# Patient Record
Sex: Male | Born: 1945 | Race: White | Hispanic: No | Marital: Married | Smoking: Former smoker
Health system: Southern US, Community
[De-identification: ages and names within clinical notes are randomized; demographics above are authoritative.]

## PROBLEM LIST (undated history)

## (undated) DIAGNOSIS — I10 Essential (primary) hypertension: Secondary | ICD-10-CM

## (undated) DIAGNOSIS — F329 Major depressive disorder, single episode, unspecified: Secondary | ICD-10-CM

## (undated) DIAGNOSIS — F4310 Post-traumatic stress disorder, unspecified: Secondary | ICD-10-CM

## (undated) DIAGNOSIS — Z95810 Presence of automatic (implantable) cardiac defibrillator: Secondary | ICD-10-CM

## (undated) DIAGNOSIS — F419 Anxiety disorder, unspecified: Secondary | ICD-10-CM

## (undated) DIAGNOSIS — J449 Chronic obstructive pulmonary disease, unspecified: Secondary | ICD-10-CM

## (undated) DIAGNOSIS — R251 Tremor, unspecified: Secondary | ICD-10-CM

## (undated) DIAGNOSIS — N400 Enlarged prostate without lower urinary tract symptoms: Secondary | ICD-10-CM

## (undated) DIAGNOSIS — F32A Depression, unspecified: Secondary | ICD-10-CM

## (undated) DIAGNOSIS — R519 Headache, unspecified: Secondary | ICD-10-CM

## (undated) DIAGNOSIS — K219 Gastro-esophageal reflux disease without esophagitis: Secondary | ICD-10-CM

## (undated) DIAGNOSIS — R51 Headache: Secondary | ICD-10-CM

## (undated) DIAGNOSIS — E785 Hyperlipidemia, unspecified: Secondary | ICD-10-CM

## (undated) HISTORY — PX: BACK SURGERY: SHX140

## (undated) HISTORY — PX: HEMORRHOID SURGERY: SHX153

## (undated) HISTORY — PX: NECK SURGERY: SHX720

## (undated) HISTORY — PX: CATARACT EXTRACTION W/ INTRAOCULAR LENS  IMPLANT, BILATERAL: SHX1307

## (undated) HISTORY — PX: EYE MUSCLE SURGERY: SHX370

## (undated) HISTORY — PX: HERNIA REPAIR: SHX51

## (undated) HISTORY — DX: Presence of automatic (implantable) cardiac defibrillator: Z95.810

---

## 2003-10-01 ENCOUNTER — Other Ambulatory Visit: Payer: Self-pay

## 2003-10-04 ENCOUNTER — Other Ambulatory Visit: Payer: Self-pay

## 2004-02-13 ENCOUNTER — Emergency Department: Payer: Self-pay | Admitting: Internal Medicine

## 2004-02-27 ENCOUNTER — Ambulatory Visit: Payer: Self-pay

## 2004-06-13 ENCOUNTER — Ambulatory Visit: Payer: Self-pay | Admitting: Surgery

## 2004-06-19 ENCOUNTER — Ambulatory Visit: Payer: Self-pay | Admitting: Surgery

## 2004-06-20 ENCOUNTER — Emergency Department: Payer: Self-pay | Admitting: Unknown Physician Specialty

## 2004-09-29 ENCOUNTER — Ambulatory Visit: Payer: Self-pay | Admitting: General Surgery

## 2004-11-27 ENCOUNTER — Ambulatory Visit: Payer: Self-pay | Admitting: General Surgery

## 2004-12-02 ENCOUNTER — Ambulatory Visit: Payer: Self-pay | Admitting: General Surgery

## 2006-12-01 ENCOUNTER — Ambulatory Visit: Payer: Self-pay | Admitting: Unknown Physician Specialty

## 2007-07-05 ENCOUNTER — Other Ambulatory Visit: Payer: Self-pay

## 2007-07-05 ENCOUNTER — Ambulatory Visit: Payer: Self-pay | Admitting: General Surgery

## 2007-07-11 ENCOUNTER — Ambulatory Visit: Payer: Self-pay | Admitting: General Surgery

## 2008-09-07 ENCOUNTER — Ambulatory Visit: Payer: Self-pay | Admitting: Specialist

## 2009-01-16 ENCOUNTER — Ambulatory Visit: Payer: Self-pay | Admitting: Specialist

## 2009-05-15 ENCOUNTER — Ambulatory Visit: Payer: Self-pay | Admitting: Specialist

## 2009-11-09 ENCOUNTER — Observation Stay: Payer: Self-pay | Admitting: *Deleted

## 2009-11-20 ENCOUNTER — Ambulatory Visit: Payer: Self-pay | Admitting: Specialist

## 2010-03-04 ENCOUNTER — Inpatient Hospital Stay: Payer: Self-pay | Admitting: Internal Medicine

## 2010-10-13 ENCOUNTER — Ambulatory Visit: Payer: Self-pay | Admitting: Unknown Physician Specialty

## 2010-10-14 LAB — PATHOLOGY REPORT

## 2010-11-20 ENCOUNTER — Ambulatory Visit: Payer: Self-pay | Admitting: Specialist

## 2011-02-01 ENCOUNTER — Ambulatory Visit: Payer: Self-pay

## 2011-06-04 ENCOUNTER — Inpatient Hospital Stay: Payer: Self-pay | Admitting: Internal Medicine

## 2011-06-04 ENCOUNTER — Ambulatory Visit: Payer: Self-pay | Admitting: Internal Medicine

## 2011-06-04 LAB — BASIC METABOLIC PANEL
Anion Gap: 11 (ref 7–16)
Calcium, Total: 9.3 mg/dL (ref 8.5–10.1)
Co2: 24 mmol/L (ref 21–32)
EGFR (African American): >60
EGFR (Non-African Amer.): 52 — ABNORMAL LOW
Osmolality: 276 (ref 275–301)

## 2011-06-04 LAB — CBC WITH DIFFERENTIAL/PLATELET
Eosinophil %: 1.7 %
HGB: 13 g/dL (ref 13.0–18.0)
Lymphocyte #: 1.1 10*3/uL (ref 1.0–3.6)
MCH: 32.1 pg (ref 26.0–34.0)
MCV: 98 fL (ref 80–100)
Monocyte #: 0.5 10*3/uL (ref 0.0–0.7)
Neutrophil #: 7.1 10*3/uL — ABNORMAL HIGH (ref 1.4–6.5)
Platelet: 190 10*3/uL (ref 150–440)
RBC: 4.04 10*6/uL — ABNORMAL LOW (ref 4.40–5.90)
RDW: 13.3 % (ref 11.5–14.5)
WBC: 8.8 10*3/uL (ref 3.8–10.6)

## 2011-06-04 LAB — URINALYSIS, COMPLETE
Bilirubin,UR: NEGATIVE
Ketone: NEGATIVE
Nitrite: NEGATIVE
Ph: 6 (ref 4.5–8.0)

## 2011-06-04 LAB — CK: CK, Total: 597 U/L — ABNORMAL HIGH (ref 35–232)

## 2011-06-04 LAB — COMPREHENSIVE METABOLIC PANEL
Anion Gap: 13 (ref 7–16)
BUN: 14 mg/dL (ref 7–18)
Bilirubin,Total: 0.6 mg/dL (ref 0.2–1.0)
Calcium, Total: 9.2 mg/dL (ref 8.5–10.1)
Chloride: 101 mmol/L (ref 98–107)
Creatinine: 1.37 mg/dL — ABNORMAL HIGH (ref 0.60–1.30)
EGFR (African American): >60
EGFR (Non-African Amer.): 55 — ABNORMAL LOW
SGOT(AST): 39 U/L — ABNORMAL HIGH (ref 15–37)
SGPT (ALT): 20 U/L
Total Protein: 7.3 g/dL (ref 6.4–8.2)

## 2011-06-04 LAB — CK TOTAL AND CKMB (NOT AT ARMC)
CK, Total: 1486 U/L — ABNORMAL HIGH (ref 35–232)
CK-MB: 10.3 ng/mL — ABNORMAL HIGH (ref 0.5–3.6)

## 2011-06-04 LAB — TROPONIN I: Troponin-I: <0.02 ng/mL

## 2011-06-05 LAB — BASIC METABOLIC PANEL
Anion Gap: 18 — ABNORMAL HIGH (ref 7–16)
Calcium, Total: 9.2 mg/dL (ref 8.5–10.1)
Co2: 20 mmol/L — ABNORMAL LOW (ref 21–32)
EGFR (African American): 48 — ABNORMAL LOW
EGFR (Non-African Amer.): 39 — ABNORMAL LOW
Glucose: 200 mg/dL — ABNORMAL HIGH (ref 65–99)
Osmolality: 284 (ref 275–301)
Potassium: 4.5 mmol/L (ref 3.5–5.1)

## 2011-06-05 LAB — CBC WITH DIFFERENTIAL/PLATELET
Basophil #: 0 10*3/uL (ref 0.0–0.1)
HCT: 36.3 % — ABNORMAL LOW (ref 40.0–52.0)
HGB: 11.9 g/dL — ABNORMAL LOW (ref 13.0–18.0)
Lymphocyte #: 0.7 10*3/uL — ABNORMAL LOW (ref 1.0–3.6)
MCH: 31.9 pg (ref 26.0–34.0)
MCHC: 32.9 g/dL (ref 32.0–36.0)
MCV: 97 fL (ref 80–100)
Monocyte #: 0.2 10*3/uL (ref 0.0–0.7)
Monocyte %: 1.4 %
Neutrophil #: 10.8 10*3/uL — ABNORMAL HIGH (ref 1.4–6.5)
Platelet: 167 10*3/uL (ref 150–440)
RDW: 13.9 % (ref 11.5–14.5)

## 2011-06-05 LAB — CBC
MCH: 32.5 pg (ref 26.0–34.0)
MCHC: 34.1 g/dL (ref 32.0–36.0)
MCV: 95 fL (ref 80–100)
RBC: 3.84 10*6/uL — ABNORMAL LOW (ref 4.40–5.90)
RDW: 13.7 % (ref 11.5–14.5)

## 2011-06-05 LAB — TROPONIN I: Troponin-I: <0.02 ng/mL

## 2011-06-05 LAB — CK TOTAL AND CKMB (NOT AT ARMC)
CK, Total: 2267 U/L — ABNORMAL HIGH (ref 35–232)
CK-MB: 17.9 ng/mL — ABNORMAL HIGH (ref 0.5–3.6)

## 2011-06-05 LAB — URINE CULTURE

## 2011-06-06 LAB — BASIC METABOLIC PANEL
Anion Gap: 15 (ref 7–16)
BUN: 30 mg/dL — ABNORMAL HIGH (ref 7–18)
Co2: 23 mmol/L (ref 21–32)
Creatinine: 1.75 mg/dL — ABNORMAL HIGH (ref 0.60–1.30)
EGFR (African American): 50 — ABNORMAL LOW
Glucose: 124 mg/dL — ABNORMAL HIGH (ref 65–99)
Sodium: 137 mmol/L (ref 136–145)

## 2011-06-07 LAB — BASIC METABOLIC PANEL
Anion Gap: 13 (ref 7–16)
Calcium, Total: 9.2 mg/dL (ref 8.5–10.1)
Chloride: 101 mmol/L (ref 98–107)
Co2: 24 mmol/L (ref 21–32)
EGFR (African American): >60
EGFR (Non-African Amer.): >60
Glucose: 97 mg/dL (ref 65–99)
Osmolality: 280 (ref 275–301)
Potassium: 3.6 mmol/L (ref 3.5–5.1)
Sodium: 138 mmol/L (ref 136–145)

## 2011-09-15 ENCOUNTER — Ambulatory Visit: Payer: Self-pay | Admitting: Nephrology

## 2011-12-10 DIAGNOSIS — J449 Chronic obstructive pulmonary disease, unspecified: Secondary | ICD-10-CM | POA: Insufficient documentation

## 2011-12-10 DIAGNOSIS — M109 Gout, unspecified: Secondary | ICD-10-CM | POA: Insufficient documentation

## 2011-12-10 DIAGNOSIS — Z9889 Other specified postprocedural states: Secondary | ICD-10-CM | POA: Insufficient documentation

## 2011-12-10 DIAGNOSIS — K635 Polyp of colon: Secondary | ICD-10-CM | POA: Insufficient documentation

## 2011-12-17 DIAGNOSIS — F4310 Post-traumatic stress disorder, unspecified: Secondary | ICD-10-CM | POA: Insufficient documentation

## 2011-12-17 DIAGNOSIS — Z7689 Persons encountering health services in other specified circumstances: Secondary | ICD-10-CM | POA: Insufficient documentation

## 2011-12-17 DIAGNOSIS — Z0189 Encounter for other specified special examinations: Secondary | ICD-10-CM | POA: Insufficient documentation

## 2011-12-17 DIAGNOSIS — K219 Gastro-esophageal reflux disease without esophagitis: Secondary | ICD-10-CM | POA: Insufficient documentation

## 2012-04-06 ENCOUNTER — Ambulatory Visit: Payer: Self-pay | Admitting: Family Medicine

## 2012-04-18 ENCOUNTER — Emergency Department: Payer: Self-pay | Admitting: Emergency Medicine

## 2012-04-18 LAB — COMPREHENSIVE METABOLIC PANEL
Albumin: 4.1 g/dL (ref 3.4–5.0)
Alkaline Phosphatase: 44 U/L — ABNORMAL LOW (ref 50–136)
Anion Gap: 11 (ref 7–16)
BUN: 33 mg/dL — ABNORMAL HIGH (ref 7–18)
Calcium, Total: 9.3 mg/dL (ref 8.5–10.1)
EGFR (African American): 28 — ABNORMAL LOW
EGFR (Non-African Amer.): 24 — ABNORMAL LOW
Glucose: 112 mg/dL — ABNORMAL HIGH (ref 65–99)
SGOT(AST): 35 U/L (ref 15–37)
SGPT (ALT): 22 U/L (ref 12–78)
Sodium: 135 mmol/L — ABNORMAL LOW (ref 136–145)
Total Protein: 7.9 g/dL (ref 6.4–8.2)

## 2012-04-18 LAB — CBC
HCT: 44.3 % (ref 40.0–52.0)
MCH: 33.2 pg (ref 26.0–34.0)
MCHC: 35 g/dL (ref 32.0–36.0)
Platelet: 268 10*3/uL (ref 150–440)
RDW: 13.2 % (ref 11.5–14.5)

## 2012-04-19 LAB — URINALYSIS, COMPLETE
Bilirubin,UR: NEGATIVE
Blood: NEGATIVE
Glucose,UR: 50 mg/dL (ref 0–75)
RBC,UR: 1 /HPF (ref 0–5)
Squamous Epithelial: <1
WBC UR: 4 /HPF (ref 0–5)

## 2012-06-08 ENCOUNTER — Ambulatory Visit: Payer: Self-pay | Admitting: Nephrology

## 2012-06-23 DIAGNOSIS — Z760 Encounter for issue of repeat prescription: Secondary | ICD-10-CM | POA: Insufficient documentation

## 2012-08-26 ENCOUNTER — Ambulatory Visit: Payer: Self-pay | Admitting: Emergency Medicine

## 2012-12-28 DIAGNOSIS — E039 Hypothyroidism, unspecified: Secondary | ICD-10-CM | POA: Insufficient documentation

## 2013-03-13 ENCOUNTER — Ambulatory Visit: Payer: Self-pay | Admitting: Physician Assistant

## 2013-08-21 ENCOUNTER — Ambulatory Visit: Payer: Self-pay | Admitting: Gastroenterology

## 2013-09-01 ENCOUNTER — Ambulatory Visit: Payer: Self-pay | Admitting: Gastroenterology

## 2013-09-04 LAB — PATHOLOGY REPORT

## 2013-12-28 DIAGNOSIS — R0902 Hypoxemia: Secondary | ICD-10-CM | POA: Insufficient documentation

## 2014-01-25 DIAGNOSIS — N183 Chronic kidney disease, stage 3 unspecified: Secondary | ICD-10-CM | POA: Insufficient documentation

## 2014-01-25 DIAGNOSIS — E782 Mixed hyperlipidemia: Secondary | ICD-10-CM | POA: Insufficient documentation

## 2014-01-25 DIAGNOSIS — G8929 Other chronic pain: Secondary | ICD-10-CM | POA: Insufficient documentation

## 2014-01-25 DIAGNOSIS — J309 Allergic rhinitis, unspecified: Secondary | ICD-10-CM | POA: Insufficient documentation

## 2014-01-25 DIAGNOSIS — M549 Dorsalgia, unspecified: Secondary | ICD-10-CM

## 2014-01-25 DIAGNOSIS — R251 Tremor, unspecified: Secondary | ICD-10-CM | POA: Insufficient documentation

## 2014-01-25 DIAGNOSIS — I10 Essential (primary) hypertension: Secondary | ICD-10-CM | POA: Insufficient documentation

## 2014-01-25 DIAGNOSIS — K529 Noninfective gastroenteritis and colitis, unspecified: Secondary | ICD-10-CM | POA: Insufficient documentation

## 2014-03-20 DIAGNOSIS — I5022 Chronic systolic (congestive) heart failure: Secondary | ICD-10-CM | POA: Insufficient documentation

## 2014-03-20 DIAGNOSIS — I272 Pulmonary hypertension, unspecified: Secondary | ICD-10-CM | POA: Insufficient documentation

## 2014-08-26 NOTE — H&P (Signed)
PATIENT NAME:  Robert Ball, Robert Ball MR#:  [id number] DATE OF BIRTH:  22-Sep-1945  DATE OF ADMISSION:  06/04/2011  PRIMARY CARE PHYSICIAN: Cay Schillings, MD  HISTORY OF PRESENT ILLNESS: The patient is a 69 year old Caucasian male with past medical history significant for history of end-stage chronic obstructive pulmonary disease who presented to the hospital with complaints of severe left flank pains. According to the patient, he was doing well up until approximately two days ago when he turned in the bed and suddenly started having left flank pain radiating around his left chest lower chest area into his left upper quadrant. The pain is excruciating, stays all the time, and increases whenever takes deep breath. He is also complaining of increasing shortness of breath. He denies any significant sputum production, or cough, or swelling in his lower extremities. On arrival to the Emergency Room, he was noted to have an unremarkable chest x-ray, and because of his hypoxemia as well as pleuritic chest pains, a VQ is planned. Hospitalist Services were contacted for admission.   PAST MEDICAL/SURGICAL HISTORY: Significant for: 1. History of hypertension.  2. Chronic obstructive pulmonary disease, on oxygen at 2 liters inhalation nasal cannula.  3. History of cardiomyopathy with an ejection fraction of 35%, status post defibrillator and pacemaker placement.  4. History of hyperlipidemia.  5. History of admission for acute on chronic respiratory failure in November 2011 due to chronic obstructive pulmonary disease exacerbation, history of leukocytosis, hypokalemia, being followed by Dr. Meredeth Ide. History of lung nodules in the past.  6. History of degenerative disk disease and three surgeries on his spine.   ALLERGIES: Aspirin, Cipro, tetracycline, OxyContin, Motrin, Macrodantin, sulfa, and metronidazole.   MEDICATIONS: According to the patient's medical records the patient is on: 1. Carvedilol 12.25 mg p. b.i.d.    2. Amlodipine 10 mg p.o. daily.  3. Lisinopril 10 mg p.o. daily.  4. Sertraline 100 mg p.o. b.i.d.   5. Tamsulosin 0.4 mg once daily. 6. Simvastatin 10 mg p.o. daily.  7. Ziprasidone 80 mg p.o. at bedtime.  8. Omeprazole 10 mg p.o. at bedtime.  9. Symbicort 160/4.5, 2 puffs b.i.d.   10. Spiriva once daily.  11. Nebulizer with albuterol q.i.d. as needed.  12. Indomethacin 25 mg p.o. q.i.d.  13. Oxygen 2 liters through nasal cannula at bedtime.  14. Stool softeners when needed.  15. Tylenol 500 mg p.o. daily as needed.  16. Tramadol 50 mg, 2 tablets b.i.d. as needed.  17. Colcrys 0.6 mg p.o. daily.  18. Allopurinol 300 mg p.o. daily.   SOCIAL HISTORY: On and off smoking 1 pack daily for a long time. No alcohol abuse.   FAMILY HISTORY: Coronary artery disease.   REVIEW OF SYSTEMS: Negative for fevers, chills, fatigue, weakness. Admits of having pleuritic pains in the left side of his lower chest area. CONSTITUTIONAL: No  weight loss or  gain. EYES: In regards to eyes, denies any blurry vision, double vision, glaucoma, or cataracts. ENT: Denies any tinnitus, allergies, epistaxis, sinus pain, dentures, difficulty swallowing. CARDIORESPIRATORY: Denies any cough, wheezes, asthma, or chronic obstructive pulmonary disease. Mild shortness of breath, worse since two days ago but only due to painful respirations. Some mild edema around his feet and ankles. Otherwise, no anterior chest pains, no orthopnea or arrhythmias, palpitations or syncope. GASTROINTESTINAL: Denies nausea, vomiting, diarrhea, or constipation. GENITOURINARY: Denies dysuria, hematuria, frequency, or incontinence. ENDOCRINOLOGY: Denies any polydipsia, nocturia, thyroid problems, heat or cold intolerance or thirst. HEMATOLOGIC: Denies any anemia, easy bruisability, bleeding, or swollen  glands. SKIN: Denies any acne, rashes, lesions, or change in moles. MUSCULOSKELETAL: Denies arthritis, cramps, swelling. NEUROLOGICAL: No numbness,  epilepsy, or tremor. PSYCHIATRIC: Denies anxiety or insomnia.    PHYSICAL EXAMINATION:  VITAL SIGNS: On arrival to the hospital, temperature is not measured, unfortunately. Pulse 61, respiration rate 22, blood pressure 126/73, saturation 99% on oxygen therapy.   GENERAL: This is a well-developed, well-nourished, mildly obese Caucasian male in no significant distress, moderately uncomfortable whenever he moves around or takes deep breaths. That is when he is tachypneic and has shallow respirations,.   HEENT: His pupils are equal and reactive to light. Extraocular movements are intact. No icterus or conjunctivitis.  He has normal hearing. No pharyngeal erythema. Mucosa is moist.   NECK: Neck did not reveal any masses, supple, nontender. Thyroid is not enlarged. No adenopathy. No JVD or carotid bruits bilaterally. Full range of motion.   LUNGS: Clear to auscultation anteriorly, somewhat diminished breath sounds posteriorly as well as crackles were heard bilaterally at the bases, more on the right side. No rhonchi or wheezing was noted, somewhat labored inspirations as well as increased effort to breathe, and not able to take deep breaths due to painful deep breaths. Mild-to-moderate moderate respiratory distress.   CARDIOVASCULAR: S1, S2 appreciated. No murmurs, gallops or rubs are noted. PMI is not lateralized.  The patient's chest is minimally tender to palpation. Trace lower extremity edema, 1+ pedal pulses. No calf tenderness or cyanosis. Pulses were palpable bilaterally distally in the lower extremities.   ABDOMEN: Protuberant, soft, however tender in the left upper quadrant as well as left flank area. No hepatosplenomegaly or masses were noted.   RECTAL: Deferred.   MUSCULOSKELETAL: Muscle strength: Able to move all extremities. No cyanosis, degenerative joint disease, or kyphosis. The patient does have significant discomfort, tenderness on palpation of his thoracic spine, mid as well as lower  spine.   SKIN: Skin did not reveal any rashes, lesions, erythema, nodularity, or induration. It was warm and dry to palpation. No rashes corresponding to dermatome distribution were noted on the left side.   LYMPH: No adenopathy in the cervical region.   NEUROLOGICAL: Cranial nerves are grossly intact. Sensory is intact. No dysarthria or aphasia.   PSYCHIATRIC: The patient is alert, oriented to time, person, and place, cooperative. Memory is good. No significant confusion, agitation, or depression noted.  LABORATORY, DIAGNOSTIC AND RADIOLOGICAL DATA:  BMP showed glucose of 97, creatinine 1.43, otherwise unremarkable study. Estimated GFR for non-African American would be 52. Liver enzymes showed AST slight elevation to 39, otherwise unremarkable study.  The patient's cardiac enzymes, troponin less than 0.02.  White blood cell count is normal at 8.9, hemoglobin 12.5, platelets 77; however,  early morning lab CBC did not show any abnormalities in his platelet count-his platelet count was 190, and his hemoglobin level was normal at 13.0.  Urinalysis is pending.  EKG showed AV sequential or dual-chamber electronic pacemaker at rate of 60 beats per minute, left axis deviation,  nonspecific ST-T changes.  Chest x-ray, portable single view on 06/04/2011 revealed minimal left lung base atelectasis.   ASSESSMENT AND PLAN:  1. Pleuritic chest pain with associated shortness of breath: Unclear etiology at this time, concerning for pulmonary embolism. We will get VQ scan later today. We will also get pain medications as needed for pain management. It is still possible that the patient has degenerative disk disease and even compression vertebral fracture-related pain. For this reason, we are going to get a thoracic spine  AP and lateral x-ray to rule out fractures. Again, as mentioned above, we will continue oxygen therapy as well as pain medications as needed. We will get Physical Therapy involved for ambulation  and strengthening.  2. Renal insufficiency: Seems to be worse since before. Get urinalysis to rule out urinary tract infection  3. Mild elevation of transaminases, AST: Get CK total to rule out rhabdomyolysis.  4. Anemia: Get guaiac.  5. Thrombocytopenia: Follow along.  6. History of cardiomyopathy with ejection fraction of 35% and crackles at the lung bases: Concerning for fluid retention. We will give one dose of Lasix while the patient is in the Emergency Room.  7. History of hypertension: Continue outpatient medications.  8. History of chronic obstructive pulmonary disease: Seems to be stable. Continue outpatient medications  9. History of depression: Continue sertraline.  10. History of benign prostatic hypertrophy: Continue on tamsulosin.  11. History of gastroesophageal reflux disease: Omeprazole.  12. History of gout: We will hold Colcrys due to the patient's renal insufficiency. We will continue allopurinol at markedly decreased dosage at 100, get uric acid level.  13. Degenerative disease: Continue Ultram, add also Lidoderm.   TIME SPENT: One hour.  ____________________________ Katharina Caper, MD rv:cbb D: 06/04/2011 17:16:05 ET T: 06/04/2011 19:08:37 ET JOB#: [id number]  cc: Katharina Caper, MD, <Dictator> Mickie Hillier. Sheppard Penton, MD Katharina Caper MD ELECTRONICALLY SIGNED 06/14/2011 20:25

## 2014-08-26 NOTE — Consult Note (Signed)
Brief Consult Note: Diagnosis: Left lower lumbar pain.   Patient was seen by consultant.   Recommend further assessment or treatment.   Orders entered.   Comments: 69 year old male turned over in bed 3 days ago and developed left lower lumbar pain.  Has had pain with movement and breathing along with other medical problems.  Admitted for pain control.    Exam:  Alert, cooperative, on O2.  Able to sit up with some pain.  Tender over left lower lumbar region.  No swelling or bruising.  straight leg raise negative.  circulation/sensation/motor function good distally.    X-rays:  Thoracic films show some old changes T11   T12  with minimal compression  Imp:  left lower lumbar pain  treatment:  X-rays of lumbar spine                 continue pain control                 mobilize as tolerated.  Electronic Signatures: Valinda HoarMiller, Enrrique Mierzwa E (MD)  (Signed [phone]-Feb-13 12:00)  Authored: Brief Consult Note   Last Updated: 02-Feb-13 12:00 by Valinda HoarMiller, Easton Sivertson E (MD)

## 2014-08-26 NOTE — Discharge Summary (Signed)
PATIENT NAME:  Robert Ball, Robert R MR#:  [id number] DATE OF BIRTH:  May 11, 1945  DATE OF ADMISSION:  06/04/2011 DATE OF DISCHARGE:  06/07/2011  DISCHARGE DIAGNOSES:  1. Acute low back pain because of musculoskeletal strain.  2. Acute renal failure, resolved. 3. Hypertension.  4. Systolic heart failure with cardiomyopathy, status post defibrillator.  5. End-stage chronic obstructive pulmonary disease.   DISCHARGE MEDICATIONS:  1. Spiriva 18 mcg inhalation daily.  2. Simvastatin 20 mg p.o. daily.  3. Coreg 12.5 mg twice a day. 4. Lisinopril 20 mg daily. 5. Colace 100 mg p.o. twice a day. 6. Symbicort 160/4.5 two puffs twice a day. 7. DuoNebs every 4 hours. 8. Omeprazole 40 mg p.o. daily. 9. Amlodipine 10 mg p.o. daily.  10. Tramadol 50 mg twice a day as needed for pain.  11. Tylenol. 12. Allopurinol 300 mg p.o. daily.  13. Colchicine 0.6 mg p.o. daily.  14. Zoloft 100 mg p.o. twice a day. 15. Lasix 40 mg p.o. daily.  16. KCl 20 mEq p.o. daily.  17. Lidoderm patch to affected area daily.   New Medications: 1. Lidocaine patch to affected area daily and remove the patch at night.  2. Augmentin 875 mg p.o. twice a day for seven days.   STOP THE FOLLOWING MEDICATIONS: Stop Indocin because of acute kidney failure.  OXYGEN: 3 liters per nasal cannula.   DIET: Low sodium diet.   DISCHARGE FOLLOWUP: Followup with Dr. Sheppard PentonWolf, the patient's primary care physician, in 1 to 2 weeks.   CONSULTANTS: Deeann SaintHoward Miller, MD - Orthopedics.  HOSPITAL COURSE: This is a 69 year old male with end-stage chronic obstructive pulmonary disease, systolic heart failure, and history of hyperlipidemia who came in because of back pain. The patient suddenly rolled over in the bed and then started to have back pain, mainly in the upper thoracic area and mid thoracic area. The patient also had pain radiating to the left chest and upper quadrant of the abdomen. Please look at the History and Physical for full details.  The patient was admitted for chest pain with trouble breathing and had a V/Q scan done which did not show any pulmonary emboli. The patient's main complaint was back pain, not the chest pain, but he was ruled out for myocardial infarction. V/Q scan did not show any pulmonary embolus. X-ray of the thoracic spine showed T11 and T12 vertebral body height loss. Dr. Deeann SaintHoward Miller saw the patient and recommended physical therapy. The patient also had lumbar spine x-ray which did not show any acute changes. The patient was able to walk without the help of physical therapy. He got pain relief with Lidoderm patch and wanted to go home with a Lidoderm patch. He is discharged home in stable condition. Dr. Hyacinth MeekerMiller suggested the T11-T12 compression is old. The patient has no other neurological deficits, no sensory or motor deficits, and straight leg raising test was negative. Regarding his endstage chronic obstructive pulmonary disease, he was continued on 3 liters of oxygen. On admission chest x-ray did not show any acute cardiopulmonary disease. Urine cultures did not show any growth. BUN was 14 and creatinine was slightly elevated at 1.37 on admission. The patient's BUN and creatinine actually went up to 25 and creatinine 1.84 on 06/05/2011. At that time we held the Lasix and lisinopril. BUN and creatinine improved on 06/06/2011. On 06/06/2010 it improved to a BUN of 26 and creatinine 1.24. I advised the patient not to take Indocin and follow-up with his primary doctor regarding that. The patient  has a history of systolic heart failure and is status post defibrillator. He did not have any heart failure symptoms. During the hospital stay, he was on Coreg. We continued that. Of course Lasix and lisinopril were held because of renal failure, but told him he can resume        them when he goes home. The patient was taking lisinopril and Coreg. The patient's condition at the time of discharge is stable.   TIME SPENT TIME  ON DISCHARGE PREPARATION: More than 30 minutes.  ____________________________ Katha Hamming, MD sk:slb D: 06/10/2011 14:17:49 ET T: 06/10/2011 15:10:00 ET JOB#: [id number]  cc: Katha Hamming, MD, <Dictator> Mickie Hillier. Sheppard Penton, MD Katha Hamming MD ELECTRONICALLY SIGNED 06/22/2011 16:23

## 2014-12-25 ENCOUNTER — Ambulatory Visit
Admission: RE | Admit: 2014-12-25 | Discharge: 2014-12-25 | Disposition: A | Discharge status: Home / Self Care | Payer: Medicare PPO | Source: Ambulatory Visit | Attending: Cardiology | Admitting: Cardiology

## 2014-12-25 ENCOUNTER — Encounter
Admission: RE | Admit: 2014-12-25 | Discharge: 2014-12-25 | Disposition: A | Discharge status: Home / Self Care | Payer: Medicare PPO | Source: Ambulatory Visit | Attending: Cardiology | Admitting: Cardiology

## 2014-12-25 DIAGNOSIS — J449 Chronic obstructive pulmonary disease, unspecified: Secondary | ICD-10-CM

## 2014-12-25 HISTORY — DX: Chronic obstructive pulmonary disease, unspecified: J44.9

## 2014-12-25 HISTORY — DX: Tremor, unspecified: R25.1

## 2014-12-25 HISTORY — DX: Gastro-esophageal reflux disease without esophagitis: K21.9

## 2014-12-25 HISTORY — DX: Anxiety disorder, unspecified: F41.9

## 2014-12-25 HISTORY — DX: Headache: R51

## 2014-12-25 HISTORY — DX: Post-traumatic stress disorder, unspecified: F43.10

## 2014-12-25 HISTORY — DX: Essential (primary) hypertension: I10

## 2014-12-25 HISTORY — DX: Hyperlipidemia, unspecified: E78.5

## 2014-12-25 HISTORY — DX: Depression, unspecified: F32.A

## 2014-12-25 HISTORY — DX: Headache, unspecified: R51.9

## 2014-12-25 HISTORY — DX: Major depressive disorder, single episode, unspecified: F32.9

## 2014-12-25 HISTORY — DX: Benign prostatic hyperplasia without lower urinary tract symptoms: N40.0

## 2014-12-25 LAB — BASIC METABOLIC PANEL
Anion gap: 9 (ref 5–15)
BUN: 13 mg/dL (ref 6–20)
CALCIUM: 9.2 mg/dL (ref 8.9–10.3)
CO2: 26 mmol/L (ref 22–32)
CREATININE: 1.9 mg/dL — AB (ref 0.61–1.24)
Chloride: 103 mmol/L (ref 101–111)
GFR calc Af Amer: 39 mL/min — ABNORMAL LOW (ref >60)
GFR calc non Af Amer: 34 mL/min — ABNORMAL LOW (ref >60)
GLUCOSE: 139 mg/dL — AB (ref 65–99)
Potassium: 3.9 mmol/L (ref 3.5–5.1)
Sodium: 138 mmol/L (ref 135–145)

## 2014-12-25 LAB — CBC
HEMATOCRIT: 42.3 % (ref 40.0–52.0)
Hemoglobin: 14.2 g/dL (ref 13.0–18.0)
MCH: 30.5 pg (ref 26.0–34.0)
MCHC: 33.6 g/dL (ref 32.0–36.0)
MCV: 90.8 fL (ref 80.0–100.0)
Platelets: 154 10*3/uL (ref 150–440)
RBC: 4.66 MIL/uL (ref 4.40–5.90)
RDW: 14.1 % (ref 11.5–14.5)
WBC: 8.8 10*3/uL (ref 3.8–10.6)

## 2014-12-25 LAB — APTT: APTT: 25 s (ref 24–36)

## 2014-12-25 LAB — PROTIME-INR
INR: 1.07
Prothrombin Time: 14.1 seconds (ref 11.4–15.0)

## 2014-12-25 NOTE — Pre-Procedure Instructions (Signed)
Patient notified of need for additional lab and chest x-ray.

## 2014-12-25 NOTE — Pre-Procedure Instructions (Addendum)
Called for orders, Dr Darrold Junker office.

## 2014-12-25 NOTE — Patient Instructions (Signed)
  Your procedure is scheduled on: 12/27/14 Thurs Report to Day Surgery. To find out your arrival time please call [phone] between 1PM - 3PM on 12/26/14 Wed.  Remember: Instructions that are not followed completely may result in serious medical risk, up to and including death, or upon the discretion of your surgeon and anesthesiologist your surgery may need to be rescheduled.    _x___ 1. Do not eat food or drink liquids after midnight. No gum chewing or hard candies.     _x___ 2. No Alcohol for 24 hours before or after surgery.   ____ 3. Bring all medications with you on the day of surgery if instructed.    __x__ 4. Notify your doctor if there is any change in your medical condition     (cold, fever, infections).     Do not wear jewelry, make-up, hairpins, clips or nail polish.  Do not wear lotions, powders, or perfumes. You may wear deodorant.  Do not shave 48 hours prior to surgery. Men may shave face and neck.  Do not bring valuables to the hospital.    [facility] is not responsible for any belongings or valuables.               Contacts, dentures or bridgework may not be worn into surgery.  Leave your suitcase in the car. After surgery it may be brought to your room.  For patients admitted to the hospital, discharge time is determined by your                treatment team.   Patients discharged the day of surgery will not be allowed to drive home.   Please read over the following fact sheets that you were given:      _x___ Take these medicines the morning of surgery with A SIP OF WATER:    1. albuterol (PROVENTIL HFA;VENTOLIN HFA) 108 (90 BASE) MCG/ACT inhaler  2. budesonide-formoterol (SYMBICORT) 160-4.5 MCG/ACT inhaler  3. carvedilol (COREG) 12.5 MG tablet  4.omeprazole (PRILOSEC) 20 MG capsule  5.fenofibrate 160 MG tablet  6.sertraline (ZOLOFT) 100 MG tablet  7.tiotropium (SPIRIVA) 18 MCG inhalation capsule  ____ Fleet Enema (as directed)   _x___ Use CHG Soap as  directed  _x___ Use inhalers on the day of surgery  ____ Stop metformin 2 days prior to surgery    ____ Take 1/2 of usual insulin dose the night before surgery and none on the morning of surgery.   ____ Stop Coumadin/Plavix/aspirin on   ____ Stop Anti-inflammatories on    ____ Stop supplements until after surgery.    ____ Bring C-Pap to the hospital.

## 2014-12-27 ENCOUNTER — Ambulatory Visit: Payer: Medicare PPO | Admitting: Anesthesiology

## 2014-12-27 ENCOUNTER — Encounter
Admission: RE | Disposition: A | Discharge status: Home / Self Care | Payer: Self-pay | Source: Ambulatory Visit | Attending: Cardiology

## 2014-12-27 ENCOUNTER — Encounter: Payer: Self-pay | Admitting: Cardiology

## 2014-12-27 ENCOUNTER — Ambulatory Visit
Admission: RE | Admit: 2014-12-27 | Discharge: 2014-12-27 | Disposition: A | Discharge status: Home / Self Care | Payer: Medicare PPO | Source: Ambulatory Visit | Attending: Cardiology | Admitting: Cardiology

## 2014-12-27 DIAGNOSIS — J449 Chronic obstructive pulmonary disease, unspecified: Secondary | ICD-10-CM | POA: Insufficient documentation

## 2014-12-27 DIAGNOSIS — I509 Heart failure, unspecified: Secondary | ICD-10-CM | POA: Diagnosis present

## 2014-12-27 DIAGNOSIS — I5022 Chronic systolic (congestive) heart failure: Secondary | ICD-10-CM | POA: Diagnosis not present

## 2014-12-27 DIAGNOSIS — E785 Hyperlipidemia, unspecified: Secondary | ICD-10-CM | POA: Diagnosis not present

## 2014-12-27 DIAGNOSIS — K219 Gastro-esophageal reflux disease without esophagitis: Secondary | ICD-10-CM | POA: Insufficient documentation

## 2014-12-27 DIAGNOSIS — F418 Other specified anxiety disorders: Secondary | ICD-10-CM | POA: Insufficient documentation

## 2014-12-27 DIAGNOSIS — Z87891 Personal history of nicotine dependence: Secondary | ICD-10-CM | POA: Diagnosis not present

## 2014-12-27 HISTORY — PX: ICD LEAD REMOVAL: SHX5855

## 2014-12-27 SURGERY — REMOVAL, ELECTRODE LEAD, ICD
Anesthesia: General | Site: Shoulder | Laterality: Left | Wound class: Clean

## 2014-12-27 MED ORDER — PROPOFOL INFUSION 10 MG/ML OPTIME
INTRAVENOUS | Status: DC | PRN
  Administered 2014-12-27: 75 ug/kg/min via INTRAVENOUS

## 2014-12-27 MED ORDER — ONDANSETRON HCL 4 MG/2ML IJ SOLN: 4.0000 mg | Freq: Four times a day (QID) | INTRAMUSCULAR | Status: DC | PRN

## 2014-12-27 MED ORDER — GENTAMICIN SULFATE 40 MG/ML IJ SOLN
INTRAMUSCULAR | Status: AC
Start: 2014-12-27 — End: 2014-12-27
  Filled 2014-12-27: qty 2

## 2014-12-27 MED ORDER — IPRATROPIUM-ALBUTEROL 0.5-2.5 (3) MG/3ML IN SOLN
3.0000 mL | Freq: Four times a day (QID) | RESPIRATORY_TRACT | Status: DC
  Administered 2014-12-27: 13:00:00 via RESPIRATORY_TRACT

## 2014-12-27 MED ORDER — CEFAZOLIN SODIUM 1-5 GM-% IV SOLN
INTRAVENOUS | Status: AC
  Administered 2014-12-27: 1 g via INTRAVENOUS
  Filled 2014-12-27: qty 50

## 2014-12-27 MED ORDER — FENTANYL CITRATE (PF) 100 MCG/2ML IJ SOLN
INTRAMUSCULAR | Status: DC | PRN
  Administered 2014-12-27: 25 ug via INTRAVENOUS

## 2014-12-27 MED ORDER — LIDOCAINE 1 % OPTIME INJ - NO CHARGE
INTRAMUSCULAR | Status: DC | PRN
  Administered 2014-12-27: 30 mL

## 2014-12-27 MED ORDER — GENTAMICIN SULFATE 40 MG/ML IJ SOLN: Freq: Once | INTRAMUSCULAR | Status: DC

## 2014-12-27 MED ORDER — CEFAZOLIN SODIUM 1-5 GM-% IV SOLN
1.0000 g | Freq: Once | INTRAVENOUS | Status: AC
  Administered 2014-12-27: 1 g via INTRAVENOUS

## 2014-12-27 MED ORDER — LIDOCAINE HCL (PF) 1 % IJ SOLN
INTRAMUSCULAR | Status: AC
  Filled 2014-12-27: qty 30

## 2014-12-27 MED ORDER — GENTAMICIN SULFATE 40 MG/ML IJ SOLN
INTRAMUSCULAR | Status: DC | PRN
  Administered 2014-12-27: 125 mL

## 2014-12-27 MED ORDER — CEFAZOLIN SODIUM 1-5 GM-% IV SOLN: 1.0000 g | Freq: Four times a day (QID) | INTRAVENOUS | Status: DC

## 2014-12-27 MED ORDER — ONDANSETRON HCL 4 MG/2ML IJ SOLN: 4.0000 mg | Freq: Once | INTRAMUSCULAR | Status: DC | PRN

## 2014-12-27 MED ORDER — PROPOFOL 10 MG/ML IV BOLUS
INTRAVENOUS | Status: DC | PRN
  Administered 2014-12-27 (×2): 20 mg via INTRAVENOUS

## 2014-12-27 MED ORDER — IPRATROPIUM-ALBUTEROL 0.5-2.5 (3) MG/3ML IN SOLN
RESPIRATORY_TRACT | Status: AC
  Filled 2014-12-27: qty 3

## 2014-12-27 MED ORDER — ACETAMINOPHEN 325 MG PO TABS: 325.0000 mg | ORAL_TABLET | ORAL | Status: DC | PRN

## 2014-12-27 MED ORDER — LACTATED RINGERS IV SOLN
INTRAVENOUS | Status: DC
  Administered 2014-12-27: 13:00:00 via INTRAVENOUS

## 2014-12-27 MED ORDER — FENTANYL CITRATE (PF) 100 MCG/2ML IJ SOLN: 25.0000 ug | INTRAMUSCULAR | Status: DC | PRN

## 2014-12-27 MED ORDER — MIDAZOLAM HCL 2 MG/2ML IJ SOLN
INTRAMUSCULAR | Status: DC | PRN
  Administered 2014-12-27: 1 mg via INTRAVENOUS

## 2014-12-27 SURGICAL SUPPLY — 47 items
BAG DECANTER STRL (MISCELLANEOUS) ×3 IMPLANT
BLADE SURG SZ10 CARB STEEL (BLADE) ×3 IMPLANT
CANISTER SUCT 1200ML W/VALVE (MISCELLANEOUS) ×3 IMPLANT
CHLORAPREP W/TINT 26ML (MISCELLANEOUS) ×3 IMPLANT
CLOSURE WOUND 1/2 X4 (GAUZE/BANDAGES/DRESSINGS) ×1
COVER LIGHT HANDLE STERIS (MISCELLANEOUS) ×4 IMPLANT
DEVICE DISSECT PLASMABLAD 3.0S (MISCELLANEOUS) ×1 IMPLANT
DRAPE C-ARM XRAY 36X54 (DRAPES) ×1 IMPLANT
DRAPE INCISE IOBAN 66X45 STRL (DRAPES) ×3 IMPLANT
DRAPE PED LAPAROTOMY (DRAPES) ×3 IMPLANT
DRESSING TELFA 4X3 1S ST N-ADH (GAUZE/BANDAGES/DRESSINGS) ×2 IMPLANT
DRSG TEGADERM 4X4.75 (GAUZE/BANDAGES/DRESSINGS) ×3 IMPLANT
DRSG TEGADERM 6X8 (GAUZE/BANDAGES/DRESSINGS) ×3 IMPLANT
GLOVE BIO SURGEON STRL SZ8 (GLOVE) ×3 IMPLANT
GOWN STRL REUS W/ TWL LRG LVL3 (GOWN DISPOSABLE) ×1 IMPLANT
GOWN STRL REUS W/ TWL XL LVL3 (GOWN DISPOSABLE) ×1 IMPLANT
GOWN STRL REUS W/TWL LRG LVL3 (GOWN DISPOSABLE) ×3
GOWN STRL REUS W/TWL XL LVL3 (GOWN DISPOSABLE) ×3
GRADUATE 1200CC STRL 31836 (MISCELLANEOUS) ×3 IMPLANT
ICD VIVA XT CRT-D DTBA1D1 (ICD Generator) ×2 IMPLANT
IV NS 1000ML (IV SOLUTION)
IV NS 1000ML BAXH (IV SOLUTION) ×1 IMPLANT
KIT RM TURNOVER STRD PROC AR (KITS) ×3 IMPLANT
NDL FILTER BLUNT 18X1 1/2 (NEEDLE) ×1 IMPLANT
NDL HYPO 25X1 1.5 SAFETY (NEEDLE) ×1 IMPLANT
NDL SPNL 22GX3.5 QUINCKE BK (NEEDLE) ×1 IMPLANT
NEEDLE FILTER BLUNT 18X 1/2SAF (NEEDLE) ×2
NEEDLE FILTER BLUNT 18X1 1/2 (NEEDLE) ×1 IMPLANT
NEEDLE HYPO 25X1 1.5 SAFETY (NEEDLE) ×3 IMPLANT
NEEDLE SPNL 22GX3.5 QUINCKE BK (NEEDLE) ×3 IMPLANT
NS IRRIG 500ML POUR BTL (IV SOLUTION) ×3 IMPLANT
PACK BASIN MINOR ARMC (MISCELLANEOUS) ×3 IMPLANT
PACK PACE INSERTION (MISCELLANEOUS) ×3 IMPLANT
PAD GROUND ADULT SPLIT (MISCELLANEOUS) ×3 IMPLANT
PAD STATPAD (MISCELLANEOUS) ×3 IMPLANT
PLASMABLADE 3.0S (MISCELLANEOUS) ×3
STRAP SAFETY BODY (MISCELLANEOUS) ×3 IMPLANT
STRIP CLOSURE SKIN 1/2X4 (GAUZE/BANDAGES/DRESSINGS) ×2 IMPLANT
SUT SILK 2 0 SH (SUTURE) ×3 IMPLANT
SUT VIC AB 2-0 CT1 27 (SUTURE)
SUT VIC AB 2-0 CT1 TAPERPNT 27 (SUTURE) ×1 IMPLANT
SUT VIC AB 2-0 CT2 27 (SUTURE) ×3 IMPLANT
SUT VIC AB 3-0 PS2 18 (SUTURE) ×1 IMPLANT
SUT VIC AB 4-0 PS2 18 (SUTURE) ×3 IMPLANT
SYR BULB IRRIG 60ML STRL (SYRINGE) ×3 IMPLANT
SYR CONTROL 10ML (SYRINGE) ×3 IMPLANT
SYRINGE 10CC LL (SYRINGE) ×3 IMPLANT

## 2014-12-27 NOTE — OR Nursing (Signed)
Pt. Extremely sob, states this is normal. Was wearing 2l oxygen on arrival. O2 sat 98% on ra. Breathing treatment given, resp rate improved,  o2 sat 100%. Dr Henrene Hawking aware.

## 2014-12-27 NOTE — Anesthesia Procedure Notes (Signed)
Date/Time: 12/27/2014 1:36 PM Performed by: Stormy Fabian Pre-anesthesia Checklist: Patient identified, Emergency Drugs available, Suction available and Patient being monitored Patient Re-evaluated:Patient Re-evaluated prior to inductionOxygen Delivery Method: Nasal cannula

## 2014-12-27 NOTE — Anesthesia Preprocedure Evaluation (Signed)
Anesthesia Evaluation  Patient identified by MRN, date of birth, ID band Patient awake    Reviewed: Allergy & Precautions, NPO status , Patient's Chart, lab work & pertinent test results  Airway Mallampati: II  TM Distance: >3 FB Neck ROM: Full    Dental  (+) Upper Dentures, Lower Dentures   Pulmonary COPD COPD inhaler, former smoker,          Cardiovascular hypertension, Pt. on medications + pacemaker     Neuro/Psych Anxiety Depression    GI/Hepatic GERD-  Medicated and Controlled,  Endo/Other    Renal/GU      Musculoskeletal   Abdominal   Peds  Hematology   Anesthesia Other Findings   Reproductive/Obstetrics                             Anesthesia Physical Anesthesia Plan  ASA: III  Anesthesia Plan: General   Post-op Pain Management:    Induction: Intravenous  Airway Management Planned: Nasal Cannula  Additional Equipment:   Intra-op Plan:   Post-operative Plan:   Informed Consent: I have reviewed the patients History and Physical, chart, labs and discussed the procedure including the risks, benefits and alternatives for the proposed anesthesia with the patient or authorized representative who has indicated his/her understanding and acceptance.     Plan Discussed with:   Anesthesia Plan Comments:         Anesthesia Quick Evaluation

## 2014-12-27 NOTE — Anesthesia Postprocedure Evaluation (Signed)
  Anesthesia Post-op Note  Patient: Robert Ball  Procedure(s) Performed: Procedure(s): ICD LEAD REMOVAL/ICD GENERATOR CHANGE OUT (Left)  Anesthesia type:General  Patient location: PACU  Post pain: Pain level controlled  Post assessment: Post-op Vital signs reviewed, Patient's Cardiovascular Status Stable, Respiratory Function Stable, Patent Airway and No signs of Nausea or vomiting  Post vital signs: Reviewed and stable  Last Vitals:  Filed Vitals:   12/27/14 1428  BP: 108/73  Pulse: 64  Temp: 36.3 C  Resp: 18    Level of consciousness: awake, alert  and patient cooperative  Complications: No apparent anesthesia complications

## 2014-12-27 NOTE — Transfer of Care (Signed)
Immediate Anesthesia Transfer of Care Note  Patient: Robert Ball  Procedure(s) Performed: Procedure(s): ICD LEAD REMOVAL/ICD GENERATOR CHANGE OUT (Left)  Patient Location: PACU  Anesthesia Type:General  Level of Consciousness: sedated  Airway & Oxygen Therapy: Patient Spontanous Breathing and Patient connected to face mask oxygen  Post-op Assessment: Report given to RN and Post -op Vital signs reviewed and stable  Post vital signs: Reviewed and stable  Last Vitals:  Filed Vitals:   12/27/14 1428  BP: 108/73  Pulse: 64  Temp: 36.3 C  Resp: 18    Complications: No apparent anesthesia complications

## 2014-12-27 NOTE — Op Note (Signed)
[facility]   12/27/2014                     3:01 PM  PATIENT:  Robert Ball    PRE-OPERATIVE DIAGNOSIS:  CHF  POST-OPERATIVE DIAGNOSIS:  Same  PROCEDURE:  ICD LEAD REMOVAL/ICD GENERATOR CHANGE OUT  SURGEON:  Mrytle Bento, MD    ANESTHESIA:     PREOPERATIVE INDICATIONS:  Robert Ball is a  69 y.o. male with a diagnosis of CHF who failed conservative measures and elected for surgical management.    The risks benefits and alternatives were discussed with the patient preoperatively including but not limited to the risks of infection, bleeding, cardiopulmonary complications, the need for revision surgery, among others, and the patient was willing to proceed.   OPERATIVE PROCEDURE: The patient was brought to the operating room in a fasting state.   A 6 centimeter incision was performed over the left pectoral region. The old ICD generator was retrieved by electrocautery and blunt dissection. The leads were disconnected, and connected to new BiV ICD generator ( Medtronic Viva XT CRT-D ), and positioned into the pocket. The pocket was closed with 2-0 and 4-0 Vicryl. Steri strips and a pressure dressing were applied.

## 2014-12-27 NOTE — Discharge Instructions (Signed)

## 2015-01-02 DIAGNOSIS — R079 Chest pain, unspecified: Secondary | ICD-10-CM | POA: Insufficient documentation

## 2015-01-02 DIAGNOSIS — I872 Venous insufficiency (chronic) (peripheral): Secondary | ICD-10-CM | POA: Insufficient documentation

## 2015-01-30 DIAGNOSIS — I429 Cardiomyopathy, unspecified: Secondary | ICD-10-CM | POA: Insufficient documentation

## 2015-01-31 DIAGNOSIS — F329 Major depressive disorder, single episode, unspecified: Secondary | ICD-10-CM | POA: Insufficient documentation

## 2015-01-31 DIAGNOSIS — F32A Depression, unspecified: Secondary | ICD-10-CM | POA: Insufficient documentation

## 2015-01-31 DIAGNOSIS — Z79899 Other long term (current) drug therapy: Secondary | ICD-10-CM | POA: Insufficient documentation

## 2015-04-08 ENCOUNTER — Ambulatory Visit (INDEPENDENT_AMBULATORY_CARE_PROVIDER_SITE_OTHER)
Admission: EM | Admit: 2015-04-08 | Discharge: 2015-04-08 | Disposition: A | Discharge status: Other Acute Inpatient Hospital | Payer: Medicare PPO | Source: Home / Self Care | Attending: Family Medicine | Admitting: Family Medicine

## 2015-04-08 ENCOUNTER — Emergency Department
Admission: EM | Admit: 2015-04-08 | Discharge: 2015-04-08 | Discharge status: Against Medical Advice | Payer: Medicare PPO | Attending: Emergency Medicine | Admitting: Emergency Medicine

## 2015-04-08 ENCOUNTER — Encounter: Payer: Self-pay | Admitting: Emergency Medicine

## 2015-04-08 ENCOUNTER — Emergency Department: Payer: Medicare PPO

## 2015-04-08 DIAGNOSIS — R109 Unspecified abdominal pain: Secondary | ICD-10-CM | POA: Insufficient documentation

## 2015-04-08 DIAGNOSIS — R0602 Shortness of breath: Secondary | ICD-10-CM | POA: Diagnosis not present

## 2015-04-08 DIAGNOSIS — R140 Abdominal distension (gaseous): Secondary | ICD-10-CM

## 2015-04-08 DIAGNOSIS — I10 Essential (primary) hypertension: Secondary | ICD-10-CM | POA: Insufficient documentation

## 2015-04-08 DIAGNOSIS — R100 Acute abdomen: Secondary | ICD-10-CM

## 2015-04-08 DIAGNOSIS — Z87891 Personal history of nicotine dependence: Secondary | ICD-10-CM | POA: Insufficient documentation

## 2015-04-08 DIAGNOSIS — M549 Dorsalgia, unspecified: Secondary | ICD-10-CM | POA: Diagnosis not present

## 2015-04-08 LAB — CBC
HEMATOCRIT: 45.5 % (ref 40.0–52.0)
HEMOGLOBIN: 15.2 g/dL (ref 13.0–18.0)
MCH: 30.9 pg (ref 26.0–34.0)
MCHC: 33.4 g/dL (ref 32.0–36.0)
MCV: 92.5 fL (ref 80.0–100.0)
Platelets: 178 10*3/uL (ref 150–440)
RBC: 4.92 MIL/uL (ref 4.40–5.90)
RDW: 14.1 % (ref 11.5–14.5)
WBC: 8.2 10*3/uL (ref 3.8–10.6)

## 2015-04-08 LAB — COMPREHENSIVE METABOLIC PANEL
ALBUMIN: 4.1 g/dL (ref 3.5–5.0)
ALK PHOS: 28 U/L — AB (ref 38–126)
ALT: 16 U/L — ABNORMAL LOW (ref 17–63)
AST: 24 U/L (ref 15–41)
Anion gap: 10 (ref 5–15)
BILIRUBIN TOTAL: 0.8 mg/dL (ref 0.3–1.2)
BUN: 11 mg/dL (ref 6–20)
CALCIUM: 9.4 mg/dL (ref 8.9–10.3)
CO2: 19 mmol/L — AB (ref 22–32)
Chloride: 102 mmol/L (ref 101–111)
Creatinine, Ser: 1.24 mg/dL (ref 0.61–1.24)
GFR calc Af Amer: >60 mL/min (ref >60)
GFR calc non Af Amer: 57 mL/min — ABNORMAL LOW (ref >60)
GLUCOSE: 96 mg/dL (ref 65–99)
Potassium: 3.8 mmol/L (ref 3.5–5.1)
SODIUM: 131 mmol/L — AB (ref 135–145)
TOTAL PROTEIN: 7.7 g/dL (ref 6.5–8.1)

## 2015-04-08 LAB — URINALYSIS COMPLETE WITH MICROSCOPIC (ARMC ONLY)
BACTERIA UA: NONE SEEN
BILIRUBIN URINE: NEGATIVE
GLUCOSE, UA: NEGATIVE mg/dL
HGB URINE DIPSTICK: NEGATIVE
Ketones, ur: NEGATIVE mg/dL
Leukocytes, UA: NEGATIVE
NITRITE: NEGATIVE
PROTEIN: NEGATIVE mg/dL
RBC / HPF: NONE SEEN RBC/hpf (ref 0–5)
Specific Gravity, Urine: 1.002 — ABNORMAL LOW (ref 1.005–1.030)
pH: 5 (ref 5.0–8.0)

## 2015-04-08 LAB — TROPONIN I

## 2015-04-08 LAB — LIPASE, BLOOD: Lipase: 43 U/L (ref 11–51)

## 2015-04-08 NOTE — ED Notes (Signed)
[facility] with c/o mid abd pain that radiates into his back for the past week.

## 2015-04-08 NOTE — ED Notes (Signed)
Pt reports severe abd pain that radiates around to back associated with worsened difficulty breathing. Pt on 2 liters O2 via [place] at baseline. Pt reports symptoms started about 1 week ago with dysuria

## 2015-04-08 NOTE — Discharge Instructions (Signed)
Abdominal Pain, Adult °Many things can cause abdominal pain. Usually, abdominal pain is not caused by a disease and will improve without treatment. It can often be observed and treated at home. Your health care provider will do a physical exam and possibly order blood tests and X-rays to help determine the seriousness of your pain. However, in many cases, more time must pass before a clear cause of the pain can be found. Before that point, your health care provider may not know if you need more testing or further treatment. °HOME CARE INSTRUCTIONS °Monitor your abdominal pain for any changes. The following actions may help to alleviate any discomfort you are experiencing: °· Only take over-the-counter or prescription medicines as directed by your health care provider. °· Do not take laxatives unless directed to do so by your health care provider. °· Try a clear liquid diet (broth, tea, or water) as directed by your health care provider. Slowly move to a bland diet as tolerated. °SEEK MEDICAL CARE IF: °· You have unexplained abdominal pain. °· You have abdominal pain associated with nausea or diarrhea. °· You have pain when you urinate or have a bowel movement. °· You experience abdominal pain that wakes you in the night. °· You have abdominal pain that is worsened or improved by eating food. °· You have abdominal pain that is worsened with eating fatty foods. °· You have a fever. °SEEK IMMEDIATE MEDICAL CARE IF: °· Your pain does not go away within 2 hours. °· You keep throwing up (vomiting). °· Your pain is felt only in portions of the abdomen, such as the right side or the left lower portion of the abdomen. °· You pass bloody or black tarry stools. °MAKE SURE YOU: °· Understand these instructions. °· Will watch your condition. °· Will get help right away if you are not doing well or get worse. °  °This information is not intended to replace advice given to you by your health care provider. Make sure you discuss  any questions you have with your health care provider. °  °Document Released: 01/28/2005 Document Revised: 01/09/2015 Document Reviewed: 12/28/2012 °Elsevier Interactive Patient Education ©2016 [employer] ° °Shortness of Breath °Shortness of breath means you have trouble breathing. Shortness of breath needs medical care right away. °HOME CARE  °· Do not smoke. °· Avoid being around chemicals or things (paint fumes, dust) that may bother your breathing. °· Rest as needed. Slowly begin your normal activities. °· Only take medicines as told by your doctor. °· Keep all doctor visits as told. °GET HELP RIGHT AWAY IF:  °· Your shortness of breath gets worse. °· You feel lightheaded, pass out (faint), or have a cough that is not helped by medicine. °· You cough up blood. °· You have pain with breathing. °· You have pain in your chest, arms, shoulders, or belly (abdomen). °· You have a fever. °· You cannot walk up stairs or exercise the way you normally do. °· You do not get better in the time expected. °· You have a hard time doing normal activities even with rest. °· You have problems with your medicines. °· You have any new symptoms. °MAKE SURE YOU: °· Understand these instructions. °· Will watch your condition. °· Will get help right away if you are not doing well or get worse. °  °This information is not intended to replace advice given to you by your health care provider. Make sure you discuss any questions you have with your health   care provider. °  °Document Released: 10/07/2007 Document Revised: 04/25/2013 Document Reviewed: 07/06/2011 °Elsevier Interactive Patient Education ©2016 [employer] ° °

## 2015-04-08 NOTE — ED Provider Notes (Signed)
CSN: [id number]     Arrival date & time 04/08/15  1413 History   First MD Initiated Contact with Patient 04/08/15 1423    Nurses notes were reviewed. Chief Complaint  Patient presents with  . Abdominal Pain   Patient's here because of abdominal pain. Pelvic since Friday he's had increasing progressive abdominal pain without Friday he was having passed a kidney stone is also not passing water Friday. He finally to passing or making water according to his wife. They saw the nephrologist today to seeing who of thought that he had a UTI changes anabiotic. He also has history of COPD and has electronic ventricular pacemaker. He also reports being short of breath as well as having abdominal pain and difficulty breathing. O2 sat going but he still short of breath   (Consider location/radiation/quality/duration/timing/severity/associated sxs/prior Treatment) Patient is a 69 y.o. male presenting with abdominal pain. No language interpreter was used.  Abdominal Pain Pain location:  L flank, epigastric and LLQ Pain quality: sharp and shooting   Pain radiates to:  Does not radiate Pain severity:  Moderate Onset quality:  Sudden Progression:  Worsening Chronicity:  New Context: awakening from sleep and recent illness   Context: not medication withdrawal and not previous surgeries   Relieved by:  Nothing Ineffective treatments:  None tried Associated symptoms: chest pain, nausea and shortness of breath   Associated symptoms: no fever     Past Medical History  Diagnosis Date  . PTSD (post-traumatic stress disorder)   . Hypertension   . Anxiety   . Depression   . Prostate enlargement   . GERD (gastroesophageal reflux disease)   . Headache   . Elevated lipids   . Shakes   . COPD (chronic obstructive pulmonary disease) (HCC)     On 2liter oxygen   Past Surgical History  Procedure Laterality Date  . Neck surgery    . Hernia repair    . Hemorrhoid surgery    . Cataract extraction w/  intraocular lens  implant, bilateral Bilateral   . Eye muscle surgery Bilateral   . Icd lead removal Left 12/27/2014    Procedure: ICD LEAD REMOVAL/ICD GENERATOR CHANGE OUT;  Surgeon: Marcina Millard, MD;  Location: [facility] ORS;  Service: Cardiovascular;  Laterality: Left;   History reviewed. No pertinent family history. Social History  Substance Use Topics  . Smoking status: Former Smoker    Quit date: 09/24/2007  . Smokeless tobacco: None  . Alcohol Use: Yes     Comment: occ    Review of Systems  Constitutional: Negative for fever.  Respiratory: Positive for shortness of breath.   Cardiovascular: Positive for chest pain.  Gastrointestinal: Positive for nausea and abdominal pain.    Allergies  Aspirin; Ciprofloxacin hcl; Ibuprofen; Metronidazole; Naproxen; and Tetracyclines & related  Home Medications   Prior to Admission medications   Medication Sig Start Date End Date Taking? Authorizing Provider  amoxicillin-clavulanate (AUGMENTIN) 500-125 MG tablet Take 1 tablet by mouth 3 (three) times daily.   Yes Historical Provider, MD  acetaminophen (TYLENOL) 325 MG tablet Take 650 mg by mouth every 6 (six) hours as needed for mild pain, moderate pain, fever or headache.    Historical Provider, MD  albuterol (PROVENTIL HFA;VENTOLIN HFA) 108 (90 BASE) MCG/ACT inhaler Inhale 2 puffs into the lungs every 6 (six) hours as needed for wheezing or shortness of breath.    Historical Provider, MD  albuterol (PROVENTIL) (2.5 MG/3ML) 0.083% nebulizer solution Take 2.5 mg by nebulization every 6 (six)  hours as needed for wheezing or shortness of breath.    Historical Provider, MD  amLODipine (NORVASC) 10 MG tablet Take 10 mg by mouth daily.    Historical Provider, MD  atorvastatin (LIPITOR) 40 MG tablet Take 40 mg by mouth daily.    Historical Provider, MD  budesonide-formoterol (SYMBICORT) 160-4.5 MCG/ACT inhaler Inhale 2 puffs into the lungs 2 (two) times daily.    Historical Provider, MD   carvedilol (COREG) 12.5 MG tablet Take 12.5 mg by mouth 2 (two) times daily with a meal.    Historical Provider, MD  Cholecalciferol (VITAMIN D3) 2000 UNITS capsule Take 2,000 Units by mouth daily.    Historical Provider, MD  dimenhyDRINATE (DRAMAMINE) 50 MG tablet Take 50 mg by mouth every 8 (eight) hours as needed for nausea.    Historical Provider, MD  fenofibrate 160 MG tablet Take 160 mg by mouth daily.    Historical Provider, MD  furosemide (LASIX) 40 MG tablet Take 40 mg by mouth daily.    Historical Provider, MD  loperamide (IMODIUM) 2 MG capsule Take 2-4 mg by mouth as needed for diarrhea or loose stools. Patient takes  every morning and  as needed during the day    Historical Provider, MD  omeprazole (PRILOSEC) 20 MG capsule Take 20 mg by mouth 2 (two) times daily before a meal.    Historical Provider, MD  OXYGEN Inhale 2 L into the lungs See admin instructions. Patient uses at bedtime and as needed during the daytime    Historical Provider, MD  potassium chloride SA (K-DUR,KLOR-CON) 20 MEQ tablet Take 20 mEq by mouth daily.    Historical Provider, MD  predniSONE (DELTASONE) 10 MG tablet Take 10 mg by mouth daily with breakfast.    Historical Provider, MD  prochlorperazine (COMPAZINE) 5 MG tablet Take 5 mg by mouth every 6 (six) hours as needed for nausea or vomiting.    Historical Provider, MD  roflumilast (DALIRESP) 500 MCG TABS tablet Take 500 mcg by mouth daily.    Historical Provider, MD  sertraline (ZOLOFT) 100 MG tablet Take 100 mg by mouth 2 (two) times daily.    Historical Provider, MD  tamsulosin (FLOMAX) 0.4 MG CAPS capsule Take 0.4 mg by mouth daily.    Historical Provider, MD  tiotropium (SPIRIVA) 18 MCG inhalation capsule Place 18 mcg into inhaler and inhale daily.    Historical Provider, MD  traMADol (ULTRAM) 50 MG tablet Take by mouth 2 (two) times daily as needed for moderate pain or severe pain.    Historical Provider, MD  ziprasidone (GEODON) 60 MG capsule Take 60  mg by mouth at bedtime.    Historical Provider, MD   Meds Ordered and Administered this Visit  Medications - No data to display  BP 138/77 mmHg  Pulse 65  Temp(Src) 97.4 F (36.3 C) (Tympanic)  Resp 22  Ht  (1.651 m)  Wt 176 lb (79.833 kg)  BMI 29.29 kg/m2  SpO2 98% No data found.   Physical Exam  Constitutional: He is oriented to person, place, and time. He appears distressed. He is not intubated.  HENT:  Head: Normocephalic and atraumatic.  Neck: Neck supple.  Cardiovascular: Normal rate and regular rhythm.  Exam reveals distant heart sounds.   Pulmonary/Chest: He is not intubated. No respiratory distress. He has decreased breath sounds. He exhibits no tenderness.  Abdominal: He exhibits distension. Bowel sounds are decreased. There is tenderness in the left lower quadrant. There is CVA tenderness.  Musculoskeletal:  Right shoulder: He exhibits decreased range of motion. He exhibits no tenderness and no deformity.  Neurological: He is alert and oriented to person, place, and time. No cranial nerve deficit.  Skin: Skin is warm. He is diaphoretic.  Psychiatric: His behavior is normal. Thought content normal. His mood appears anxious.  Vitals reviewed.   ED Course  Procedures (including critical care time)  Labs Review Labs Reviewed - No data to display  Imaging Review No results found.   Visual Acuity Review  Right Eye Distance:   Left Eye Distance:   Bilateral Distance:    Right Eye Near:   Left Eye Near:    Bilateral Near:     ED ECG REPORT   Date: 04/08/2015  EKG Time: 2:57 PM  Rate: 64  Rhythm: Electronic pacemaker  Axis: 60  Intervals:none  ST&T Change: none  Narrative Interpretation: Electronic ventricular pacemaker            MDM   1. Abdominal pain, acute   2. Abdominal distension   3. Shortness of breath     [facility] because of his multiple complaints and morbidity at this time patient's wife in agreement.  [facility] and she is expecting his arrival by EMS.    Hassan RowanEugene Sherard Sutch, MD 04/08/15 1500

## 2015-04-09 ENCOUNTER — Emergency Department: Payer: Medicare PPO

## 2015-04-09 ENCOUNTER — Telehealth: Payer: Self-pay | Admitting: Emergency Medicine

## 2015-04-09 ENCOUNTER — Observation Stay
Admission: EM | Admit: 2015-04-09 | Discharge: 2015-04-11 | Disposition: A | Discharge status: Home / Self Care | Payer: Medicare PPO | Source: Home / Self Care | Attending: Emergency Medicine | Admitting: Emergency Medicine

## 2015-04-09 ENCOUNTER — Encounter: Payer: Self-pay | Admitting: Medical Oncology

## 2015-04-09 DIAGNOSIS — J449 Chronic obstructive pulmonary disease, unspecified: Secondary | ICD-10-CM

## 2015-04-09 DIAGNOSIS — I472 Ventricular tachycardia, unspecified: Secondary | ICD-10-CM

## 2015-04-09 DIAGNOSIS — M545 Low back pain, unspecified: Secondary | ICD-10-CM

## 2015-04-09 DIAGNOSIS — R1011 Right upper quadrant pain: Secondary | ICD-10-CM

## 2015-04-09 DIAGNOSIS — R109 Unspecified abdominal pain: Secondary | ICD-10-CM | POA: Diagnosis present

## 2015-04-09 LAB — COMPREHENSIVE METABOLIC PANEL
ALBUMIN: 3.7 g/dL (ref 3.5–5.0)
ALT: 14 U/L — ABNORMAL LOW (ref 17–63)
AST: 21 U/L (ref 15–41)
Alkaline Phosphatase: 24 U/L — ABNORMAL LOW (ref 38–126)
Anion gap: 6 (ref 5–15)
BILIRUBIN TOTAL: 0.8 mg/dL (ref 0.3–1.2)
BUN: 13 mg/dL (ref 6–20)
CO2: 24 mmol/L (ref 22–32)
Calcium: 8.8 mg/dL — ABNORMAL LOW (ref 8.9–10.3)
Chloride: 106 mmol/L (ref 101–111)
Creatinine, Ser: 1.31 mg/dL — ABNORMAL HIGH (ref 0.61–1.24)
GFR calc Af Amer: >60 mL/min (ref >60)
GFR calc non Af Amer: 53 mL/min — ABNORMAL LOW (ref >60)
GLUCOSE: 117 mg/dL — AB (ref 65–99)
POTASSIUM: 3.7 mmol/L (ref 3.5–5.1)
Sodium: 136 mmol/L (ref 135–145)
TOTAL PROTEIN: 6.7 g/dL (ref 6.5–8.1)

## 2015-04-09 LAB — CBC WITH DIFFERENTIAL/PLATELET
BASOS ABS: 0 10*3/uL (ref 0–0.1)
BASOS PCT: 1 %
EOS ABS: 0 10*3/uL (ref 0–0.7)
EOS PCT: 1 %
HEMATOCRIT: 41.1 % (ref 40.0–52.0)
HEMOGLOBIN: 13.8 g/dL (ref 13.0–18.0)
LYMPHS ABS: 0.6 10*3/uL — AB (ref 1.0–3.6)
LYMPHS PCT: 11 %
MCH: 31.3 pg (ref 26.0–34.0)
MCHC: 33.6 g/dL (ref 32.0–36.0)
MCV: 93 fL (ref 80.0–100.0)
MONOS PCT: 6 %
Monocytes Absolute: 0.3 10*3/uL (ref 0.2–1.0)
Neutro Abs: 4.3 10*3/uL (ref 1.4–6.5)
Neutrophils Relative %: 81 %
Platelets: 153 10*3/uL (ref 150–440)
RBC: 4.42 MIL/uL (ref 4.40–5.90)
RDW: 14 % (ref 11.5–14.5)
WBC: 5.3 10*3/uL (ref 3.8–10.6)

## 2015-04-09 LAB — TROPONIN I: Troponin I: <0.03 ng/mL (ref <0.031)

## 2015-04-09 MED ORDER — BUDESONIDE-FORMOTEROL FUMARATE 160-4.5 MCG/ACT IN AERO
2.0000 | INHALATION_SPRAY | Freq: Two times a day (BID) | RESPIRATORY_TRACT | Status: DC
  Administered 2015-04-10 – 2015-04-11 (×4): 2 via RESPIRATORY_TRACT
  Filled 2015-04-09: qty 6

## 2015-04-09 MED ORDER — AMLODIPINE BESYLATE 10 MG PO TABS
10.0000 mg | ORAL_TABLET | Freq: Every day | ORAL | Status: DC
  Administered 2015-04-10 – 2015-04-11 (×2): 10 mg via ORAL
  Filled 2015-04-09 (×2): qty 1

## 2015-04-09 MED ORDER — PANTOPRAZOLE SODIUM 40 MG PO TBEC
40.0000 mg | DELAYED_RELEASE_TABLET | Freq: Every day | ORAL | Status: DC
  Administered 2015-04-10 – 2015-04-11 (×2): 40 mg via ORAL
  Filled 2015-04-09 (×2): qty 1

## 2015-04-09 MED ORDER — TIOTROPIUM BROMIDE MONOHYDRATE 18 MCG IN CAPS
18.0000 ug | ORAL_CAPSULE | Freq: Every day | RESPIRATORY_TRACT | Status: DC
  Administered 2015-04-10 – 2015-04-11 (×2): 18 ug via RESPIRATORY_TRACT
  Filled 2015-04-09: qty 5

## 2015-04-09 MED ORDER — HYDROMORPHONE HCL 1 MG/ML IJ SOLN
INTRAMUSCULAR | Status: AC
  Filled 2015-04-09: qty 1

## 2015-04-09 MED ORDER — ALBUTEROL SULFATE (2.5 MG/3ML) 0.083% IN NEBU: 2.5000 mg | INHALATION_SOLUTION | Freq: Four times a day (QID) | RESPIRATORY_TRACT | Status: DC | PRN

## 2015-04-09 MED ORDER — CYCLOBENZAPRINE HCL 10 MG PO TABS: 5.0000 mg | ORAL_TABLET | Freq: Three times a day (TID) | ORAL | Status: DC | PRN

## 2015-04-09 MED ORDER — IOHEXOL 300 MG/ML  SOLN
100.0000 mL | Freq: Once | INTRAMUSCULAR | Status: AC | PRN
  Administered 2015-04-09: 100 mL via INTRAVENOUS

## 2015-04-09 MED ORDER — DIAZEPAM 5 MG PO TABS
5.0000 mg | ORAL_TABLET | Freq: Three times a day (TID) | ORAL | Status: DC | PRN
  Administered 2015-04-10: 5 mg via ORAL
  Filled 2015-04-09: qty 1

## 2015-04-09 MED ORDER — ONDANSETRON HCL 4 MG/2ML IJ SOLN
4.0000 mg | Freq: Once | INTRAMUSCULAR | Status: AC
  Administered 2015-04-09: 4 mg via INTRAVENOUS
  Filled 2015-04-09: qty 2

## 2015-04-09 MED ORDER — HYDROMORPHONE HCL 1 MG/ML IJ SOLN
0.5000 mg | INTRAMUSCULAR | Status: DC | PRN
  Administered 2015-04-09: 0.5 mg via INTRAVENOUS
  Filled 2015-04-09 (×4): qty 1

## 2015-04-09 MED ORDER — VITAMIN D 1000 UNITS PO TABS
5000.0000 [IU] | ORAL_TABLET | ORAL | Status: DC
  Administered 2015-04-10: 5000 [IU] via ORAL
  Filled 2015-04-09: qty 5

## 2015-04-09 MED ORDER — ZIPRASIDONE HCL 20 MG PO CAPS
60.0000 mg | ORAL_CAPSULE | Freq: Every day | ORAL | Status: DC
  Administered 2015-04-10 (×2): 60 mg via ORAL
  Filled 2015-04-09 (×3): qty 1

## 2015-04-09 MED ORDER — ACETAMINOPHEN 650 MG RE SUPP: 650.0000 mg | Freq: Four times a day (QID) | RECTAL | Status: DC | PRN

## 2015-04-09 MED ORDER — LIDOCAINE 5 % EX PTCH
1.0000 | MEDICATED_PATCH | CUTANEOUS | Status: DC
  Administered 2015-04-09 – 2015-04-10 (×2): 1 via TRANSDERMAL
  Filled 2015-04-09 (×3): qty 1

## 2015-04-09 MED ORDER — SERTRALINE HCL 100 MG PO TABS
100.0000 mg | ORAL_TABLET | Freq: Two times a day (BID) | ORAL | Status: DC
  Administered 2015-04-09 – 2015-04-11 (×4): 100 mg via ORAL
  Filled 2015-04-09 (×4): qty 1

## 2015-04-09 MED ORDER — HYDROMORPHONE HCL 1 MG/ML IJ SOLN
1.0000 mg | Freq: Once | INTRAMUSCULAR | Status: AC
  Administered 2015-04-09: 1 mg via INTRAVENOUS

## 2015-04-09 MED ORDER — IPRATROPIUM-ALBUTEROL 0.5-2.5 (3) MG/3ML IN SOLN
3.0000 mL | Freq: Once | RESPIRATORY_TRACT | Status: AC
  Administered 2015-04-09: 3 mL via RESPIRATORY_TRACT

## 2015-04-09 MED ORDER — TRAMADOL HCL 50 MG PO TABS
50.0000 mg | ORAL_TABLET | Freq: Two times a day (BID) | ORAL | Status: DC | PRN
  Administered 2015-04-10 – 2015-04-11 (×2): 50 mg via ORAL
  Filled 2015-04-09 (×2): qty 1

## 2015-04-09 MED ORDER — TAMSULOSIN HCL 0.4 MG PO CAPS
0.4000 mg | ORAL_CAPSULE | Freq: Every day | ORAL | Status: DC
  Administered 2015-04-10 – 2015-04-11 (×2): 0.4 mg via ORAL
  Filled 2015-04-09 (×2): qty 1

## 2015-04-09 MED ORDER — FENOFIBRATE 160 MG PO TABS
160.0000 mg | ORAL_TABLET | Freq: Every day | ORAL | Status: DC
  Administered 2015-04-10 – 2015-04-11 (×2): 160 mg via ORAL
  Filled 2015-04-09 (×2): qty 1

## 2015-04-09 MED ORDER — AMOXICILLIN-POT CLAVULANATE 500-125 MG PO TABS
1.0000 | ORAL_TABLET | Freq: Two times a day (BID) | ORAL | Status: DC
  Administered 2015-04-10 – 2015-04-11 (×4): 500 mg via ORAL
  Filled 2015-04-09 (×5): qty 1

## 2015-04-09 MED ORDER — METHYLPREDNISOLONE SODIUM SUCC 40 MG IJ SOLR
INTRAMUSCULAR | Status: AC
  Filled 2015-04-09: qty 2

## 2015-04-09 MED ORDER — ONDANSETRON HCL 4 MG/2ML IJ SOLN: 4.0000 mg | Freq: Four times a day (QID) | INTRAMUSCULAR | Status: DC | PRN

## 2015-04-09 MED ORDER — ACETAMINOPHEN 325 MG PO TABS
650.0000 mg | ORAL_TABLET | Freq: Four times a day (QID) | ORAL | Status: DC | PRN
  Administered 2015-04-10: 650 mg via ORAL
  Filled 2015-04-09: qty 2

## 2015-04-09 MED ORDER — HYDROMORPHONE HCL 1 MG/ML IJ SOLN
1.0000 mg | INTRAMUSCULAR | Status: DC | PRN
  Administered 2015-04-09 – 2015-04-11 (×7): 1 mg via INTRAVENOUS
  Filled 2015-04-09 (×6): qty 1

## 2015-04-09 MED ORDER — POTASSIUM CHLORIDE CRYS ER 20 MEQ PO TBCR
20.0000 meq | EXTENDED_RELEASE_TABLET | Freq: Every day | ORAL | Status: DC
  Administered 2015-04-10 – 2015-04-11 (×2): 20 meq via ORAL
  Filled 2015-04-09 (×2): qty 1

## 2015-04-09 MED ORDER — CARVEDILOL 12.5 MG PO TABS
12.5000 mg | ORAL_TABLET | Freq: Two times a day (BID) | ORAL | Status: DC
  Administered 2015-04-10 – 2015-04-11 (×3): 12.5 mg via ORAL
  Filled 2015-04-09 (×3): qty 1

## 2015-04-09 MED ORDER — PROCHLORPERAZINE MALEATE 5 MG PO TABS
5.0000 mg | ORAL_TABLET | Freq: Three times a day (TID) | ORAL | Status: DC | PRN
  Filled 2015-04-09: qty 1

## 2015-04-09 MED ORDER — IPRATROPIUM-ALBUTEROL 0.5-2.5 (3) MG/3ML IN SOLN
3.0000 mL | RESPIRATORY_TRACT | Status: DC | PRN
  Administered 2015-04-10 (×2): 3 mL via RESPIRATORY_TRACT
  Filled 2015-04-09 (×2): qty 3

## 2015-04-09 MED ORDER — IPRATROPIUM-ALBUTEROL 0.5-2.5 (3) MG/3ML IN SOLN
RESPIRATORY_TRACT | Status: AC
Start: 2015-04-09 — End: 2015-04-09
  Filled 2015-04-09: qty 3

## 2015-04-09 MED ORDER — ENOXAPARIN SODIUM 40 MG/0.4ML ~~LOC~~ SOLN
40.0000 mg | Freq: Every day | SUBCUTANEOUS | Status: DC
  Administered 2015-04-09 – 2015-04-10 (×2): 40 mg via SUBCUTANEOUS
  Filled 2015-04-09 (×3): qty 0.4

## 2015-04-09 MED ORDER — ATORVASTATIN CALCIUM 20 MG PO TABS
40.0000 mg | ORAL_TABLET | Freq: Every day | ORAL | Status: DC
  Administered 2015-04-09 – 2015-04-10 (×2): 40 mg via ORAL
  Filled 2015-04-09 (×2): qty 2

## 2015-04-09 MED ORDER — SODIUM CHLORIDE 0.9 % IV BOLUS (SEPSIS)
500.0000 mL | Freq: Once | INTRAVENOUS | Status: AC
  Administered 2015-04-09: 500 mL via INTRAVENOUS

## 2015-04-09 MED ORDER — ROFLUMILAST 500 MCG PO TABS
500.0000 ug | ORAL_TABLET | Freq: Every day | ORAL | Status: DC
  Administered 2015-04-10 – 2015-04-11 (×2): 500 ug via ORAL
  Filled 2015-04-09 (×2): qty 1

## 2015-04-09 MED ORDER — ONDANSETRON HCL 4 MG PO TABS: 4.0000 mg | ORAL_TABLET | Freq: Four times a day (QID) | ORAL | Status: DC | PRN

## 2015-04-09 MED ORDER — IOHEXOL 240 MG/ML SOLN: 25.0000 mL | Freq: Once | INTRAMUSCULAR | Status: DC | PRN

## 2015-04-09 MED ORDER — ALBUTEROL SULFATE (2.5 MG/3ML) 0.083% IN NEBU: 3.0000 mL | INHALATION_SOLUTION | Freq: Four times a day (QID) | RESPIRATORY_TRACT | Status: DC | PRN

## 2015-04-09 MED ORDER — IPRATROPIUM-ALBUTEROL 0.5-2.5 (3) MG/3ML IN SOLN
3.0000 mL | Freq: Once | RESPIRATORY_TRACT | Status: AC
  Administered 2015-04-09: 3 mL via RESPIRATORY_TRACT
  Filled 2015-04-09: qty 3

## 2015-04-09 MED ORDER — FUROSEMIDE 40 MG PO TABS
40.0000 mg | ORAL_TABLET | Freq: Every day | ORAL | Status: DC
  Administered 2015-04-11: 40 mg via ORAL
  Filled 2015-04-09 (×2): qty 1

## 2015-04-09 MED ORDER — METHYLPREDNISOLONE SODIUM SUCC 125 MG IJ SOLR
80.0000 mg | Freq: Once | INTRAMUSCULAR | Status: AC
  Administered 2015-04-09: 80 mg via INTRAVENOUS

## 2015-04-09 MED ORDER — METHYLPREDNISOLONE SODIUM SUCC 40 MG IJ SOLR
40.0000 mg | Freq: Two times a day (BID) | INTRAMUSCULAR | Status: DC
  Administered 2015-04-10 – 2015-04-11 (×3): 40 mg via INTRAVENOUS
  Filled 2015-04-09 (×3): qty 1

## 2015-04-09 MED ORDER — ACETAMINOPHEN 500 MG PO TABS: 500.0000 mg | ORAL_TABLET | Freq: Four times a day (QID) | ORAL | Status: DC | PRN

## 2015-04-09 NOTE — ED Notes (Signed)
Called patient due to lwot to inquire about condition and follow up plans. Patient says he is actually worse now with his sob.  i  Asked him what his plans were, and told him that he can return here. He says he will decide whether to return soon.

## 2015-04-09 NOTE — H&P (Signed)
[facility] [place] [facility]   PATIENT NAME: Robert Ball    MR#:  [id number]  DATE OF BIRTH:  February 01, 1946  DATE OF ADMISSION:  04/09/2015  PRIMARY CARE PHYSICIAN: Mickey Farber, MD   REQUESTING/REFERRING PHYSICIAN: Dr. Janalyn Harder  CHIEF COMPLAINT:   Chief Complaint  Patient presents with  . Flank Pain  . Abdominal Pain  . Shortness of Breath    HISTORY OF PRESENT ILLNESS:  Robert Ball  is a 69 y.o. male with a known history of end-stage COPD, hypertension, anxiety, posttraumatic stress disorder, GERD, hypertension, hyperlipidemia, who presents to the hospital complaining of abdominal pain, shortness of breath. Patient says that he's been having abdominal pain now for the past 2 weeks but has gotten progressively worse. He describes his abdominal pain sharp in nature located in the right side of his abdomen radiating sometimes to his back. He denies any nausea, vomiting. He denies any diarrhea, melena, hematochezia associated with the abdominal pain. He denies any fevers or chills. He also has been complaining of worsening shortness of breath associated with this abdominal pain. He presented to the emergency room and underwent a CT scan of his abdomen pelvis which was essentially benign. He was treated for his abdominal pain with IV Dilaudid multiple times but his pain still persists. Hospitalist services were contacted further treatment and evaluation.  PAST MEDICAL HISTORY:   Past Medical History  Diagnosis Date  . PTSD (post-traumatic stress disorder)   . Hypertension   . Anxiety   . Depression   . Prostate enlargement   . GERD (gastroesophageal reflux disease)   . Headache   . Elevated lipids   . Shakes   . COPD (chronic obstructive pulmonary disease) (HCC)     On 2liter oxygen    PAST SURGICAL HISTORY:   Past Surgical History  Procedure Laterality Date  . Neck surgery    . Hernia repair    . Hemorrhoid surgery    . Cataract  extraction w/ intraocular lens  implant, bilateral Bilateral   . Eye muscle surgery Bilateral   . Icd lead removal Left 12/27/2014    Procedure: ICD LEAD REMOVAL/ICD GENERATOR CHANGE OUT;  Surgeon: Marcina Millard, MD;  Location: [facility] ORS;  Service: Cardiovascular;  Laterality: Left;    SOCIAL HISTORY:   Social History  Substance Use Topics  . Smoking status: Former Smoker -- 5.00 packs/day for 40 years    Types: Cigarettes    Quit date: 09/24/2007  . Smokeless tobacco: Not on file  . Alcohol Use: No     Comment: occ    FAMILY HISTORY:   Family History  Problem Relation Age of Onset  . Heart disease Father   . Heart disease Mother   . Lymphoma Father     DRUG ALLERGIES:   Allergies  Allergen Reactions  . Aspirin Other (See Comments)    Pt is unable to take this medication.    . Ciprofloxacin Hcl Other (See Comments)    Reaction:  Unknown   . Ibuprofen Other (See Comments)    Pt is unable to take this medication.    . Metronidazole Other (See Comments)    Reaction:  Unknown   . Naproxen Other (See Comments)    Pt is unable to take this medication.    . Tetracyclines & Related Other (See Comments)    Reaction:  Unknown     REVIEW OF SYSTEMS:   Review of Systems  Constitutional: Negative for fever  and weight loss.  HENT: Negative for congestion, nosebleeds and tinnitus.   Eyes: Negative for blurred vision, double vision and redness.  Respiratory: Positive for shortness of breath. Negative for cough and hemoptysis.   Cardiovascular: Negative for chest pain, orthopnea, leg swelling and PND.  Gastrointestinal: Positive for abdominal pain. Negative for nausea, vomiting, diarrhea and melena.  Genitourinary: Negative for dysuria, urgency and hematuria.  Musculoskeletal: Negative for joint pain and falls.  Neurological: Negative for dizziness, tingling, sensory change, focal weakness, seizures, weakness and headaches.  Endo/Heme/Allergies: Negative for polydipsia.  Does not bruise/bleed easily.  Psychiatric/Behavioral: Negative for depression and memory loss. The patient is not nervous/anxious.     MEDICATIONS AT HOME:   Prior to Admission medications   Medication Sig Start Date End Date Taking? Authorizing Provider  acetaminophen (TYLENOL) 500 MG tablet Take 500 mg by mouth every 6 (six) hours as needed for mild pain or headache.   Yes Historical Provider, MD  albuterol (PROVENTIL HFA;VENTOLIN HFA) 108 (90 BASE) MCG/ACT inhaler Inhale 2 puffs into the lungs every 6 (six) hours as needed for wheezing or shortness of breath.   Yes Historical Provider, MD  albuterol (PROVENTIL) (2.5 MG/3ML) 0.083% nebulizer solution Take 2.5 mg by nebulization every 6 (six) hours as needed for wheezing or shortness of breath.   Yes Historical Provider, MD  amLODipine (NORVASC) 10 MG tablet Take 10 mg by mouth daily.   Yes Historical Provider, MD  atorvastatin (LIPITOR) 40 MG tablet Take 40 mg by mouth daily.   Yes Historical Provider, MD  budesonide-formoterol (SYMBICORT) 160-4.5 MCG/ACT inhaler Inhale 2 puffs into the lungs 2 (two) times daily.   Yes Historical Provider, MD  carvedilol (COREG) 12.5 MG tablet Take 12.5 mg by mouth 2 (two) times daily.    Yes Historical Provider, MD  Cholecalciferol (VITAMIN D3) 5000 UNITS CAPS Take 5,000 Units by mouth every other day.   Yes Historical Provider, MD  dimenhyDRINATE (DRAMAMINE) 50 MG tablet Take 50 mg by mouth every 8 (eight) hours as needed for nausea.   Yes Historical Provider, MD  fenofibrate 160 MG tablet Take 160 mg by mouth daily.   Yes Historical Provider, MD  furosemide (LASIX) 40 MG tablet Take 40 mg by mouth daily.   Yes Historical Provider, MD  loperamide (IMODIUM) 2 MG capsule Take 4 mg by mouth daily.    Yes Historical Provider, MD  omeprazole (PRILOSEC) 20 MG capsule Take 20 mg by mouth 2 (two) times daily.    Yes Historical Provider, MD  potassium chloride SA (K-DUR,KLOR-CON) 20 MEQ tablet Take 20 mEq by mouth  daily.   Yes Historical Provider, MD  predniSONE (DELTASONE) 10 MG tablet Take 10 mg by mouth daily.    Yes Historical Provider, MD  prochlorperazine (COMPAZINE) 5 MG tablet Take 5 mg by mouth 3 (three) times daily as needed for nausea or vomiting.    Yes Historical Provider, MD  roflumilast (DALIRESP) 500 MCG TABS tablet Take 500 mcg by mouth daily.   Yes Historical Provider, MD  sertraline (ZOLOFT) 100 MG tablet Take 100 mg by mouth 2 (two) times daily.   Yes Historical Provider, MD  tamsulosin (FLOMAX) 0.4 MG CAPS capsule Take 0.4 mg by mouth daily.   Yes Historical Provider, MD  tiotropium (SPIRIVA) 18 MCG inhalation capsule Place 18 mcg into inhaler and inhale daily.   Yes Historical Provider, MD  traMADol (ULTRAM) 50 MG tablet Take 50 mg by mouth 2 (two) times daily as needed for moderate pain.  Yes Historical Provider, MD  ziprasidone (GEODON) 60 MG capsule Take 60 mg by mouth at bedtime.   Yes Historical Provider, MD      VITAL SIGNS:  Blood pressure 156/87, pulse 62, temperature 97.7 F (36.5 C), temperature source Oral, resp. rate 21, height  (1.651 m), weight 79.833 kg (176 lb), SpO2 98 %.  PHYSICAL EXAMINATION:  Physical Exam  GENERAL:  69 y.o.-year-old patient lying in the bed in pain and in mild resp. Distress.  EYES: Pupils equal, round, reactive to light and accommodation. No scleral icterus. Extraocular muscles intact.  HEENT: Head atraumatic, normocephalic. Oropharynx and nasopharynx clear. No oropharyngeal erythema, moist oral mucosa  NECK:  Supple, no jugular venous distention. No thyroid enlargement, no tenderness.  LUNGS: Prolonged inspiratory and expiratory phase, and expiratory wheezing. Positive use of accessory muscles. CARDIOVASCULAR: S1, S2 RRR. No murmurs, rubs, gallops, clicks.  ABDOMEN: Soft, nontender, nondistended. Bowel sounds present. No organomegaly or mass.  EXTREMITIES: No pedal edema, cyanosis, or clubbing. + 2 pedal & radial pulses b/l.    NEUROLOGIC: Cranial nerves II through XII are intact. No focal Motor or sensory deficits appreciated b/l PSYCHIATRIC: The patient is alert and oriented x 3. Anxious SKIN: No obvious rash, lesion, or ulcer.   LABORATORY PANEL:   CBC  Recent Labs Lab 04/09/15 1715  WBC 5.3  HGB 13.8  HCT 41.1  PLT 153   ------------------------------------------------------------------------------------------------------------------  Chemistries   Recent Labs Lab 04/09/15 1715  NA 136  K 3.7  CL 106  CO2 24  GLUCOSE 117*  BUN 13  CREATININE 1.31*  CALCIUM 8.8*  AST 21  ALT 14*  ALKPHOS 24*  BILITOT 0.8   ------------------------------------------------------------------------------------------------------------------  Cardiac Enzymes  Recent Labs Lab 04/09/15 1715  TROPONINI <0.03   ------------------------------------------------------------------------------------------------------------------  RADIOLOGY:  Dg Chest 1 View  04/09/2015  CLINICAL DATA:  Shortness breath and right-sided flank pain since last evening. EXAM: CHEST  1 VIEW COMPARISON:  Two-view chest x-ray 04/08/2015. FINDINGS: Low lung volumes exaggerate the heart size. Pacing and AICD wires are stable. Atherosclerotic calcifications are present at the aortic arch. Mild bibasilar airspace disease likely reflects atelectasis. No airspace consolidation is present otherwise. The visualized soft tissues and bony thorax are unremarkable. IMPRESSION: 1. Low lung volumes with mild bibasilar airspace disease likely reflects atelectasis. 2. Atherosclerosis. Electronically Signed   By: Marin Roberts M.D.   On: 04/09/2015 15:15   Dg Chest 2 View  04/08/2015  CLINICAL DATA:  Shortness of breath and cough for 1 week EXAM: CHEST - 2 VIEW COMPARISON:  12/25/2014 FINDINGS: Defibrillator is again seen and stable. Cardiac shadow is within normal limits. Aortic calcifications are noted. The lungs are well aerated bilaterally  without focal infiltrate or sizable effusion. No acute bony abnormality is noted. IMPRESSION: No active disease. Electronically Signed   By: Alcide Clever M.D.   On: 04/08/2015 16:53   Ct Abdomen Pelvis W Contrast  04/09/2015  CLINICAL DATA:  Acute right flank and upper abdominal pain for 2 weeks. EXAM: CT ABDOMEN AND PELVIS WITH CONTRAST TECHNIQUE: Multidetector CT imaging of the abdomen and pelvis was performed using the standard protocol following bolus administration of intravenous contrast. CONTRAST:  OMNIPAQUE IOHEXOL 300 MG/ML  SOLN COMPARISON:  04/06/2012 FINDINGS: Lower chest: Minor bibasilar atelectasis versus scarring. No lower lobe pneumonia. Pacer wires in the right side of the heart. Normal heart size. No pericardial or pleural effusion. Lower thoracic atherosclerosis noted. Degenerative osteophytes of the lower thoracic spine on the right. Hepatobiliary:  Diffuse hypoattenuation of the liver parenchyma compatible with hepatic steatosis. Liver measures 17.2 cm in craniocaudal dimension. No focal hepatic abnormality or biliary dilatation. Patent hepatic and portal veins. Collapsed gallbladder and biliary system unremarkable. No biliary dilatation. Pancreas: Calcifications of the pancreatic head from prior inflammation. Otherwise normal appearing pancreas for an 8. No ductal dilatation or acute inflammatory process. Spleen: No focal abnormality. Spleen is enlarged with a length of 16.9 cm. Splenic vein is patent. Adrenals/Urinary Tract: Normal adrenal glands for age. Kidneys demonstrate no acute obstruction, hydronephrosis, perinephric inflammatory process. Ureters are symmetric and decompressed. No obstructing ureteral calculus on either side. Renal atherosclerosis noted. Stomach/Bowel: Negative for obstruction, dilatation, or ileus. Normal appendix. No free air evident. Vascular/Lymphatic: Diffuse abdominal aortic and visceral atherosclerosis. Iliac atherosclerosis also noted. No aneurysm or  occlusive process. No retroperitoneal hemorrhage or acute vascular finding. No adenopathy. Reproductive: Prostate calcifications noted. Other: Small fat containing left inguinal hernia. Intact abdominal wall. Musculoskeletal: Diffuse endplate degenerative changes of the spine and SI joints. IMPRESSION: Hepatosplenomegaly and associated hepatic steatosis. Pancreatic head calcifications from remote pancreatitis. No acute inflammatory process. Abdominal atherosclerosis without aneurysm Normal appendix. No acute intra-abdominal pelvic process by CT. Electronically Signed   By: Judie Petit.  Shick M.D.   On: 04/09/2015 16:01     IMPRESSION AND PLAN:   69 year old male with past medical history of severe COPD, anxiety, hypertension, hyperlipidemia, posttraumatic stress disorder who presented to the hospital due to abdominal pain and also noted to be short of breath.  #1 abdominal pain-the exact etiology of this is unclear. I suspect this is probably musculoskeletal in nature. Patient underwent a CT of the abdomen and pelvis which showed no evidence of acute intra-abdominal pathology. His LFTs are stable, he is afebrile, and he has a normal white cell count. -Patient has been adequately treated in the emergency room with multiple doses of IV narcotics without much improvement of his symptoms. I will start him on some Dilaudid as needed. -I will also start him on some muscle relaxants and Valium and Flexeril. -The patient is not clinically improving would consider getting a gastroenterology consult.  #2 shortness of breath-this is likely related to his severe COPD. -I will place him on some IV steroids, DuoNeb nebs as needed, continue Spiriva, Daliresp.  - pt. Is followed by Dr. Meredeth Ide.   #3 GERD-continue Protonix.  #4 hypertension-continue Coreg, Norvasc.  #5 BPH-continue Flomax.  #6 depression-continue Zoloft  #7 posttraumatic stress disorder-continue Geodon.  #8 hyperlipidemia-continue atorvastatin,  fenofibrate.    All the records are reviewed and case discussed with ED provider. Management plans discussed with the patient, family and they are in agreement.  CODE STATUS: Full  TOTAL TIME TAKING CARE OF THIS PATIENT: 45 minutes.    Houston Siren M.D on 04/09/2015 at 7:47 PM  Between 7am to 6pm - Pager - [phone]  After 6pm go to [email or web address] - password [email or web address] [facility]  [place] [facility]  Office  [phone]  CC: Primary care physician; Mickey Farber, MD

## 2015-04-09 NOTE — ED Notes (Signed)
Pt reports that he has been having rt sided flank and upper abd pain x 2 weeks. Pt was here last night but left before seeing MD. Pt reports he has had worsening sob also, wears home O2 2L continuous. Denies chest pain.

## 2015-04-09 NOTE — ED Provider Notes (Signed)
[facility] Emergency Department Provider Note  ____________________________________________  Time seen: 1310  I have reviewed the triage vital signs and the nursing notes.  History by:  Patient along with his wife.  HISTORY  Chief Complaint Flank Pain; Abdominal Pain; and Shortness of Breath     HPI Robert Ball is a 69 y.o. male who reports he has had pain in his right upper abdomen and flank for approximately 2 weeks. It has been consistent, not intermittent, but worsening through this time. He reports the pain is in the right upper quadrant and goes straight through to his right upper back. He was seen yesterday at [facility] in [facility] and referred to the emergency department. He came to the emergency department by EMS but still had to wait in the waiting room. He left without being treated in the emergency department, telling me today that his pain got too bad to stay. He was called this morning for follow-up by Viviano SimasLiz Gannon and encouraged to return. He now presents with ongoing pain.  He reports one episode of diarrhea this morning, but no other GI symptoms over the past 2 weeks. The pain does not get worse with food or deep breath. As noted above, it is consistent and steady.  Patient does have COPD and uses oxygen at home. He currently appears to be having shortness of breath, though the patient demise is these symptoms.    Past Medical History  Diagnosis Date  . PTSD (post-traumatic stress disorder)   . Hypertension   . Anxiety   . Depression   . Prostate enlargement   . GERD (gastroesophageal reflux disease)   . Headache   . Elevated lipids   . Shakes   . COPD (chronic obstructive pulmonary disease) (HCC)     On 2liter oxygen    There are no active problems to display for this patient.   Past Surgical History  Procedure Laterality Date  . Neck surgery    . Hernia repair    . Hemorrhoid surgery    . Cataract extraction w/ intraocular  lens  implant, bilateral Bilateral   . Eye muscle surgery Bilateral   . Icd lead removal Left 12/27/2014    Procedure: ICD LEAD REMOVAL/ICD GENERATOR CHANGE OUT;  Surgeon: Marcina MillardAlexander Paraschos, MD;  Location: [facility] ORS;  Service: Cardiovascular;  Laterality: Left;    Current Outpatient Rx  Name  Route  Sig  Dispense  Refill  . acetaminophen (TYLENOL) 500 MG tablet   Oral   Take 500 mg by mouth every 6 (six) hours as needed for mild pain or headache.         . albuterol (PROVENTIL HFA;VENTOLIN HFA) 108 (90 BASE) MCG/ACT inhaler   Inhalation   Inhale 2 puffs into the lungs every 6 (six) hours as needed for wheezing or shortness of breath.         Marland Kitchen. albuterol (PROVENTIL) (2.5 MG/3ML) 0.083% nebulizer solution   Nebulization   Take 2.5 mg by nebulization every 6 (six) hours as needed for wheezing or shortness of breath.         Marland Kitchen. amLODipine (NORVASC) 10 MG tablet   Oral   Take 10 mg by mouth daily.         Marland Kitchen. atorvastatin (LIPITOR) 40 MG tablet   Oral   Take 40 mg by mouth daily.         . budesonide-formoterol (SYMBICORT) 160-4.5 MCG/ACT inhaler   Inhalation   Inhale 2 puffs into  the lungs 2 (two) times daily.         . carvedilol (COREG) 12.5 MG tablet   Oral   Take 12.5 mg by mouth 2 (two) times daily.          . Cholecalciferol (VITAMIN D3) 5000 UNITS CAPS   Oral   Take 5,000 Units by mouth every other day.         . dimenhyDRINATE (DRAMAMINE) 50 MG tablet   Oral   Take 50 mg by mouth every 8 (eight) hours as needed for nausea.         . fenofibrate 160 MG tablet   Oral   Take 160 mg by mouth daily.         . furosemide (LASIX) 40 MG tablet   Oral   Take 40 mg by mouth daily.         Marland Kitchen loperamide (IMODIUM) 2 MG capsule   Oral   Take 4 mg by mouth daily.          Marland Kitchen omeprazole (PRILOSEC) 20 MG capsule   Oral   Take 20 mg by mouth 2 (two) times daily.          . potassium chloride SA (K-DUR,KLOR-CON) 20 MEQ tablet   Oral   Take 20 mEq  by mouth daily.         . predniSONE (DELTASONE) 10 MG tablet   Oral   Take 10 mg by mouth daily.          . prochlorperazine (COMPAZINE) 5 MG tablet   Oral   Take 5 mg by mouth 3 (three) times daily as needed for nausea or vomiting.          . roflumilast (DALIRESP) 500 MCG TABS tablet   Oral   Take 500 mcg by mouth daily.         . sertraline (ZOLOFT) 100 MG tablet   Oral   Take 100 mg by mouth 2 (two) times daily.         . tamsulosin (FLOMAX) 0.4 MG CAPS capsule   Oral   Take 0.4 mg by mouth daily.         Marland Kitchen tiotropium (SPIRIVA) 18 MCG inhalation capsule   Inhalation   Place 18 mcg into inhaler and inhale daily.         . traMADol (ULTRAM) 50 MG tablet   Oral   Take 50 mg by mouth 2 (two) times daily as needed for moderate pain.          . ziprasidone (GEODON) 60 MG capsule   Oral   Take 60 mg by mouth at bedtime.           Allergies Aspirin; Ciprofloxacin hcl; Ibuprofen; Metronidazole; Naproxen; and Tetracyclines & related  No family history on file.  Social History Social History  Substance Use Topics  . Smoking status: Former Smoker    Quit date: 09/24/2007  . Smokeless tobacco: None  . Alcohol Use: Yes     Comment: occ    Review of Systems  Constitutional: Negative for fever/chills. ENT: Negative for congestion. Cardiovascular: Negative for chest pain. Respiratory: No change in baseline respiratory function. Patient has COPD. Gastrointestinal: Pain, right upper quadrant through to right back. One episode of diarrhea. No nausea or vomiting. See history of present illness Genitourinary: Negative for dysuria. Musculoskeletal: No back pain. Skin: Negative for rash. Neurological: Negative for headache or focal weakness   10-point ROS otherwise negative.  ____________________________________________  PHYSICAL EXAM:  VITAL SIGNS: ED Triage Vitals  Enc Vitals Group     BP 04/09/15 1212 105/76 mmHg     Pulse Rate 04/09/15  1212 75     Resp 04/09/15 1212 24     Temp 04/09/15 1212 97.7 F (36.5 C)     Temp Source 04/09/15 1212 Oral     SpO2 04/09/15 1212 94 %     Weight 04/09/15 1212 176 lb (79.833 kg)     Height 04/09/15 1212  (1.651 m)     Head Cir --      Peak Flow --      Pain Score 04/09/15 1214 9     Pain Loc --      Pain Edu? --      Excl. in GC? --     Constitutional:  Alert and oriented. Appears short of breath and appears uncomfortable. ENT   Head: Normocephalic and atraumatic.   Nose: No congestion/rhinnorhea.       Mouth: No erythema, no swelling   Cardiovascular: Normal rate, regular rhythm, no murmur noted Respiratory:  Increase work of breathing with mild audible wheezing..    Wheezing on auscultation bilaterally.  Gastrointestinal: Soft, no distention. Notable tenderness in right upper quadrant. No rebound or peritoneal signs. Back: No muscle spasm. Patient reports some discomfort on palpation of his right upper back, but not in the same way as with palpation of his abdomen. Slight but notable CVA tenderness on the right. Musculoskeletal: No deformity noted. Nontender with normal range of motion in all extremities.  No noted edema. Neurologic:  Communicative. Normal appearing spontaneous movement in all 4 extremities. No gross focal neurologic deficits are appreciated.  Skin:  Skin is warm, dry. No rash noted. Psychiatric: Mood and affect are normal. Speech and behavior are normal.  ____________________________________________    LABS (pertinent positives/negatives)  Labs were drawn yesterday at [facility] urgent care. These been reviewed. They were overall normal. This includes electrolytes, renal function, liver enzymes, and blood count.  ____________________________________________   EKG  ED ECG REPORT I, Dayshaun Whobrey W, the attending physician, personally viewed and interpreted this ECG.   Date: 04/09/2015  EKG Time: 1212  Rate: 75  Rhythm: Atrial pacer spike  noted.  Axis: Left axis.  Intervals: Normal  ST&T Change: None noted   ____________________________________________    RADIOLOGY  CT abdomen and pelvis  IMPRESSION: Hepatosplenomegaly and associated hepatic steatosis.  Pancreatic head calcifications from remote pancreatitis. No acute inflammatory process.  Abdominal atherosclerosis without aneurysm  Normal appendix.  No acute intra-abdominal pelvic process by CT.   Chest x-ray:   IMPRESSION: 1. Low lung volumes with mild bibasilar airspace disease likely reflects atelectasis. 2. Atherosclerosis.   ____________________________________________   PROCEDURES  CRITICAL CARE Performed by: Darien Ramus   Total critical care time: 45 minutes due to the complex and broad differential diagnosis, the labored breathing, a run of V. tach, and need for multiple rounds of IV pain medication.  Critical care time was exclusive of separately billable procedures and treating other patients.  Critical care was necessary to treat or prevent imminent or life-threatening deterioration.  Critical care was time spent personally by me on the following activities: development of treatment plan with patient and/or surrogate as well as nursing, discussions with consultants, evaluation of patient's response to treatment, examination of patient, obtaining history from patient or surrogate, ordering and performing treatments and interventions, ordering and review of laboratory studies, ordering and review of radiographic studies, pulse oximetry  and re-evaluation of patient's condition.   ____________________________________________   INITIAL IMPRESSION / ASSESSMENT AND PLAN / ED COURSE  Pertinent labs & imaging results that were available during my care of the patient were reviewed by me and considered in my medical decision making (see chart for details).  Notable pain in right upper abdomen through to right back. I have ordered a CT  scan to evaluate both aorta, liver, gallbladder, intestines. Patient is receiving Dilaudid and Zofran as well as IV fluids.  ----------------------------------------- 4:50 PM on 04/09/2015 -----------------------------------------  CT scan does not show any acute process. Due to the patient's shortness of breath, I also ordered a chest x-ray. This shows low lung volumes with mild bi-basilar airspace disease, likely atelectasis.  Patient has had ongoing pain in the emergency department. He received his first dose of Dilaudid, which only helped a little. He received 1 mg of Dilaudid following that. Now, on reexamination, he appears to be notably uncomfortable. He will need further pain medication.   On repeat exam, he does not have any tenderness in the lateral wall of the chest. He has moderate tenderness in the anterior abdomen, but now, he points more to his back. There is no rash or indication of shingles.  It is unclear what is causing this pain for him. He does have a history of cervical surgery on C6. I wonder if this pain could be a radiculopathy. He also has calcifications on the pancreatic head. This is reported to be likely connected with remote pancreatitis by the radiologist's interpretation, however the patient does not have a history of pancreatitis. I wonder if this may be involved with an acute process. Lipase yesterday was normal.  I suspect the patient will need to be admitted to the hospital for ongoing pain control. Was blood tests were negative yesterday, given his ongoing progression of pain and his need for further evaluation, I will repeat the blood tests at this time.  ----------------------------------------- 6:17 PM on 04/09/2015 -----------------------------------------  On reexamination, the patient was still having notable pain, 8-9/10. We gave him a fourth dose of Dilaudid and a dose of Solu-Medrol, 80 mg. I've ordered a Lidoderm patch. If this helps with pain  control we will consider discharging him home.  ----------------------------------------- 7:29 PM on 04/09/2015 -----------------------------------------  Patient is still having significant pain, proximal 8/10. He continues to have breathing that appears to be labored, though he reports this is his usual breathing. He had a run of V. tach at approximately 1850 just came to my attention approximate 10 minutes ago. On reexamination, the patient has a regular rhythm.   Due to his ongoing pain, unclear diagnosis, and a run of V. tach, we will admit him to the hospital for further observation and evaluation. This is been discussed with Dr. Quentin Cornwall.   ____________________________________________   FINAL CLINICAL IMPRESSION(S) / ED DIAGNOSES  Final diagnoses:  Abdominal pain, right upper quadrant  Acute right flank pain  Advanced COPD (HCC)  Ventricular tachycardia (HCC)      Darien Ramus, MD 04/09/15 1931

## 2015-04-09 NOTE — ED Notes (Signed)
2L O2 via [place] applied.

## 2015-04-10 ENCOUNTER — Observation Stay: Payer: Medicare PPO

## 2015-04-10 LAB — BASIC METABOLIC PANEL
ANION GAP: 8 (ref 5–15)
BUN: 12 mg/dL (ref 6–20)
CALCIUM: 8.9 mg/dL (ref 8.9–10.3)
CO2: 21 mmol/L — ABNORMAL LOW (ref 22–32)
Chloride: 108 mmol/L (ref 101–111)
Creatinine, Ser: 1.26 mg/dL — ABNORMAL HIGH (ref 0.61–1.24)
GFR calc Af Amer: >60 mL/min (ref >60)
GFR, EST NON AFRICAN AMERICAN: 56 mL/min — AB (ref >60)
GLUCOSE: 181 mg/dL — AB (ref 65–99)
Potassium: 3.7 mmol/L (ref 3.5–5.1)
SODIUM: 137 mmol/L (ref 135–145)

## 2015-04-10 LAB — CBC
HCT: 41.3 % (ref 40.0–52.0)
Hemoglobin: 13.8 g/dL (ref 13.0–18.0)
MCH: 31.4 pg (ref 26.0–34.0)
MCHC: 33.4 g/dL (ref 32.0–36.0)
MCV: 93.8 fL (ref 80.0–100.0)
Platelets: 144 10*3/uL — ABNORMAL LOW (ref 150–440)
RBC: 4.41 MIL/uL (ref 4.40–5.90)
RDW: 14.1 % (ref 11.5–14.5)
WBC: 6.1 10*3/uL (ref 3.8–10.6)

## 2015-04-10 MED ORDER — PANTOPRAZOLE SODIUM 40 MG PO TBEC
40.0000 mg | DELAYED_RELEASE_TABLET | Freq: Once | ORAL | Status: AC
  Administered 2015-04-11: 40 mg via ORAL
  Filled 2015-04-10: qty 1

## 2015-04-10 NOTE — Progress Notes (Signed)
Initial Nutrition Assessment     INTERVENTION:  Meals and snacks: Cater to pt preferences    NUTRITION DIAGNOSIS:   Inadequate oral intake related to acute illness as evidenced by per patient/family report.    GOAL:   Patient will meet greater than or equal to 90% of their needs    MONITOR:    (Energy intake, Digestive system)  REASON FOR ASSESSMENT:   Malnutrition Screening Tool    ASSESSMENT:   Pt admitted with abdominal pain, back pain, shortness of breath  Past Medical History  Diagnosis Date  . PTSD (post-traumatic stress disorder)   . Hypertension   . Anxiety   . Depression   . Prostate enlargement   . GERD (gastroesophageal reflux disease)   . Headache   . Elevated lipids   . Shakes   . COPD (chronic obstructive pulmonary disease) (HCC)     On 2liter oxygen    Current Nutrition: ate 100% of grilled cheese sandwich for lunch today, tray observed and pt tolerated well  Food/Nutrition-Related History: pt reports for the past 1 weeks intake has been poor secondary no taste for food. Did have back pain but reports that did not effect his appetite   Scheduled Medications:  . amLODipine  10 mg Oral Daily  . amoxicillin-clavulanate  1 tablet Oral BID  . atorvastatin  40 mg Oral QHS  . budesonide-formoterol  2 puff Inhalation BID  . carvedilol  12.5 mg Oral BID WC  . cholecalciferol  5,000 Units Oral QODAY  . enoxaparin (LOVENOX) injection  40 mg Subcutaneous QHS  . fenofibrate  160 mg Oral Daily  . furosemide  40 mg Oral Daily  . lidocaine  1 patch Transdermal Q24H  . methylPREDNISolone (SOLU-MEDROL) injection  40 mg Intravenous Q12H  . pantoprazole  40 mg Oral Q breakfast  . potassium chloride SA  20 mEq Oral Daily  . roflumilast  500 mcg Oral Daily  . sertraline  100 mg Oral BID  . tamsulosin  0.4 mg Oral Daily  . tiotropium  18 mcg Inhalation Daily  . ziprasidone  60 mg Oral QHS       Electrolyte/Renal Profile and Glucose Profile:    Recent Labs Lab 04/08/15 1540 04/09/15 1715 04/10/15 0547  NA 131* 136 137  K 3.8 3.7 3.7  CL 102 106 108  CO2 19* 24 21*  BUN 11 13 12   CREATININE 1.24 1.31* 1.26*  CALCIUM 9.4 8.8* 8.9  GLUCOSE 96 117* 181*   Protein Profile:  Recent Labs Lab 04/08/15 1540 04/09/15 1715  ALBUMIN 4.1 3.7    Gastrointestinal Profile: noted constipation prior to admission Last BM:12/7   Nutrition-Focused Physical Exam Findings: Nutrition-Focused physical exam completed. Findings are no fat depletion, no muscle depletion, and no edema.      Weight Change: pt reports 12 pounds in the last week but per wt encounters wt 175 on 12/2014 and current wt of 176 pounds    Diet Order:  Diet Heart Room service appropriate?: Yes; Fluid consistency:: Thin  Skin:   reviewed    Height:   Ht Readings from Last 1 Encounters:  04/09/15 5\' 5"  (1.651 m)    Weight:   Wt Readings from Last 1 Encounters:  04/09/15 176 lb (79.833 kg)    Ideal Body Weight:     BMI:  Body mass index is 29.29 kg/(m^2).  EDUCATION NEEDS:   No education needs identified at this time  LOW Care Level  Tajanay Hurley B. [name], RD, LDN  [phone] (pager)

## 2015-04-10 NOTE — Care Management Obs Status (Signed)
MEDICARE OBSERVATION STATUS NOTIFICATION   Patient Details  Name: Robert AgarBilly R Dayrit MRN: [id number] Date of Birth: 03-28-1946   Medicare Observation Status Notification Given:  Yes    Adonis HugueninBerkhead, Hadrian Yarbrough L, RN 04/10/2015, 10:34 AM

## 2015-04-10 NOTE — Progress Notes (Signed)
[facility] [place] at [facility]   PATIENT NAME: Robert Ball    MR#:  [id number]  DATE OF BIRTH:  06-12-1945  SUBJECTIVE:  CHIEF COMPLAINT:  Patient is reporting severe bilateral mid and lower back pain radiating to the lower quadrant of the abdomen. Associated with nausea. Patient was seen by Dr. Thedore Mins, nephrologist  3 days ago and patient was started on Augmentin for abnormal urinalysis for possible UTI. Denies any fever but very uncomfortable. Reports chronic low back pain  REVIEW OF SYSTEMS:  CONSTITUTIONAL: No fever, fatigue or weakness.  EYES: No blurred or double vision.  EARS, NOSE, AND THROAT: No tinnitus or ear pain.  RESPIRATORY: No cough, shortness of breath, wheezing or hemoptysis.  CARDIOVASCULAR: No chest pain, orthopnea, edema.  GASTROINTESTINAL: No nausea, vomiting, diarrhea or abdominal pain.  GENITOURINARY: No dysuria, hematuria.  ENDOCRINE: No polyuria, nocturia,  HEMATOLOGY: No anemia, easy bruising or bleeding SKIN: No rash or lesion. MUSCULOSKELETAL: Reporting mid to low back pain bilaterally No joint pain or arthritis.   NEUROLOGIC: No tingling, numbness, weakness.  PSYCHIATRY: No anxiety or depression.   DRUG ALLERGIES:   Allergies  Allergen Reactions  . Aspirin Other (See Comments)    Pt is unable to take this medication.    . Ciprofloxacin Hcl Other (See Comments)    Reaction:  Unknown   . Ibuprofen Other (See Comments)    Pt is unable to take this medication.    . Metronidazole Other (See Comments)    Reaction:  Unknown   . Naproxen Other (See Comments)    Pt is unable to take this medication.    . Tetracyclines & Related Other (See Comments)    Reaction:  Unknown     VITALS:  Blood pressure 112/68, pulse 70, temperature 98 F (36.7 C), temperature source Oral, resp. rate 17, height  (1.651 m), weight 79.833 kg (176 lb), SpO2 100 %.  PHYSICAL EXAMINATION:  GENERAL:  69 y.o.-year-old patient lying in the bed  with no acute distress.  EYES: Pupils equal, round, reactive to light and accommodation. No scleral icterus. Extraocular muscles intact.  HEENT: Head atraumatic, normocephalic. Oropharynx and nasopharynx clear.  NECK:  Supple, no jugular venous distention. No thyroid enlargement, no tenderness.  LUNGS: Normal breath sounds bilaterally, no wheezing, rales,rhonchi or crepitation. No use of accessory muscles of respiration.  CARDIOVASCULAR: S1, S2 normal. No murmurs, rubs, or gallops.  ABDOMEN: Soft, nontender, nondistended. Bowel sounds present. No organomegaly or mass. Bilateral lower abdominal tenderness associated with bilateral flank pain EXTREMITIES: No pedal edema, cyanosis, or clubbing.  NEUROLOGIC: Cranial nerves II through XII are intact. Muscle strength 5/5 in all extremities. Sensation intact. Gait not checked.  PSYCHIATRIC: The patient is alert and oriented x 3.  SKIN: No obvious rash, lesion, or ulcer.    LABORATORY PANEL:   CBC  Recent Labs Lab 04/10/15 0547  WBC 6.1  HGB 13.8  HCT 41.3  PLT 144*   ------------------------------------------------------------------------------------------------------------------  Chemistries   Recent Labs Lab 04/09/15 1715 04/10/15 0547  NA 136 137  K 3.7 3.7  CL 106 108  CO2 24 21*  GLUCOSE 117* 181*  BUN 13 12  CREATININE 1.31* 1.26*  CALCIUM 8.8* 8.9  AST 21  --   ALT 14*  --   ALKPHOS 24*  --   BILITOT 0.8  --    ------------------------------------------------------------------------------------------------------------------  Cardiac Enzymes  Recent Labs Lab 04/09/15 1715  TROPONINI <0.03   ------------------------------------------------------------------------------------------------------------------  RADIOLOGY:  Dg Chest  1 View  04/09/2015  CLINICAL DATA:  Shortness breath and right-sided flank pain since last evening. EXAM: CHEST  1 VIEW COMPARISON:  Two-view chest x-ray 04/08/2015. FINDINGS: Low lung  volumes exaggerate the heart size. Pacing and AICD wires are stable. Atherosclerotic calcifications are present at the aortic arch. Mild bibasilar airspace disease likely reflects atelectasis. No airspace consolidation is present otherwise. The visualized soft tissues and bony thorax are unremarkable. IMPRESSION: 1. Low lung volumes with mild bibasilar airspace disease likely reflects atelectasis. 2. Atherosclerosis. Electronically Signed   By: Marin Robertshristopher  Mattern M.D.   On: 04/09/2015 15:15   Dg Chest 2 View  04/08/2015  CLINICAL DATA:  Shortness of breath and cough for 1 week EXAM: CHEST - 2 VIEW COMPARISON:  12/25/2014 FINDINGS: Defibrillator is again seen and stable. Cardiac shadow is within normal limits. Aortic calcifications are noted. The lungs are well aerated bilaterally without focal infiltrate or sizable effusion. No acute bony abnormality is noted. IMPRESSION: No active disease. Electronically Signed   By: Alcide CleverMark  Lukens M.D.   On: 04/08/2015 16:53   Ct Abdomen Pelvis W Contrast  04/09/2015  CLINICAL DATA:  Acute right flank and upper abdominal pain for 2 weeks. EXAM: CT ABDOMEN AND PELVIS WITH CONTRAST TECHNIQUE: Multidetector CT imaging of the abdomen and pelvis was performed using the standard protocol following bolus administration of intravenous contrast. CONTRAST:  100mL OMNIPAQUE IOHEXOL 300 MG/ML  SOLN COMPARISON:  04/06/2012 FINDINGS: Lower chest: Minor bibasilar atelectasis versus scarring. No lower lobe pneumonia. Pacer wires in the right side of the heart. Normal heart size. No pericardial or pleural effusion. Lower thoracic atherosclerosis noted. Degenerative osteophytes of the lower thoracic spine on the right. Hepatobiliary: Diffuse hypoattenuation of the liver parenchyma compatible with hepatic steatosis. Liver measures 17.2 cm in craniocaudal dimension. No focal hepatic abnormality or biliary dilatation. Patent hepatic and portal veins. Collapsed gallbladder and biliary system  unremarkable. No biliary dilatation. Pancreas: Calcifications of the pancreatic head from prior inflammation. Otherwise normal appearing pancreas for an 8. No ductal dilatation or acute inflammatory process. Spleen: No focal abnormality. Spleen is enlarged with a length of 16.9 cm. Splenic vein is patent. Adrenals/Urinary Tract: Normal adrenal glands for age. Kidneys demonstrate no acute obstruction, hydronephrosis, perinephric inflammatory process. Ureters are symmetric and decompressed. No obstructing ureteral calculus on either side. Renal atherosclerosis noted. Stomach/Bowel: Negative for obstruction, dilatation, or ileus. Normal appendix. No free air evident. Vascular/Lymphatic: Diffuse abdominal aortic and visceral atherosclerosis. Iliac atherosclerosis also noted. No aneurysm or occlusive process. No retroperitoneal hemorrhage or acute vascular finding. No adenopathy. Reproductive: Prostate calcifications noted. Other: Small fat containing left inguinal hernia. Intact abdominal wall. Musculoskeletal: Diffuse endplate degenerative changes of the spine and SI joints. IMPRESSION: Hepatosplenomegaly and associated hepatic steatosis. Pancreatic head calcifications from remote pancreatitis. No acute inflammatory process. Abdominal atherosclerosis without aneurysm Normal appendix. No acute intra-abdominal pelvic process by CT. Electronically Signed   By: Judie PetitM.  Shick M.D.   On: 04/09/2015 16:01    EKG:   Orders placed or performed during the hospital encounter of 04/08/15  . ED EKG  . ED EKG    ASSESSMENT AND PLAN:   69 year old male with past medical history of severe COPD, anxiety, hypertension, hyperlipidemia, posttraumatic stress disorder who presented to the hospital due to abdominal pain and also noted to be short of breath.  #1 acute nonsurgical lower bilateral abdominal pain with flank pain-the exact etiology of this is unclear, . I suspect this is probably musculoskeletal in nature  With  underlying  possible UTI -Patient underwent a CT of the abdomen and pelvis which showed no evidence of acute intra-abdominal pathology. His LFTs are stable, he is afebrile, and he has a normal white cell count. - I will  Continue Dilaudid as needed with muscle relaxants and Valium  -Consult is placed to orthopedics -The patient is not clinically improving would consider getting a gastroenterology consult.  #2 shortness of breath-this is likely related to his severe COPD.  on  IV steroids, DuoNeb nebs as needed, continue Spiriva, Daliresp.  - pt. Is followed by Dr. Meredeth Ide.   #3 GERD-continue Protonix.  #4 hypertension-continue Coreg, Norvasc.  #5 BPH-continue Flomax.  #6 depression-continue Zoloft  #7 posttraumatic stress disorder-continue Geodon.  #8 hyperlipidemia-continue atorvastatin, fenofibrate.      All the records are reviewed and case discussed with Care Management/Social Workerr. Management plans discussed with the patient, family and they are in agreement.  CODE STATUS: fc  TOTAL TIME TAKING CARE OF THIS PATIENT: 35 minutes.   POSSIBLE D/C IN 2-3 DAYS, DEPENDING ON CLINICAL CONDITION.   Ramonita Lab M.D on 04/10/2015 at 4:05 PM  Between 7am to 6pm - Pager - [phone] After 6pm go to [email or web address] - password [email or web address] [facility]  [facility]  Office  [phone]  CC: Primary care physician; Mickey Farber, MD

## 2015-04-10 NOTE — Consult Note (Signed)
ORTHOPAEDIC CONSULTATION  REQUESTING PHYSICIAN: Ramonita LabAruna Gouru, MD  Chief Complaint: Lumbar back pain  HPI: Robert Ball is a 69 y.o. male who complains of abdominal pain which radiates to the low back.  He is seen in his hospital room with his wife at the bedside. Patient states she has a history of degenerative disc disease. Patient denies any numbness or tingling or bowel or bladder dysfunction. His pain began spontaneously approximately 1 week ago without antecedent injury.  Past Medical History  Diagnosis Date  . PTSD (post-traumatic stress disorder)   . Hypertension   . Anxiety   . Depression   . Prostate enlargement   . GERD (gastroesophageal reflux disease)   . Headache   . Elevated lipids   . Shakes   . COPD (chronic obstructive pulmonary disease) (HCC)     On 2liter oxygen   Past Surgical History  Procedure Laterality Date  . Neck surgery    . Hernia repair    . Hemorrhoid surgery    . Cataract extraction w/ intraocular lens  implant, bilateral Bilateral   . Eye muscle surgery Bilateral   . Icd lead removal Left 12/27/2014    Procedure: ICD LEAD REMOVAL/ICD GENERATOR CHANGE OUT;  Surgeon: Marcina MillardAlexander Paraschos, MD;  Location: [facility] ORS;  Service: Cardiovascular;  Laterality: Left;   Social History   Social History  . Marital Status: Married    Spouse Name: N/A  . Number of Children: N/A  . Years of Education: N/A   Social History Main Topics  . Smoking status: Former Smoker -- 5.00 packs/day for 40 years    Types: Cigarettes    Quit date: 09/24/2007  . Smokeless tobacco: None  . Alcohol Use: No     Comment: occ  . Drug Use: No  . Sexual Activity: Not Asked   Other Topics Concern  . None   Social History Narrative   Family History  Problem Relation Age of Onset  . Heart disease Father   . Heart disease Mother   . Lymphoma Father    Allergies  Allergen Reactions  . Aspirin Other (See Comments)    Pt is unable to take this medication.    .  Ciprofloxacin Hcl Other (See Comments)    Reaction:  Unknown   . Ibuprofen Other (See Comments)    Pt is unable to take this medication.    . Metronidazole Other (See Comments)    Reaction:  Unknown   . Naproxen Other (See Comments)    Pt is unable to take this medication.    . Tetracyclines & Related Other (See Comments)    Reaction:  Unknown    Prior to Admission medications   Medication Sig Start Date End Date Taking? Authorizing Provider  acetaminophen (TYLENOL) 500 MG tablet Take 500 mg by mouth every 6 (six) hours as needed for mild pain or headache.   Yes Historical Provider, MD  albuterol (PROVENTIL HFA;VENTOLIN HFA) 108 (90 BASE) MCG/ACT inhaler Inhale 2 puffs into the lungs every 6 (six) hours as needed for wheezing or shortness of breath.   Yes Historical Provider, MD  albuterol (PROVENTIL) (2.5 MG/3ML) 0.083% nebulizer solution Take 2.5 mg by nebulization every 6 (six) hours as needed for wheezing or shortness of breath.   Yes Historical Provider, MD  amLODipine (NORVASC) 10 MG tablet Take 10 mg by mouth daily.   Yes Historical Provider, MD  amoxicillin-clavulanate (AUGMENTIN) 500-125 MG tablet Take 1 tablet by mouth 2 (two) times daily. 04/08/15 04/15/15  Yes Historical Provider, MD  atorvastatin (LIPITOR) 40 MG tablet Take 40 mg by mouth daily.   Yes Historical Provider, MD  budesonide-formoterol (SYMBICORT) 160-4.5 MCG/ACT inhaler Inhale 2 puffs into the lungs 2 (two) times daily.   Yes Historical Provider, MD  carvedilol (COREG) 12.5 MG tablet Take 12.5 mg by mouth 2 (two) times daily.    Yes Historical Provider, MD  Cholecalciferol (VITAMIN D3) 5000 UNITS CAPS Take 5,000 Units by mouth every other day.   Yes Historical Provider, MD  dimenhyDRINATE (DRAMAMINE) 50 MG tablet Take 50 mg by mouth every 8 (eight) hours as needed for nausea.   Yes Historical Provider, MD  fenofibrate 160 MG tablet Take 160 mg by mouth daily.   Yes Historical Provider, MD  furosemide (LASIX) 40 MG  tablet Take 40 mg by mouth daily.   Yes Historical Provider, MD  loperamide (IMODIUM) 2 MG capsule Take 4 mg by mouth daily.    Yes Historical Provider, MD  omeprazole (PRILOSEC) 20 MG capsule Take 20 mg by mouth 2 (two) times daily.    Yes Historical Provider, MD  potassium chloride SA (K-DUR,KLOR-CON) 20 MEQ tablet Take 20 mEq by mouth daily.   Yes Historical Provider, MD  predniSONE (DELTASONE) 10 MG tablet Take 10 mg by mouth daily.    Yes Historical Provider, MD  prochlorperazine (COMPAZINE) 5 MG tablet Take 5 mg by mouth 3 (three) times daily as needed for nausea or vomiting.    Yes Historical Provider, MD  roflumilast (DALIRESP) 500 MCG TABS tablet Take 500 mcg by mouth daily.   Yes Historical Provider, MD  sertraline (ZOLOFT) 100 MG tablet Take 100 mg by mouth 2 (two) times daily.   Yes Historical Provider, MD  tamsulosin (FLOMAX) 0.4 MG CAPS capsule Take 0.4 mg by mouth daily.   Yes Historical Provider, MD  tiotropium (SPIRIVA) 18 MCG inhalation capsule Place 18 mcg into inhaler and inhale daily.   Yes Historical Provider, MD  traMADol (ULTRAM) 50 MG tablet Take 50 mg by mouth 2 (two) times daily as needed for moderate pain.    Yes Historical Provider, MD  ziprasidone (GEODON) 60 MG capsule Take 60 mg by mouth at bedtime.   Yes Historical Provider, MD   Dg Chest 1 View  04/09/2015  CLINICAL DATA:  Shortness breath and right-sided flank pain since last evening. EXAM: CHEST  1 VIEW COMPARISON:  Two-view chest x-ray 04/08/2015. FINDINGS: Low lung volumes exaggerate the heart size. Pacing and AICD wires are stable. Atherosclerotic calcifications are present at the aortic arch. Mild bibasilar airspace disease likely reflects atelectasis. No airspace consolidation is present otherwise. The visualized soft tissues and bony thorax are unremarkable. IMPRESSION: 1. Low lung volumes with mild bibasilar airspace disease likely reflects atelectasis. 2. Atherosclerosis. Electronically Signed   By:  Marin Roberts M.D.   On: 04/09/2015 15:15   Ct Abdomen Pelvis W Contrast  04/09/2015  CLINICAL DATA:  Acute right flank and upper abdominal pain for 2 weeks. EXAM: CT ABDOMEN AND PELVIS WITH CONTRAST TECHNIQUE: Multidetector CT imaging of the abdomen and pelvis was performed using the standard protocol following bolus administration of intravenous contrast. CONTRAST:  OMNIPAQUE IOHEXOL 300 MG/ML  SOLN COMPARISON:  04/06/2012 FINDINGS: Lower chest: Minor bibasilar atelectasis versus scarring. No lower lobe pneumonia. Pacer wires in the right side of the heart. Normal heart size. No pericardial or pleural effusion. Lower thoracic atherosclerosis noted. Degenerative osteophytes of the lower thoracic spine on the right. Hepatobiliary: Diffuse hypoattenuation of the liver  parenchyma compatible with hepatic steatosis. Liver measures 17.2 cm in craniocaudal dimension. No focal hepatic abnormality or biliary dilatation. Patent hepatic and portal veins. Collapsed gallbladder and biliary system unremarkable. No biliary dilatation. Pancreas: Calcifications of the pancreatic head from prior inflammation. Otherwise normal appearing pancreas for an 8. No ductal dilatation or acute inflammatory process. Spleen: No focal abnormality. Spleen is enlarged with a length of 16.9 cm. Splenic vein is patent. Adrenals/Urinary Tract: Normal adrenal glands for age. Kidneys demonstrate no acute obstruction, hydronephrosis, perinephric inflammatory process. Ureters are symmetric and decompressed. No obstructing ureteral calculus on either side. Renal atherosclerosis noted. Stomach/Bowel: Negative for obstruction, dilatation, or ileus. Normal appendix. No free air evident. Vascular/Lymphatic: Diffuse abdominal aortic and visceral atherosclerosis. Iliac atherosclerosis also noted. No aneurysm or occlusive process. No retroperitoneal hemorrhage or acute vascular finding. No adenopathy. Reproductive: Prostate calcifications noted.  Other: Small fat containing left inguinal hernia. Intact abdominal wall. Musculoskeletal: Diffuse endplate degenerative changes of the spine and SI joints. IMPRESSION: Hepatosplenomegaly and associated hepatic steatosis. Pancreatic head calcifications from remote pancreatitis. No acute inflammatory process. Abdominal atherosclerosis without aneurysm Normal appendix. No acute intra-abdominal pelvic process by CT. Electronically Signed   By: Judie Petit.  Shick M.D.   On: 04/09/2015 16:01    Positive ROS: All other systems have been reviewed and were otherwise negative with the exception of those mentioned in the HPI and as above.  Physical Exam: General: Alert, no acute distress  MUSCULOSKELETAL: Patient has a protuberant abdomen. The abdomen is soft and shows no evidence of an acute abdomen. Patient hadn't tenderness in the paraspinal muscles of the upper lumbar spine.  He had minimal tenderness over the posterior spinous processes however. He has intact sensation light touch throughout the bilateral lower extremities and palpable pedal pulses. He has intact motor function with 5 out of 5 strength in all muscle groups of the bilateral lower extremity. Hip flexion and straight leg raising did not cause severe pain in the lumbar spine. He had mild to moderate pain with these maneuvers.  Assessment: Lumbar back pain secondary to degenerative disc disease versus lumbar strain  Plan: I reviewed the patient's abdominal CT scan. The sagittal images show posterior narrowing between L4 and 5. There is no evidence of compression fracture. There is no spondylolisthesis. Patient has no abnormal curvature on the AP. Patient does not have complaints of radicular symptoms. There are no neurologic deficits. Patient is unable to have an MRI due to a pacemaker. Thoracic and lumbar spine films are pending. This appears to be a pain management issue and I recommend consultation to the pain management service.  He may be a candidate  for corticosteroid injection. I'm also ordering physical therapy consultation for evaluation and management. Continue to treat him symptomatically. I have ordered a lumbosacral corset to help support the lower back when he is sitting upright or out of bed. I do not have spine expertise and have nothing further to offer this patient and will be signing off for now.   Juanell Fairly, MD    04/10/2015 5:41 PM

## 2015-04-10 NOTE — Care Management (Signed)
Spoke with patient and spouse at the bedside. Patient is alert and oriented but is visibly in discomfort. Stated that his pain is in his right upper quadrant and radiates to his back. Rated it at about an 8/10. Patient stated that he has chronic home o2 with [facility]. Explained Obs letter to patient and spouse and gave my contact information. Patient stated that he does not use a walker or assistive device in the home and that he normally drives self and is independent. Spoke with patient nurse Josh after leaving room and informed him of patient pain rating. RN stated he would check and see if patient could have something else for pain. Continue to follow.

## 2015-04-11 LAB — CBC
HEMATOCRIT: 40.6 % (ref 40.0–52.0)
HEMOGLOBIN: 13.6 g/dL (ref 13.0–18.0)
MCH: 31.6 pg (ref 26.0–34.0)
MCHC: 33.5 g/dL (ref 32.0–36.0)
MCV: 94.1 fL (ref 80.0–100.0)
Platelets: 144 10*3/uL — ABNORMAL LOW (ref 150–440)
RBC: 4.31 MIL/uL — ABNORMAL LOW (ref 4.40–5.90)
RDW: 14.5 % (ref 11.5–14.5)
WBC: 5.1 10*3/uL (ref 3.8–10.6)

## 2015-04-11 LAB — COMPREHENSIVE METABOLIC PANEL
ALBUMIN: 3.6 g/dL (ref 3.5–5.0)
ALK PHOS: 22 U/L — AB (ref 38–126)
ALT: 12 U/L — ABNORMAL LOW (ref 17–63)
ANION GAP: 7 (ref 5–15)
AST: 17 U/L (ref 15–41)
BILIRUBIN TOTAL: 0.8 mg/dL (ref 0.3–1.2)
BUN: 15 mg/dL (ref 6–20)
CALCIUM: 9.2 mg/dL (ref 8.9–10.3)
CO2: 27 mmol/L (ref 22–32)
Chloride: 105 mmol/L (ref 101–111)
Creatinine, Ser: 1.28 mg/dL — ABNORMAL HIGH (ref 0.61–1.24)
GFR calc Af Amer: >60 mL/min (ref >60)
GFR calc non Af Amer: 55 mL/min — ABNORMAL LOW (ref >60)
GLUCOSE: 109 mg/dL — AB (ref 65–99)
Potassium: 3.8 mmol/L (ref 3.5–5.1)
Sodium: 139 mmol/L (ref 135–145)
TOTAL PROTEIN: 6.6 g/dL (ref 6.5–8.1)

## 2015-04-11 MED ORDER — CYCLOBENZAPRINE HCL 5 MG PO TABS: 5.0000 mg | ORAL_TABLET | Freq: Three times a day (TID) | ORAL | Status: DC | PRN

## 2015-04-11 MED ORDER — PREDNISONE 10 MG (21) PO TBPK: 10.0000 mg | ORAL_TABLET | Freq: Every day | ORAL | Status: DC

## 2015-04-11 MED ORDER — LORAZEPAM 1 MG PO TABS: 1.0000 mg | ORAL_TABLET | Freq: Three times a day (TID) | ORAL | Status: DC | PRN

## 2015-04-11 MED ORDER — OXYCODONE-ACETAMINOPHEN 5-325 MG PO TABS: 1.0000 | ORAL_TABLET | Freq: Four times a day (QID) | ORAL | Status: DC | PRN

## 2015-04-11 NOTE — Discharge Summary (Signed)
[facility] - [place] at [facility]   PATIENT NAME: Robert Ball    MR#:  [id number]  DATE OF BIRTH:  Dec 12, 1945  DATE OF ADMISSION:  04/09/2015 ADMITTING PHYSICIAN: Houston Siren, MD  DATE OF DISCHARGE: 04/11/2015 PRIMARY CARE PHYSICIAN: THIES, DAVID, MD    ADMISSION DIAGNOSIS:  Abdominal pain, right upper quadrant [R10.11] Ventricular tachycardia (HCC) [I47.2] Acute right flank pain [R10.11] Advanced COPD (HCC) [J44.9]  DISCHARGE DIAGNOSIS:  Active Problems:   Abdominal pain   SECONDARY DIAGNOSIS:   Past Medical History  Diagnosis Date  . PTSD (post-traumatic stress disorder)   . Hypertension   . Anxiety   . Depression   . Prostate enlargement   . GERD (gastroesophageal reflux disease)   . Headache   . Elevated lipids   . Shakes   . COPD (chronic obstructive pulmonary disease) (HCC)     On 2liter oxygen    HOSPITAL COURSE:  69 year old male with past medical history of severe COPD, anxiety, hypertension, hyperlipidemia, posttraumatic stress disorder who presented to the hospital due to abdominal pain and also noted to be short of breath.  #1 acute nonsurgical lower bilateral abdominal pain with flank pain-the exact etiology of this is unclear, . I suspect this is probably musculoskeletal in nature With underlying possible UTI -Patient underwent a CT of the abdomen and pelvis which showed no evidence of acute intra-abdominal pathology. His LFTs are stable, he is afebrile, and he has a normal white cell count. - pain meds prn  as needed with muscle relaxants and Valium - ortho has recommended LS corset ,PT , op painmanagement f/u for possible corticosteroid injection    #2 shortness of breath-this is likely related to his severe COPD. improved with IV steroids,changed to po prednisone, DuoNeb nebs as needed, continue Spiriva, Daliresp.  - pt.is to f/u with Dr. Meredeth Ide, his pulmonologist after d/c  #3 GERD-continue Protonix.  #4  hypertension-continue Coreg, Norvasc.  #5 BPH-continue Flomax.  #6 depression-continue Zoloft  #7 posttraumatic stress disorder-continue Geodon.  #8 hyperlipidemia-continue atorvastatin, fenofibrate.  diso- home with op pt    DISCHARGE CONDITIONS:   fair  CONSULTS OBTAINED:  Treatment Team:  Ramonita Lab, MD Juanell Fairly, MD   PROCEDURES none   DRUG ALLERGIES:   Allergies  Allergen Reactions  . Aspirin Other (See Comments)    Pt is unable to take this medication.    . Ciprofloxacin Hcl Other (See Comments)    Reaction:  Unknown   . Ibuprofen Other (See Comments)    Pt is unable to take this medication.    . Metronidazole Other (See Comments)    Reaction:  Unknown   . Naproxen Other (See Comments)    Pt is unable to take this medication.    . Tetracyclines & Related Other (See Comments)    Reaction:  Unknown     DISCHARGE MEDICATIONS:   Current Discharge Medication List    START taking these medications   Details  cyclobenzaprine (FLEXERIL) 5 MG tablet Take 1 tablet (5 mg total) by mouth 3 (three) times daily as needed for muscle spasms. Qty: 30 tablet, Refills: 0    LORazepam (ATIVAN) 1 MG tablet Take 1 tablet (1 mg total) by mouth every 8 (eight) hours as needed for anxiety. Qty: 30 tablet, Refills: 0    oxyCODONE-acetaminophen (ROXICET) 5-325 MG tablet Take 1-2 tablets by mouth every 6 (six) hours as needed for moderate pain or severe pain. Qty: 30 tablet, Refills: 0  predniSONE (STERAPRED UNI-PAK 21 TAB) 10 MG (21) TBPK tablet Take 1 tablet (10 mg total) by mouth daily. Take 6 tablets by mouth for 1 day followed by  5 tablets by mouth for 1 day followed by  4 tablets by mouth for 1 day followed by  3 tablets by mouth for 1 day followed by  2 tablets by mouth for 1 day followed by  1 tablet by mouth for a day and stop Qty: 21 tablet, Refills: 0      CONTINUE these medications which have NOT CHANGED   Details  acetaminophen (TYLENOL) 500 MG  tablet Take 500 mg by mouth every 6 (six) hours as needed for mild pain or headache.    albuterol (PROVENTIL HFA;VENTOLIN HFA) 108 (90 BASE) MCG/ACT inhaler Inhale 2 puffs into the lungs every 6 (six) hours as needed for wheezing or shortness of breath.    albuterol (PROVENTIL) (2.5 MG/3ML) 0.083% nebulizer solution Take 2.5 mg by nebulization every 6 (six) hours as needed for wheezing or shortness of breath.    amLODipine (NORVASC) 10 MG tablet Take 10 mg by mouth daily.    amoxicillin-clavulanate (AUGMENTIN) 500-125 MG tablet Take 1 tablet by mouth 2 (two) times daily.    atorvastatin (LIPITOR) 40 MG tablet Take 40 mg by mouth daily.    budesonide-formoterol (SYMBICORT) 160-4.5 MCG/ACT inhaler Inhale 2 puffs into the lungs 2 (two) times daily.    carvedilol (COREG) 12.5 MG tablet Take 12.5 mg by mouth 2 (two) times daily.     Cholecalciferol (VITAMIN D3) 5000 UNITS CAPS Take 5,000 Units by mouth every other day.    dimenhyDRINATE (DRAMAMINE) 50 MG tablet Take 50 mg by mouth every 8 (eight) hours as needed for nausea.    fenofibrate 160 MG tablet Take 160 mg by mouth daily.    furosemide (LASIX) 40 MG tablet Take 40 mg by mouth daily.    loperamide (IMODIUM) 2 MG capsule Take 4 mg by mouth daily.     omeprazole (PRILOSEC) 20 MG capsule Take 20 mg by mouth 2 (two) times daily.     potassium chloride SA (K-DUR,KLOR-CON) 20 MEQ tablet Take 20 mEq by mouth daily.    prochlorperazine (COMPAZINE) 5 MG tablet Take 5 mg by mouth 3 (three) times daily as needed for nausea or vomiting.     roflumilast (DALIRESP) 500 MCG TABS tablet Take 500 mcg by mouth daily.    sertraline (ZOLOFT) 100 MG tablet Take 100 mg by mouth 2 (two) times daily.    tamsulosin (FLOMAX) 0.4 MG CAPS capsule Take 0.4 mg by mouth daily.    tiotropium (SPIRIVA) 18 MCG inhalation capsule Place 18 mcg into inhaler and inhale daily.    traMADol (ULTRAM) 50 MG tablet Take 50 mg by mouth 2 (two) times daily as needed for  moderate pain.     ziprasidone (GEODON) 60 MG capsule Take 60 mg by mouth at bedtime.      STOP taking these medications     predniSONE (DELTASONE) 10 MG tablet          DISCHARGE INSTRUCTIONS:   Activity as tolerated with LS  corcet Diet - low salt F/u with pcp, ortho, nephro, urology ,painmangement and psychiatry as recommended    DIET:  Low salt  DISCHARGE CONDITION:  fair  ACTIVITY:  As toelrated with LS corset and  OP PT  OXYGEN:  Home Oxygen: NO   Oxygen Delivery: NONE  DISCHARGE LOCATION:  Home   If you experience worsening  of your admission symptoms, develop shortness of breath, life threatening emergency, suicidal or homicidal thoughts you must seek medical attention immediately by calling 911 or calling your MD immediately  if symptoms less severe.  You Must read complete instructions/literature along with all the possible adverse reactions/side effects for all the Medicines you take and that have been prescribed to you. Take any new Medicines after you have completely understood and accpet all the possible adverse reactions/side effects.   Please note  You were cared for by a hospitalist during your hospital stay. If you have any questions about your discharge medications or the care you received while you were in the hospital after you are discharged, you can call the unit and asked to speak with the hospitalist on call if the hospitalist that took care of you is not available. Once you are discharged, your primary care physician will handle any further medical issues. Please note that NO REFILLS for any discharge medications will be authorized once you are discharged, as it is imperative that you return to your primary care physician (or establish a relationship with a primary care physician if you do not have one) for your aftercare needs so that they can reassess your need for medications and monitor your lab values.     Today  Chief Complaint  Patient  presents with  . Flank Pain  . Abdominal Pain  . Shortness of Breath   Pt is doing fine , back pain is  Chronic , better with pain meds  ROS:  CONSTITUTIONAL: Denies fevers, chills. Denies any fatigue, weakness.  EYES: Denies blurry vision, double vision, eye pain. EARS, NOSE, THROAT: Denies tinnitus, ear pain, hearing loss. RESPIRATORY: Denies cough, wheeze, shortness of breath.  CARDIOVASCULAR: Denies chest pain, palpitations, edema.  GASTROINTESTINAL: Denies nausea, vomiting, diarrhea, abdominal pain. Denies bright red blood per rectum. GENITOURINARY: Denies dysuria, hematuria. ENDOCRINE: Denies nocturia or thyroid problems. HEMATOLOGIC AND LYMPHATIC: Denies easy bruising or bleeding. SKIN: Denies rash or lesion. MUSCULOSKELETAL: Denies pain in neck, back, shoulder, knees, hips or arthritic symptoms.  NEUROLOGIC: Denies paralysis, paresthesias.  PSYCHIATRIC: Denies anxiety or depressive symptoms.   VITAL SIGNS:  Blood pressure 126/62, pulse 61, temperature 97.6 F (36.4 C), temperature source Oral, resp. rate 18, height  (1.651 m), weight 79.833 kg (176 lb), SpO2 98 %.  I/O:   Intake/Output Summary (Last 24 hours) at 04/11/15 1250 Last data filed at 04/11/15 1136  Gross per 24 hour  Intake    780 ml  Output    580 ml  Net    200 ml    PHYSICAL EXAMINATION:  GENERAL:  69 y.o.-year-old patient lying in the bed with no acute distress.  EYES: Pupils equal, round, reactive to light and accommodation. No scleral icterus. Extraocular muscles intact.  HEENT: Head atraumatic, normocephalic. Oropharynx and nasopharynx clear.  NECK:  Supple, no jugular venous distention. No thyroid enlargement, no tenderness.  LUNGS: Normal breath sounds bilaterally, no wheezing, rales,rhonchi or crepitation. No use of accessory muscles of respiration.  CARDIOVASCULAR: S1, S2 normal. No murmurs, rubs, or gallops.  ABDOMEN: Soft, non-tender, non-distended. Bowel sounds present. No organomegaly  or mass.  EXTREMITIES: No pedal edema, cyanosis, or clubbing.  NEUROLOGIC: Cranial nerves II through XII are intact. Muscle strength 5/5 in all extremities. Sensation intact. Gait not checked.  PSYCHIATRIC: The patient is alert and oriented x 3.  SKIN: No obvious rash, lesion, or ulcer.   DATA REVIEW:   CBC  Recent Labs Lab 04/11/15 [phone]  WBC 5.1  HGB 13.6  HCT 40.6  PLT 144*    Chemistries   Recent Labs Lab 04/11/15 0528  NA 139  K 3.8  CL 105  CO2 27  GLUCOSE 109*  BUN 15  CREATININE 1.28*  CALCIUM 9.2  AST 17  ALT 12*  ALKPHOS 22*  BILITOT 0.8    Cardiac Enzymes  Recent Labs Lab 04/09/15 1715  TROPONINI <0.03    Microbiology Results  Results for orders placed or performed during the hospital encounter of 04/09/15  Urine culture     Status: None (Preliminary result)   Collection Time: 04/10/15  4:43 PM  Result Value Ref Range Status   Specimen Description URINE, CLEAN CATCH  Final   Special Requests NONE  Final   Culture NO GROWTH < 24 HOURS  Final   Report Status PENDING  Incomplete    RADIOLOGY:  Dg Chest 1 View  04/09/2015  CLINICAL DATA:  Shortness breath and right-sided flank pain since last evening. EXAM: CHEST  1 VIEW COMPARISON:  Two-view chest x-ray 04/08/2015. FINDINGS: Low lung volumes exaggerate the heart size. Pacing and AICD wires are stable. Atherosclerotic calcifications are present at the aortic arch. Mild bibasilar airspace disease likely reflects atelectasis. No airspace consolidation is present otherwise. The visualized soft tissues and bony thorax are unremarkable. IMPRESSION: 1. Low lung volumes with mild bibasilar airspace disease likely reflects atelectasis. 2. Atherosclerosis. Electronically Signed   By: Marin Robertshristopher  Mattern M.D.   On: 04/09/2015 15:15   Dg Chest 2 View  04/08/2015  CLINICAL DATA:  Shortness of breath and cough for 1 week EXAM: CHEST - 2 VIEW COMPARISON:  12/25/2014 FINDINGS: Defibrillator is again seen and  stable. Cardiac shadow is within normal limits. Aortic calcifications are noted. The lungs are well aerated bilaterally without focal infiltrate or sizable effusion. No acute bony abnormality is noted. IMPRESSION: No active disease. Electronically Signed   By: Alcide CleverMark  Lukens M.D.   On: 04/08/2015 16:53   Dg Thoracic Spine 2 View  04/11/2015  CLINICAL DATA:  Dorsalgia EXAM: THORACIC SPINE 3 VIEWS COMPARISON:  Chest radiograph April 08, 2015 FINDINGS: Frontal, lateral, and swimmer's views were obtained. There is no fracture or spondylolisthesis. There is slight disc space narrowing at several levels. No erosive change. No paraspinous lesions are identified. IMPRESSION: Osteoarthritic changes several levels. No fracture or spondylolisthesis. Electronically Signed   By: Bretta BangWilliam  Woodruff III M.D.   On: 04/11/2015 08:14   Dg Lumbar Spine 2-3 Views  04/11/2015  CLINICAL DATA:  Thoracic and lumbar spine pain without known injury, some radicular component. EXAM: LUMBAR SPINE - 2-3 VIEW COMPARISON:  CT scan of the abdomen and pelvis dated April 09, 2015 FINDINGS: The lumbar vertebral bodies are preserved in height. The disc space heights are well maintained. There is no spondylolisthesis. There is mild facet joint hypertrophy at L4-5 and at L5-S1. The pedicles and transverse processes are intact. The observed portions of the sacrum are normal. There is dense calcification throughout the wall of the abdominal aorta. There are known calcifications in the splenic artery and pancreatic head. IMPRESSION: There are minimal osteoarthritic changes centered in the facet joints at L4-5 and L5-S1. Otherwise there is no significant degenerative change and there is no acute bony abnormality. Electronically Signed   By: David  SwazilandJordan M.D.   On: 04/11/2015 08:17   Ct Abdomen Pelvis W Contrast  04/09/2015  CLINICAL DATA:  Acute right flank and upper abdominal pain for 2 weeks. EXAM: CT ABDOMEN AND  PELVIS WITH CONTRAST TECHNIQUE:  Multidetector CT imaging of the abdomen and pelvis was performed using the standard protocol following bolus administration of intravenous contrast. CONTRAST:  OMNIPAQUE IOHEXOL 300 MG/ML  SOLN COMPARISON:  04/06/2012 FINDINGS: Lower chest: Minor bibasilar atelectasis versus scarring. No lower lobe pneumonia. Pacer wires in the right side of the heart. Normal heart size. No pericardial or pleural effusion. Lower thoracic atherosclerosis noted. Degenerative osteophytes of the lower thoracic spine on the right. Hepatobiliary: Diffuse hypoattenuation of the liver parenchyma compatible with hepatic steatosis. Liver measures 17.2 cm in craniocaudal dimension. No focal hepatic abnormality or biliary dilatation. Patent hepatic and portal veins. Collapsed gallbladder and biliary system unremarkable. No biliary dilatation. Pancreas: Calcifications of the pancreatic head from prior inflammation. Otherwise normal appearing pancreas for an 8. No ductal dilatation or acute inflammatory process. Spleen: No focal abnormality. Spleen is enlarged with a length of 16.9 cm. Splenic vein is patent. Adrenals/Urinary Tract: Normal adrenal glands for age. Kidneys demonstrate no acute obstruction, hydronephrosis, perinephric inflammatory process. Ureters are symmetric and decompressed. No obstructing ureteral calculus on either side. Renal atherosclerosis noted. Stomach/Bowel: Negative for obstruction, dilatation, or ileus. Normal appendix. No free air evident. Vascular/Lymphatic: Diffuse abdominal aortic and visceral atherosclerosis. Iliac atherosclerosis also noted. No aneurysm or occlusive process. No retroperitoneal hemorrhage or acute vascular finding. No adenopathy. Reproductive: Prostate calcifications noted. Other: Small fat containing left inguinal hernia. Intact abdominal wall. Musculoskeletal: Diffuse endplate degenerative changes of the spine and SI joints. IMPRESSION: Hepatosplenomegaly and associated hepatic steatosis.  Pancreatic head calcifications from remote pancreatitis. No acute inflammatory process. Abdominal atherosclerosis without aneurysm Normal appendix. No acute intra-abdominal pelvic process by CT. Electronically Signed   By: Judie Petit.  Shick M.D.   On: 04/09/2015 16:01    EKG:   Orders placed or performed during the hospital encounter of 04/08/15  . ED EKG  . ED EKG      Management plans discussed with the patient, family and they are in agreement.  CODE STATUS:     Code Status Orders        Start     Ordered   04/09/15 2231  Full code   Continuous     04/09/15 2230      TOTAL TIME TAKING CARE OF THIS PATIENT: 45 minutes.    @[facility] @  on 04/11/2015 at 12:50 PM  Between 7am to 6pm - Pager - [phone]  After 6pm go to [email or web address] - password [email or web address] [facility]  [facility]  Office  [phone]  CC: Primary care physician; Mickey Farber, MD

## 2015-04-11 NOTE — Evaluation (Signed)
Physical Therapy Evaluation Patient Details Name: Robert Ball R Pound MRN: [id number] DOB: 07-24-1945 Today's Date: 04/11/2015   History of Present Illness  Danie ChandlerBilly Theiss is a 69 y.o. male with a known history of end-stage COPD, hypertension, anxiety, posttraumatic stress disorder, GERD, hypertension, hyperlipidemia, who presents to the hospital complaining of abdominal pain, shortness of breath. Patient says that he's been having abdominal pain now for the past 2 weeks but has gotten progressively worse. He describes his abdominal pain sharp in nature located in the right side of his abdomen radiating sometimes to his back. He denies any nausea, vomiting. He denies any diarrhea, melena, hematochezia associated with the abdominal pain. He denies any fevers or chills. He also has been complaining of worsening shortness of breath associated with this abdominal pain. He presented to the emergency room and underwent a CT scan of his abdomen pelvis which was essentially benign. He was treated for his abdominal pain with IV Dilaudid multiple times but his pain still persists. Hospitalist services were contacted further treatment and evaluation.  Clinical Impression  Pt demonstrates good bed mobility, transfers, and ambulation. He is safe and steady reporting minimal increase in pain. However pt reports 8-9/10 baseline RUQ/LUQ pain radiating through the abdomen to the bilateral paraspinal area lateral to T10-T12. Pt reports tenderness with abdominal palpation as well as palpation over paraspinals. Decreased urine output over the last week so requested RN bladder scan patient and only 241mL found in bladder. Pt denies chills, fevers, nausea or vomiting but does confirm burning with urination. As long as all non-musculoskeletal etiologies of pain are ruled out pt would benefit from OP PT for pain control. This order would need to come from PCP who would follow-up with patient. Pt will benefit from skilled PT services to  address deficits in strength, balance, and mobility in order to return to full function at home.    Follow Up Recommendations Outpatient PT (If non-musculoskeletal etiologies ruled out, order by PCP)    Equipment Recommendations  None recommended by PT    Recommendations for Other Services       Precautions / Restrictions Precautions Precautions: Fall Restrictions Weight Bearing Restrictions: No      Mobility  Bed Mobility Overal bed mobility: Independent             General bed mobility comments: good strength and sequencing. Minimal reports of increased pain  Transfers Overall transfer level: Independent Equipment used: None             General transfer comment: good speed and sequencing. Good stability coming to standing. High resting abdominal and back pain but only mild increase in standing  Ambulation/Gait Ambulation/Gait assistance: Supervision Ambulation Distance (Feet): 100 Feet Assistive device: None Gait Pattern/deviations: WFL(Within Functional Limits)   Gait velocity interpretation: at or above normal speed for age/gender General Gait Details: Pt with functional gait speed and step length. No signficant report of increase in pain. Pt ambulates on room air and SaO2 drops to 93% but never lower. Pt becomes visibly SOB after ambulation so supplemental O2 applied for comfort but pt does not require based on vitals. No evidence for instability noted during ambulation. Pt has been ambulating independently in room since admission  Stairs            Wheelchair Mobility    Modified Rankin (Stroke Patients Only)       Balance Overall balance assessment: Needs assistance Sitting-balance support: No upper extremity supported Sitting balance-Leahy Scale: Good  Standing balance-Leahy Scale: Good Standing balance comment: Positive Rhomberg within 5-7 seconds. Single Leg Stance - Right Leg: 3 Single Leg Stance - Left Leg: 3                          Pertinent Vitals/Pain Pain Assessment: 0-10 Pain Score: 9  Pain Location: RUQ, LUQ radiating through abdomen to bilateral low back Pain Descriptors / Indicators: Sharp Pain Intervention(s): Monitored during session;Premedicated before session    Home Living Family/patient expects to be discharged to:: Private residence Living Arrangements: Spouse/significant other Available Help at Discharge: Family Type of Home: Mobile home Home Access: Stairs to enter Entrance Stairs-Rails: Doctor, general practice of Steps: 7 Home Layout: One level Home Equipment: Environmental consultant - 2 wheels;Cane - single point;Bedside commode;Hospital bed;Wheelchair - IT trainer;Shower seat      Prior Function Level of Independence: Independent         Comments: Drives, community ambulator. Prn use of supplemental O2 at 2L/min     Hand Dominance   Dominant Hand: Right    Extremity/Trunk Assessment   Upper Extremity Assessment: Overall WFL for tasks assessed           Lower Extremity Assessment: Overall WFL for tasks assessed (No focal weakness/numbness bilateral UE/LE)         Communication   Communication: No difficulties  Cognition Arousal/Alertness: Awake/alert Behavior During Therapy: WFL for tasks assessed/performed Overall Cognitive Status: Within Functional Limits for tasks assessed                      General Comments      Exercises        Assessment/Plan    PT Assessment Patient needs continued PT services  PT Diagnosis Acute pain   PT Problem List Pain  PT Treatment Interventions Manual techniques;Modalities;Therapeutic exercise   PT Goals (Current goals can be found in the Care Plan section) Acute Rehab PT Goals Patient Stated Goal: "I want the pain to go away." PT Goal Formulation: With patient/family Time For Goal Achievement: 04/25/15 Potential to Achieve Goals: Good    Frequency Min 2X/week   Barriers to discharge         Co-evaluation               End of Session Equipment Utilized During Treatment: Gait belt;Back brace (Issued lumbar corset after evaluation) Activity Tolerance: Patient tolerated treatment well Patient left: in bed;with call bell/phone within reach;with family/visitor present (Pt refuses bed alarm) Nurse Communication: Mobility status    Functional Assessment Tool Used: clinical judgement Functional Limitation: Mobility: Walking and moving around Mobility: Walking and Moving Around Current Status (O9629): At least 1 percent but less than 20 percent impaired, limited or restricted Mobility: Walking and Moving Around Goal Status [phone]): At least 1 percent but less than 20 percent impaired, limited or restricted    Time: 0940-1010 PT Time Calculation (min) (ACUTE ONLY): 30 min   Charges:   PT Evaluation $Initial PT Evaluation Tier I: 1 Procedure     PT G Codes:   PT G-Codes **NOT FOR INPATIENT CLASS** Functional Assessment Tool Used: clinical judgement Functional Limitation: Mobility: Walking and moving around Mobility: Walking and Moving Around Current Status (L2440): At least 1 percent but less than 20 percent impaired, limited or restricted Mobility: Walking and Moving Around Goal Status [phone]): At least 1 percent but less than 20 percent impaired, limited or restricted   Lynnea Maizes PT, DPT  Huprich,Jason 04/11/2015, 11:45 AM

## 2015-04-11 NOTE — Discharge Instructions (Signed)
Activity as tolerated with corcet Diet - low salt F/u with pcp, ortho, nephro, urology ,painmangement and psychiatry as recommended

## 2015-04-11 NOTE — Care Management (Signed)
Discussed case with Barbara CowerJason from PT and also Dr Amado CoeGouru. Patient ambulated well on RA with O2 saturation of 93% by PT.  Barbara CowerJason will bring to lumbar corsett to room prior to discharge. No other CM needs identified.

## 2015-04-12 LAB — URINE CULTURE: Culture: NO GROWTH

## 2015-04-16 DIAGNOSIS — M545 Low back pain, unspecified: Secondary | ICD-10-CM | POA: Insufficient documentation

## 2015-04-16 DIAGNOSIS — G8929 Other chronic pain: Secondary | ICD-10-CM | POA: Insufficient documentation

## 2015-04-30 ENCOUNTER — Ambulatory Visit: Payer: Medicare PPO | Admitting: Anesthesiology

## 2015-05-02 ENCOUNTER — Encounter: Payer: Self-pay | Admitting: Anesthesiology

## 2015-05-02 ENCOUNTER — Ambulatory Visit: Payer: Medicare PPO | Attending: Anesthesiology | Admitting: Anesthesiology

## 2015-05-02 VITALS — BP 160/82 | HR 78 | Temp 98.0°F | Resp 20 | Ht 65.0 in | Wt 180.0 lb

## 2015-05-02 DIAGNOSIS — F4310 Post-traumatic stress disorder, unspecified: Secondary | ICD-10-CM | POA: Diagnosis not present

## 2015-05-02 DIAGNOSIS — F419 Anxiety disorder, unspecified: Secondary | ICD-10-CM | POA: Insufficient documentation

## 2015-05-02 DIAGNOSIS — Z95810 Presence of automatic (implantable) cardiac defibrillator: Secondary | ICD-10-CM | POA: Diagnosis not present

## 2015-05-02 DIAGNOSIS — Z87891 Personal history of nicotine dependence: Secondary | ICD-10-CM | POA: Diagnosis not present

## 2015-05-02 DIAGNOSIS — I10 Essential (primary) hypertension: Secondary | ICD-10-CM | POA: Insufficient documentation

## 2015-05-02 DIAGNOSIS — J441 Chronic obstructive pulmonary disease with (acute) exacerbation: Secondary | ICD-10-CM | POA: Insufficient documentation

## 2015-05-02 DIAGNOSIS — M5386 Other specified dorsopathies, lumbar region: Secondary | ICD-10-CM

## 2015-05-02 DIAGNOSIS — M545 Low back pain: Secondary | ICD-10-CM

## 2015-05-02 DIAGNOSIS — M549 Dorsalgia, unspecified: Secondary | ICD-10-CM | POA: Diagnosis present

## 2015-05-02 DIAGNOSIS — R51 Headache: Secondary | ICD-10-CM | POA: Insufficient documentation

## 2015-05-02 DIAGNOSIS — K219 Gastro-esophageal reflux disease without esophagitis: Secondary | ICD-10-CM | POA: Diagnosis not present

## 2015-05-02 DIAGNOSIS — R0781 Pleurodynia: Secondary | ICD-10-CM | POA: Insufficient documentation

## 2015-05-02 DIAGNOSIS — M109 Gout, unspecified: Secondary | ICD-10-CM | POA: Insufficient documentation

## 2015-05-02 DIAGNOSIS — R109 Unspecified abdominal pain: Secondary | ICD-10-CM | POA: Diagnosis present

## 2015-05-02 DIAGNOSIS — M546 Pain in thoracic spine: Secondary | ICD-10-CM | POA: Diagnosis not present

## 2015-05-02 DIAGNOSIS — M5136 Other intervertebral disc degeneration, lumbar region: Secondary | ICD-10-CM | POA: Diagnosis not present

## 2015-05-02 DIAGNOSIS — F329 Major depressive disorder, single episode, unspecified: Secondary | ICD-10-CM | POA: Insufficient documentation

## 2015-05-02 DIAGNOSIS — N400 Enlarged prostate without lower urinary tract symptoms: Secondary | ICD-10-CM | POA: Insufficient documentation

## 2015-05-02 DIAGNOSIS — R1011 Right upper quadrant pain: Secondary | ICD-10-CM | POA: Diagnosis not present

## 2015-05-02 NOTE — Progress Notes (Signed)
Safety precautions to be maintained throughout the outpatient stay will include: orient to surroundings, keep bed in low position, maintain call bell within reach at all times, provide assistance with transfer out of bed and ambulation.  

## 2015-05-02 NOTE — Patient Instructions (Signed)
Trigger Point Injections Patient Information  Description: Trigger points are areas of muscle sensitive to touch which cause pain with movement, sometimes felt some distance from the site of palpation.  Usually the muscle containing these trigger points if felt as a tight band or knot.   The area of maximum tenderness or trigger point is identified, and after antiseptic preparation of the skin, a small needle is placed into this site.  Reproduction of the pain often occurs and numbing medicine (local anesthetic) is injected into the site, sometimes along with steroid preparation.  The entire block usually lasts less than 5 minutes.  Conditions which may be treated by trigger points:   Muscular pain and spasm  Nerve irritation  Preparation for the injection:  1. Do not eat any solid food or dairy products within 6 hours of your appointment. 2. You may drink clear liquids up to 2 hours before appointment.  Clear liquids include water, black coffee, juice or soda.  No milk or cream please. 3. You may take your regular medications, including pain medications, with a sip of water before your appointment.  Diabetics should hold regular insulin ( if take separately) and take 1/2 normal NPH dose the morning of the procedure.  Carry some sugar containing items with you to your appointment. 4. A driver must accompany you and be prepared to drive you home after your procedure.  5. Bring all your current medications with you. 6. An IV may be inserted and sedation may be given at the discretion of the physician.  7. A blood pressure cuff, EKG, and other monitors will often be applied during the procedure.  Some patients may need to have extra oxygen administered for a short period. 8. You will be asked to provide medical information, including your allergies and medications, prior to the procedure.  We must know immediately if you are taking blood thinners (like Coumadin/Warfarin) or if you are allergic to  IV iodine contrast (dye).  We must know if you could possibly be pregnant.  Possible side-effects:   Bleeding from needle site  Infection (rare, may require surgery)  Nerve injury (rare)  Numbness & tingling (temporary)  Punctured lung (if injection around chest)  Light-headedness (temporary)  Pain at injection site (several days)  Decreased blood pressure (rare, temporary)  Weakness in arm/leg (temporary)  Call if you experience:   Hive or difficulty breathing (go to the emergency room)  Inflammation or drainage at the injection site(s)  Please note:  Although the local anesthetic injected can often make your painful muscle feel good for several hours after the injection, the pain may return.  It takes 3-7 days for steroids to work.  You may not notice any pain relief for at least one week.  If effective, we will often do a series of injections spaced 3-6 weeks apart to maximally decrease your pain.  If you have any questions please call [phone] [facility] 

## 2015-05-03 NOTE — Progress Notes (Signed)
Subjective:  Patient ID: Robert Ball, male    DOB: 12-29-1945  Age: 69 y.o. MRN: [id number]  CC: Back Pain and Abdominal Pain   HPI [name] presents for right side upper abdominal pain and posterior back pain. He is followed by Dr. Harrington Challenger and has referred for evaluation management. He describes an area over the right anterior abdominal region that has caused him considerable deep-seated pain for the past 2-3 months. He's gone through an extensive workup for this including abdominal CT scan chest x-rays in an effort to find an underlying source. These efforts and then unsuccessful and he currently presents with a diagnosis of right side myofascial pain. He also describes an area over lying the lower border of his scapula on the right side that also is associated with considerable pain. Occasionally this pain will radiate from the shoulder to the anterior abdominal wall. There are no other associated symptoms related to this.  He is a very complicated history of severe COPD and he is on oxygen at present and uses this at night as well. He takes continuous steroids for his COPD and uses oxycodone for chronic pain. He also takes Flexeril and lorazepam.  History Erlin has a past medical history of PTSD (post-traumatic stress disorder); Hypertension; Anxiety; Depression; Prostate enlargement; GERD (gastroesophageal reflux disease); Headache; Elevated lipids; Shakes; COPD (chronic obstructive pulmonary disease) (HCC); and Cardiac defibrillator in place.   He has past surgical history that includes Neck surgery; Hernia repair; Hemorrhoid surgery; Cataract extraction w/ intraocular lens  implant, bilateral (Bilateral); Eye muscle surgery (Bilateral); and Icd lead removal (Left, 12/27/2014).   His family history includes Heart disease in his father and mother; Lymphoma in his father.He reports that he quit smoking about 7 years ago. His smoking use included Cigarettes. He has a 200 pack-year  smoking history. He does not have any smokeless tobacco history on file. He reports that he does not drink alcohol or use illicit drugs.   ---------------------------------------------------------------------------------------------------------------------- Past Medical History  Diagnosis Date  . PTSD (post-traumatic stress disorder)   . Hypertension   . Anxiety   . Depression   . Prostate enlargement   . GERD (gastroesophageal reflux disease)   . Headache   . Elevated lipids   . Shakes   . COPD (chronic obstructive pulmonary disease) (HCC)     On 2liter oxygen  . Cardiac defibrillator in place     Past Surgical History  Procedure Laterality Date  . Neck surgery    . Hernia repair    . Hemorrhoid surgery    . Cataract extraction w/ intraocular lens  implant, bilateral Bilateral   . Eye muscle surgery Bilateral   . Icd lead removal Left 12/27/2014    Procedure: ICD LEAD REMOVAL/ICD GENERATOR CHANGE OUT;  Surgeon: Marcina Millard, MD;  Location: [facility] ORS;  Service: Cardiovascular;  Laterality: Left;    Family History  Problem Relation Age of Onset  . Heart disease Father   . Heart disease Mother   . Lymphoma Father     Social History  Substance Use Topics  . Smoking status: Former Smoker -- 5.00 packs/day for 40 years    Types: Cigarettes    Quit date: 09/24/2007  . Smokeless tobacco: Not on file  . Alcohol Use: No     Comment: occ    ---------------------------------------------------------------------------------------------------------------------- Social History   Social History  . Marital Status: Married    Spouse Name: N/A  . Number of Children: N/A  . Years  of Education: N/A   Social History Main Topics  . Smoking status: Former Smoker -- 5.00 packs/day for 40 years    Types: Cigarettes    Quit date: 09/24/2007  . Smokeless tobacco: None  . Alcohol Use: No     Comment: occ  . Drug Use: No  . Sexual Activity: Not Asked   Other Topics Concern   . None   Social History Narrative      ----------------------------------------------------------------------------------------------------------------------  ROS Review of Systems   Objective:  BP 160/82 mmHg  Pulse 78  Temp(Src) 98 F (36.7 C) (Oral)  Resp 20  Ht  (1.651 m)  Wt 180 lb (81.647 kg)  BMI 29.95 kg/m2  SpO2 98%  Physical Exam He is alert and oriented 3 cooperative compliant. His lungs show distant breath sounds. He is barrel chested using oxygen at present. I hear no wheezing or rales. His heart is regular rhythm without murmur. Inspection of the anterior abdominal wall with the patient in the supine position reveals some deep-seated tenderness with palpation in approximately 2-3 cm below the T8-T9 costal rib margins. This is in the right upper quadrant. His belly is distended and obese but no guarding or tenderness otherwise noted. Inspection of the posterior scapular region reveals some tenderness and a trigger point overlying the mid body of the right scapula. This does radiate some pain into the anterior abdominal wall where his other pain is noted. Lower extremity strength and function are at baseline and he has a mildly antalgic gait. He does have a positive straight leg raise in the supine position.    Assessment & Plan:   [name] was seen today for back pain and abdominal pain.  Diagnoses and all orders for this visit:  Rib pain on right side  Right-sided thoracic back pain  DDD (degenerative disc disease), lumbar  COPD exacerbation (HCC)  Abdominal pain, right upper quadrant     ----------------------------------------------------------------------------------------------------------------------  Problem List Items Addressed This Visit    None    Visit Diagnoses    Rib pain on right side    -  Primary    Right-sided thoracic back pain        Relevant Medications    predniSONE (DELTASONE) 10 MG tablet    DDD (degenerative disc  disease), lumbar        Relevant Medications    predniSONE (DELTASONE) 10 MG tablet    COPD exacerbation (HCC)        Relevant Medications    predniSONE (DELTASONE) 10 MG tablet    Abdominal pain, right upper quadrant           ----------------------------------------------------------------------------------------------------------------------  1. Rib pain on right side   2. Right-sided thoracic back pain   3. DDD (degenerative disc disease), lumbar   4. COPD exacerbation (HCC)   5. Abdominal pain, right upper quadrant     ----------------------------------------------------------------------------------------------------------------------  I am having Mr. Corine Shelter maintain his atorvastatin, omeprazole, ziprasidone, amLODipine, furosemide, tamsulosin, carvedilol, sertraline, potassium chloride SA, roflumilast, budesonide-formoterol, tiotropium, albuterol, albuterol, prochlorperazine, dimenhyDRINATE, traMADol, loperamide, fenofibrate, acetaminophen, Vitamin D3, cyclobenzaprine, LORazepam, predniSONE, oxyCODONE-acetaminophen, and predniSONE.   Meds ordered this encounter  Medications  . predniSONE (DELTASONE) 10 MG tablet    Sig: Take 10 mg by mouth daily with breakfast.       Follow-up: Return in about 2 weeks (around 05/16/2015) for procedure. we'll plan on proceeding with a myoneural block to the right posterior scapular region. Frontal the risks and benefits of this with him in  full detail to see if we can alleviate some of his right posterior shoulder pain. Furthermore this will help us further discern whether this is related to the anterior abdominal wall pain. Also gone over options regarding a possible intercostal nerve block as an option however I like to proceed with the myoneural block first secondary to concerns regarding his COPD. We'll keep him on his current baseline medications at present.   Yevette EdwardsJames G Harrietta Incorvaia, MD

## 2015-06-13 ENCOUNTER — Emergency Department
Admission: EM | Admit: 2015-06-13 | Discharge: 2015-06-13 | Disposition: A | Discharge status: Home / Self Care | Payer: Medicare PPO | Attending: Emergency Medicine | Admitting: Emergency Medicine

## 2015-06-13 ENCOUNTER — Emergency Department: Payer: Medicare PPO

## 2015-06-13 ENCOUNTER — Encounter: Payer: Self-pay | Admitting: Radiology

## 2015-06-13 DIAGNOSIS — Z87891 Personal history of nicotine dependence: Secondary | ICD-10-CM | POA: Insufficient documentation

## 2015-06-13 DIAGNOSIS — R079 Chest pain, unspecified: Secondary | ICD-10-CM

## 2015-06-13 DIAGNOSIS — I10 Essential (primary) hypertension: Secondary | ICD-10-CM | POA: Insufficient documentation

## 2015-06-13 DIAGNOSIS — R109 Unspecified abdominal pain: Secondary | ICD-10-CM | POA: Insufficient documentation

## 2015-06-13 DIAGNOSIS — R0602 Shortness of breath: Secondary | ICD-10-CM | POA: Diagnosis present

## 2015-06-13 DIAGNOSIS — Z7951 Long term (current) use of inhaled steroids: Secondary | ICD-10-CM | POA: Insufficient documentation

## 2015-06-13 DIAGNOSIS — Z7952 Long term (current) use of systemic steroids: Secondary | ICD-10-CM | POA: Insufficient documentation

## 2015-06-13 DIAGNOSIS — F419 Anxiety disorder, unspecified: Secondary | ICD-10-CM | POA: Diagnosis not present

## 2015-06-13 DIAGNOSIS — J441 Chronic obstructive pulmonary disease with (acute) exacerbation: Secondary | ICD-10-CM | POA: Insufficient documentation

## 2015-06-13 DIAGNOSIS — R0789 Other chest pain: Secondary | ICD-10-CM | POA: Diagnosis not present

## 2015-06-13 DIAGNOSIS — Z79899 Other long term (current) drug therapy: Secondary | ICD-10-CM | POA: Insufficient documentation

## 2015-06-13 LAB — URINALYSIS COMPLETE WITH MICROSCOPIC (ARMC ONLY)
BACTERIA UA: NONE SEEN
Bilirubin Urine: NEGATIVE
GLUCOSE, UA: NEGATIVE mg/dL
HGB URINE DIPSTICK: NEGATIVE
Ketones, ur: NEGATIVE mg/dL
LEUKOCYTES UA: NEGATIVE
NITRITE: NEGATIVE
PH: 6 (ref 5.0–8.0)
PROTEIN: NEGATIVE mg/dL
SPECIFIC GRAVITY, URINE: 1.002 — AB (ref 1.005–1.030)

## 2015-06-13 LAB — COMPREHENSIVE METABOLIC PANEL
ALBUMIN: 4.2 g/dL (ref 3.5–5.0)
ALT: 21 U/L (ref 17–63)
ANION GAP: 10 (ref 5–15)
AST: 31 U/L (ref 15–41)
Alkaline Phosphatase: 33 U/L — ABNORMAL LOW (ref 38–126)
BUN: 10 mg/dL (ref 6–20)
CHLORIDE: 96 mmol/L — AB (ref 101–111)
CO2: 24 mmol/L (ref 22–32)
Calcium: 9.4 mg/dL (ref 8.9–10.3)
Creatinine, Ser: 1.26 mg/dL — ABNORMAL HIGH (ref 0.61–1.24)
GFR calc Af Amer: >60 mL/min (ref >60)
GFR calc non Af Amer: 56 mL/min — ABNORMAL LOW (ref >60)
GLUCOSE: 94 mg/dL (ref 65–99)
POTASSIUM: 4 mmol/L (ref 3.5–5.1)
Sodium: 130 mmol/L — ABNORMAL LOW (ref 135–145)
Total Bilirubin: 1 mg/dL (ref 0.3–1.2)
Total Protein: 7.4 g/dL (ref 6.5–8.1)

## 2015-06-13 LAB — CBC WITH DIFFERENTIAL/PLATELET
Basophils Absolute: 0.1 10*3/uL (ref 0–0.1)
Basophils Relative: 1 %
EOS PCT: 0 %
Eosinophils Absolute: 0 10*3/uL (ref 0–0.7)
HEMATOCRIT: 43.1 % (ref 40.0–52.0)
Hemoglobin: 15.1 g/dL (ref 13.0–18.0)
LYMPHS ABS: 1.2 10*3/uL (ref 1.0–3.6)
LYMPHS PCT: 13 %
MCH: 31.6 pg (ref 26.0–34.0)
MCHC: 35.1 g/dL (ref 32.0–36.0)
MCV: 89.9 fL (ref 80.0–100.0)
Monocytes Absolute: 0.4 10*3/uL (ref 0.2–1.0)
Monocytes Relative: 4 %
NEUTROS ABS: 8.1 10*3/uL — AB (ref 1.4–6.5)
Neutrophils Relative %: 82 %
PLATELETS: 181 10*3/uL (ref 150–440)
RBC: 4.79 MIL/uL (ref 4.40–5.90)
RDW: 14.4 % (ref 11.5–14.5)
WBC: 9.8 10*3/uL (ref 3.8–10.6)

## 2015-06-13 LAB — FIBRIN DERIVATIVES D-DIMER (ARMC ONLY): FIBRIN DERIVATIVES D-DIMER (ARMC): 208 (ref 0–499)

## 2015-06-13 LAB — LIPASE, BLOOD: LIPASE: 40 U/L (ref 11–51)

## 2015-06-13 LAB — TROPONIN I: Troponin I: 0.04 ng/mL — ABNORMAL HIGH (ref <0.031)

## 2015-06-13 MED ORDER — SODIUM CHLORIDE 0.9 % IV BOLUS (SEPSIS)
500.0000 mL | Freq: Once | INTRAVENOUS | Status: AC
  Administered 2015-06-13: 500 mL via INTRAVENOUS

## 2015-06-13 MED ORDER — LORAZEPAM 2 MG/ML IJ SOLN
1.0000 mg | Freq: Once | INTRAMUSCULAR | Status: AC
  Administered 2015-06-13: 1 mg via INTRAVENOUS
  Filled 2015-06-13: qty 1

## 2015-06-13 MED ORDER — TRAMADOL HCL 50 MG PO TABS: 50.0000 mg | ORAL_TABLET | Freq: Four times a day (QID) | ORAL | Status: DC | PRN

## 2015-06-13 MED ORDER — ONDANSETRON HCL 4 MG/2ML IJ SOLN
4.0000 mg | Freq: Once | INTRAMUSCULAR | Status: AC
  Administered 2015-06-13: 4 mg via INTRAVENOUS
  Filled 2015-06-13: qty 2

## 2015-06-13 MED ORDER — CEFUROXIME AXETIL 500 MG PO TABS: 500.0000 mg | ORAL_TABLET | Freq: Two times a day (BID) | ORAL | Status: AC

## 2015-06-13 MED ORDER — HYDROMORPHONE HCL 1 MG/ML IJ SOLN
1.0000 mg | Freq: Once | INTRAMUSCULAR | Status: AC
  Administered 2015-06-13: 1 mg via INTRAVENOUS
  Filled 2015-06-13: qty 1

## 2015-06-13 MED ORDER — EPINEPHRINE HCL 1 MG/ML IJ SOLN: 1.0000 mg | Freq: Once | INTRAMUSCULAR | Status: DC

## 2015-06-13 MED ORDER — CEFUROXIME AXETIL 500 MG PO TABS
500.0000 mg | ORAL_TABLET | Freq: Two times a day (BID) | ORAL | Status: DC
  Administered 2015-06-13: 500 mg via ORAL
  Filled 2015-06-13: qty 1

## 2015-06-13 MED ORDER — HYDROMORPHONE HCL 2 MG PO TABS
2.0000 mg | ORAL_TABLET | Freq: Once | ORAL | Status: AC
  Administered 2015-06-13: 2 mg via ORAL
  Filled 2015-06-13: qty 1

## 2015-06-13 MED ORDER — PREDNISONE 20 MG PO TABS: 40.0000 mg | ORAL_TABLET | Freq: Every day | ORAL | Status: DC

## 2015-06-13 MED ORDER — IOHEXOL 350 MG/ML SOLN
100.0000 mL | Freq: Once | INTRAVENOUS | Status: AC | PRN
  Administered 2015-06-13: 100 mL via INTRAVENOUS

## 2015-06-13 NOTE — ED Notes (Signed)
Pharmacy called and notified of need of ceftin. States they will send it up. Patient made aware.

## 2015-06-13 NOTE — Discharge Instructions (Signed)
Chest Wall Pain °Chest wall pain is pain in or around the bones and muscles of your chest. Sometimes, an injury causes this pain. Sometimes, the cause may not be known. This pain may take several weeks or longer to get better. °HOME CARE INSTRUCTIONS  °Pay attention to any changes in your symptoms. Take these actions to help with your pain:  °· Rest as told by your health care provider.   °· Avoid activities that cause pain. These include any activities that use your chest muscles or your abdominal and side muscles to lift heavy items.    °· If directed, apply ice to the painful area: °¨ Put ice in a plastic bag. °¨ Place a towel between your skin and the bag. °¨ Leave the ice on for 20 minutes, 2-3 times per day. °· Take over-the-counter and prescription medicines only as told by your health care provider. °· Do not use tobacco products, including cigarettes, chewing tobacco, and e-cigarettes. If you need help quitting, ask your health care provider. °· Keep all follow-up visits as told by your health care provider. This is important. °SEEK MEDICAL CARE IF: °· You have a fever. °· Your chest pain becomes worse. °· You have new symptoms. °SEEK IMMEDIATE MEDICAL CARE IF: °· You have nausea or vomiting. °· You feel sweaty or light-headed. °· You have a cough with phlegm (sputum) or you cough up blood. °· You develop shortness of breath. °  °This information is not intended to replace advice given to you by your health care provider. Make sure you discuss any questions you have with your health care provider. °  °Document Released: 04/20/2005 Document Revised: 01/09/2015 Document Reviewed: 07/16/2014 °Elsevier Interactive Patient Education ©2016 [employer] ° °Please return immediately if condition worsens. Please contact her primary physician or the physician you were given for referral. If you have any specialist physicians involved in her treatment and plan please also contact them. Thank you for using [place]  [facility]. ° °

## 2015-06-13 NOTE — ED Notes (Signed)
Per EMS patient comes from home with c/o right flank pain. Patient states this has been going on for a couple days but today it got worse and made him feel SOB. Patient does have a hx of CHF and COPD. Patient wears 2L of O2 at home continuously. 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 Rates pain 9/10.

## 2015-06-13 NOTE — ED Provider Notes (Signed)
Time Seen: Approximately *1500 I have reviewed the triage notes  Chief Complaint: Flank Pain   History of Present Illness: Robert Ball is a 70 y.o. male who presents with a 2 day history of right-sided abdominal and flank pain. Patient states he had a similar episode of pain in December and records show he had a extensive evaluation including a contrasted abdominal CT which did not show any abnormalities. He states the pain is worse with movement and he states it causes him to feel short of breath. He's had a dry occasionally productive cough with yellow tinged sputum. Patient has a history of COPD and states he's had some increased shortness of breath. He denies any fever at home. He denies any calf tenderness or swelling. Pain in his right sides worse with movement he denies any trauma.   Past Medical History  Diagnosis Date  . PTSD (post-traumatic stress disorder)   . Hypertension   . Anxiety   . Depression   . Prostate enlargement   . GERD (gastroesophageal reflux disease)   . Headache   . Elevated lipids   . Shakes   . COPD (chronic obstructive pulmonary disease) (HCC)     On 2liter oxygen  . Cardiac defibrillator in place     Patient Active Problem List   Diagnosis Date Noted  . Abdominal pain 04/09/2015    Past Surgical History  Procedure Laterality Date  . Neck surgery    . Hernia repair    . Hemorrhoid surgery    . Cataract extraction w/ intraocular lens  implant, bilateral Bilateral   . Eye muscle surgery Bilateral   . Icd lead removal Left 12/27/2014    Procedure: ICD LEAD REMOVAL/ICD GENERATOR CHANGE OUT;  Surgeon: Marcina Millard, MD;  Location: [facility] ORS;  Service: Cardiovascular;  Laterality: Left;    Past Surgical History  Procedure Laterality Date  . Neck surgery    . Hernia repair    . Hemorrhoid surgery    . Cataract extraction w/ intraocular lens  implant, bilateral Bilateral   . Eye muscle surgery Bilateral   . Icd lead removal Left  12/27/2014    Procedure: ICD LEAD REMOVAL/ICD GENERATOR CHANGE OUT;  Surgeon: Marcina Millard, MD;  Location: [facility] ORS;  Service: Cardiovascular;  Laterality: Left;    Current Outpatient Rx  Name  Route  Sig  Dispense  Refill  . acetaminophen (TYLENOL) 500 MG tablet   Oral   Take 500 mg by mouth every 6 (six) hours as needed for mild pain or headache.         . albuterol (PROVENTIL HFA;VENTOLIN HFA) 108 (90 BASE) MCG/ACT inhaler   Inhalation   Inhale 2 puffs into the lungs every 6 (six) hours as needed for wheezing or shortness of breath.         Marland Kitchen albuterol (PROVENTIL) (2.5 MG/3ML) 0.083% nebulizer solution   Nebulization   Take 2.5 mg by nebulization every 6 (six) hours as needed for wheezing or shortness of breath.         Marland Kitchen amLODipine (NORVASC) 10 MG tablet   Oral   Take 10 mg by mouth daily.         Marland Kitchen atorvastatin (LIPITOR) 40 MG tablet   Oral   Take 40 mg by mouth daily. Reported on 05/02/2015         . budesonide-formoterol (SYMBICORT) 160-4.5 MCG/ACT inhaler   Inhalation   Inhale 2 puffs into the lungs 2 (two) times daily.         Marland Kitchen  carvedilol (COREG) 12.5 MG tablet   Oral   Take 12.5 mg by mouth 2 (two) times daily.          . cefUROXime (CEFTIN) 500 MG tablet   Oral   Take 1 tablet (500 mg total) by mouth 2 (two) times daily.   20 tablet   0   . Cholecalciferol (VITAMIN D3) 5000 UNITS CAPS   Oral   Take 5,000 Units by mouth every other day. Reported on 05/02/2015         . cyclobenzaprine (FLEXERIL) 5 MG tablet   Oral   Take 1 tablet (5 mg total) by mouth 3 (three) times daily as needed for muscle spasms. Patient not taking: Reported on 05/02/2015   30 tablet   0   . dimenhyDRINATE (DRAMAMINE) 50 MG tablet   Oral   Take 50 mg by mouth every 8 (eight) hours as needed for nausea.         . fenofibrate 160 MG tablet   Oral   Take 160 mg by mouth daily.         . furosemide (LASIX) 40 MG tablet   Oral   Take 40 mg by mouth  daily.         Marland Kitchen loperamide (IMODIUM) 2 MG capsule   Oral   Take 4 mg by mouth daily.          Marland Kitchen LORazepam (ATIVAN) 1 MG tablet   Oral   Take 1 tablet (1 mg total) by mouth every 8 (eight) hours as needed for anxiety. Patient not taking: Reported on 05/02/2015   30 tablet   0   . omeprazole (PRILOSEC) 20 MG capsule   Oral   Take 20 mg by mouth 2 (two) times daily.          Marland Kitchen oxyCODONE-acetaminophen (ROXICET) 5-325 MG tablet   Oral   Take 1-2 tablets by mouth every 6 (six) hours as needed for moderate pain or severe pain. Patient not taking: Reported on 05/02/2015   30 tablet   0   . potassium chloride SA (K-DUR,KLOR-CON) 20 MEQ tablet   Oral   Take 20 mEq by mouth daily.         . predniSONE (DELTASONE) 20 MG tablet   Oral   Take 2 tablets (40 mg total) by mouth daily.   10 tablet   0   . prochlorperazine (COMPAZINE) 5 MG tablet   Oral   Take 5 mg by mouth 3 (three) times daily as needed for nausea or vomiting.          . roflumilast (DALIRESP) 500 MCG TABS tablet   Oral   Take 500 mcg by mouth daily.         . sertraline (ZOLOFT) 100 MG tablet   Oral   Take 100 mg by mouth 2 (two) times daily.         . tamsulosin (FLOMAX) 0.4 MG CAPS capsule   Oral   Take 0.4 mg by mouth daily.         Marland Kitchen tiotropium (SPIRIVA) 18 MCG inhalation capsule   Inhalation   Place 18 mcg into inhaler and inhale daily.         . traMADol (ULTRAM) 50 MG tablet   Oral   Take 1 tablet (50 mg total) by mouth every 6 (six) hours as needed.   20 tablet   0   . ziprasidone (GEODON) 60 MG capsule   Oral  Take 60 mg by mouth at bedtime.           Allergies:  Aspirin; Ciprofloxacin hcl; Ibuprofen; Metronidazole; Naproxen; and Tetracyclines & related  Family History: Family History  Problem Relation Age of Onset  . Heart disease Father   . Heart disease Mother   . Lymphoma Father     Social History: Social History  Substance Use Topics  . Smoking  status: Former Smoker -- 5.00 packs/day for 40 years    Types: Cigarettes    Quit date: 09/24/2007  . Smokeless tobacco: None  . Alcohol Use: No     Comment: occ     Review of Systems:   10 point review of systems was performed and was otherwise negative:  Constitutional: No fever Eyes: No visual disturbances ENT: No sore throat, ear pain Cardiac: No chest pain Respiratory: No shortness of breath, wheezing, or stridor Abdomen: No abdominal pain, no vomiting, No diarrhea Endocrine: No weight loss, No night sweats Extremities: No peripheral edema, cyanosis Skin: No rashes, easy bruising Neurologic: No focal weakness, trouble with speech or swollowing Urologic: No dysuria, Hematuria, or urinary frequency   Physical Exam:  ED Triage Vitals  Enc Vitals Group     BP 06/13/15 1717 111/71 mmHg     Pulse Rate 06/13/15 1502 76     Resp 06/13/15 1502 25     Temp 06/13/15 1502 97.9 F (36.6 C)     Temp Source 06/13/15 1502 Oral     SpO2 06/13/15 1502 98 %     Weight 06/13/15 1502 170 lb (77.111 kg)     Height 06/13/15 1502  (1.651 m)     Head Cir --      Peak Flow --      Pain Score 06/13/15 1510 9     Pain Loc --      Pain Edu? --      Excl. in GC? --     General: Awake , Alert , and Oriented times 3; GCS 15 anxious no signs of respiratory distress Head: Normal cephalic , atraumatic Eyes: Pupils equal , round, reactive to light Nose/Throat: No nasal drainage, patent upper airway without erythema or exudate.  Neck: Supple, Full range of motion, No anterior adenopathy or palpable thyroid masses Lungs: Diminished at the bases with some mild rhonchi at the right base with no associated rales or wheezing Heart: Regular rate, regular rhythm without murmurs , gallops , or rubs Abdomen: Soft, non tender without rebound, guarding , or rigidity; bowel sounds positive and symmetric in all 4 quadrants. No organomegaly .        Extremities: 2 plus symmetric pulses. No edema,  clubbing or cyanosis Neurologic: normal ambulation, Motor symmetric without deficits, sensory intact Skin: warm, dry, no rashes   Labs:   All laboratory work was reviewed including any pertinent negatives or positives listed below:  Labs Reviewed  COMPREHENSIVE METABOLIC PANEL - Abnormal; Notable for the following:    Sodium 130 (*)    Chloride 96 (*)    Creatinine, Ser 1.26 (*)    Alkaline Phosphatase 33 (*)    GFR calc non Af Amer 56 (*)    All other components within normal limits  CBC WITH DIFFERENTIAL/PLATELET - Abnormal; Notable for the following:    Neutro Abs 8.1 (*)    All other components within normal limits  TROPONIN I - Abnormal; Notable for the following:    Troponin I 0.04 (*)    All other  components within normal limits  URINALYSIS COMPLETEWITH MICROSCOPIC ([facility] ONLY) - Abnormal; Notable for the following:    Color, Urine STRAW (*)    APPearance CLEAR (*)    Specific Gravity, Urine 1.002 (*)    Squamous Epithelial / LPF 0-5 (*)    All other components within normal limits  LIPASE, BLOOD  FIBRIN DERIVATIVES D-DIMER ([facility] ONLY)   review laboratory work showed no significant findings  EKG: * ED ECG REPORT I, Jennye Moccasin, the attending physician, personally viewed and interpreted this ECG.  Date: 06/13/2015 EKG Time: 1506 Rate: *79 Rhythm: normal sinus rhythm QRS Axis: Left axis deviation Intervals: normal ST/T Wave abnormalities: normal Conduction Disturbances: none Narrative Interpretation: unremarkable Poor R-wave progression in the anterior leads with no acute ischemic changes   Radiology:    EXAM: CT ANGIOGRAPHY CHEST WITH CONTRAST  TECHNIQUE: Multidetector CT imaging of the chest was performed using the standard protocol during bolus administration of intravenous contrast. Multiplanar CT image reconstructions and MIPs were obtained to evaluate the vascular anatomy.  CONTRAST: OMNIPAQUE IOHEXOL 350 MG/ML SOLN  COMPARISON:  Current chest radiograph  FINDINGS: Angiographic study: No evidence of a pulmonary embolus. Great vessels normal in caliber. There is atherosclerotic plaque along the thoracic aorta. No dissection.  Neck base and axilla: No mass or adenopathy. Thyroid unremarkable.  Mediastinum and hila: Heart top-normal in size. Trace pericardial fluid. There are mild coronary artery calcifications. Sequential pacemaker leads are well positioned. No mediastinal or hilar masses or pathologically enlarged lymph nodes.  Lungs and pleura: Mild dependent subsegmental atelectasis. No evidence of pulmonary edema. No pneumonia. Small calcified granulomas in the right upper lobe and left lower lobe. No mass or suspicious nodule. No pleural effusion or pneumothorax.  Limited upper abdomen: Extensive hepatic steatosis. Spleen incompletely imaged, but appears mildly enlarged measuring 15.4 cm in greatest transverse dimension.  Musculoskeletal: Mild degenerative changes along the thoracic spine. No osteoblastic or osteolytic lesions.  Review of the MIP images confirms the above findings.  IMPRESSION: 1. No evidence of a pulmonary embolus. No aortic dissection. 2. No acute findings in the chest. 3. Extensive hepatic steatosis. Mild splenomegaly. These findings are stable when compared to an abdomen and pelvis CT dated 04/09/2015.     I personally reviewed the radiologic studies    ED Course:  Patient here is stable on his normal 2-3 L nasal cannula at home and there was no episodes of hypoxemia. Patient was given IV Dilaudid and Zofran with significant improvement of his right-sided pain. Patient had a DuoNeb treatment here and some IV Solu-Medrol. Patient will be discharged with after a dose of Ceftin and Dilaudid tablet for pain. His pain is very reproducible and seems to be a repeat of a musculoskeletal pain he had back in December. I felt was unlikely to be a pulmonary embolism, pulmonary edema,  obvious community-acquired pneumonia etc. He most likely is having exacerbation of his COPD and will be discharged on a course of prednisone and has duo nebs at home.    Assessment: * Acute exacerbation of COPD Right-sided chest wall pain   Final Clinical Impression: *  Final diagnoses:  Right-sided chest pain     Plan:  Outpatient management Patient was advised to return immediately if condition worsens. Patient was advised to follow up with their primary care physician or other specialized physicians involved in their outpatient care            Jennye Moccasin, MD 06/13/15 1906

## 2015-06-18 ENCOUNTER — Encounter: Payer: Self-pay | Admitting: Anesthesiology

## 2015-06-18 ENCOUNTER — Ambulatory Visit: Payer: Medicare PPO | Attending: Anesthesiology | Admitting: Anesthesiology

## 2015-06-18 VITALS — BP 162/79 | HR 74 | Temp 97.3°F | Resp 16 | Ht 65.0 in | Wt 180.0 lb

## 2015-06-18 DIAGNOSIS — F329 Major depressive disorder, single episode, unspecified: Secondary | ICD-10-CM | POA: Insufficient documentation

## 2015-06-18 DIAGNOSIS — K219 Gastro-esophageal reflux disease without esophagitis: Secondary | ICD-10-CM | POA: Insufficient documentation

## 2015-06-18 DIAGNOSIS — R51 Headache: Secondary | ICD-10-CM | POA: Insufficient documentation

## 2015-06-18 DIAGNOSIS — F419 Anxiety disorder, unspecified: Secondary | ICD-10-CM | POA: Diagnosis not present

## 2015-06-18 DIAGNOSIS — M549 Dorsalgia, unspecified: Secondary | ICD-10-CM | POA: Diagnosis present

## 2015-06-18 DIAGNOSIS — M546 Pain in thoracic spine: Secondary | ICD-10-CM | POA: Insufficient documentation

## 2015-06-18 DIAGNOSIS — I10 Essential (primary) hypertension: Secondary | ICD-10-CM | POA: Diagnosis not present

## 2015-06-18 DIAGNOSIS — R1011 Right upper quadrant pain: Secondary | ICD-10-CM | POA: Insufficient documentation

## 2015-06-18 DIAGNOSIS — M5136 Other intervertebral disc degeneration, lumbar region: Secondary | ICD-10-CM | POA: Diagnosis not present

## 2015-06-18 DIAGNOSIS — Z95810 Presence of automatic (implantable) cardiac defibrillator: Secondary | ICD-10-CM | POA: Diagnosis not present

## 2015-06-18 DIAGNOSIS — M545 Low back pain: Secondary | ICD-10-CM

## 2015-06-18 DIAGNOSIS — N400 Enlarged prostate without lower urinary tract symptoms: Secondary | ICD-10-CM | POA: Diagnosis not present

## 2015-06-18 DIAGNOSIS — Z87891 Personal history of nicotine dependence: Secondary | ICD-10-CM | POA: Diagnosis not present

## 2015-06-18 DIAGNOSIS — F4310 Post-traumatic stress disorder, unspecified: Secondary | ICD-10-CM | POA: Insufficient documentation

## 2015-06-18 DIAGNOSIS — M5386 Other specified dorsopathies, lumbar region: Secondary | ICD-10-CM

## 2015-06-18 DIAGNOSIS — R0781 Pleurodynia: Secondary | ICD-10-CM

## 2015-06-18 DIAGNOSIS — J441 Chronic obstructive pulmonary disease with (acute) exacerbation: Secondary | ICD-10-CM | POA: Diagnosis not present

## 2015-06-18 MED ORDER — ROPIVACAINE HCL 2 MG/ML IJ SOLN: 10.0000 mL | Freq: Once | INTRAMUSCULAR | Status: DC

## 2015-06-18 MED ORDER — ROPIVACAINE HCL 2 MG/ML IJ SOLN
INTRAMUSCULAR | Status: AC
  Filled 2015-06-18: qty 10

## 2015-06-18 MED ORDER — DEXAMETHASONE SODIUM PHOSPHATE 10 MG/ML IJ SOLN: 10.0000 mg | Freq: Once | INTRAMUSCULAR | Status: DC

## 2015-06-18 MED ORDER — DEXAMETHASONE SODIUM PHOSPHATE 10 MG/ML IJ SOLN
INTRAMUSCULAR | Status: AC
  Filled 2015-06-18: qty 1

## 2015-06-18 NOTE — Patient Instructions (Signed)
Trigger Point Injection Trigger points are areas where you have muscle pain. A trigger point injection is a shot given in the trigger point to relieve that pain. A trigger point might feel like a knot in your muscle. It hurts to press on a trigger point. Sometimes the pain spreads out (radiates) to other parts of the body. For example, pressing on a trigger point in your shoulder might cause pain in your arm or neck. You might have one trigger point. Or, you might have more than one. People often have trigger points in their upper back and lower back. They also occur often in the neck and shoulders. Pain from a trigger point lasts for a long time. It can make it hard to keep moving. You might not be able to do the exercise or physical therapy that could help you deal with the pain. A trigger point injection may help. It does not work for everyone. But, it may relieve your pain for a few days or a few months. A trigger point injection does not cure long-lasting (chronic) pain. LET YOUR CAREGIVER KNOW ABOUT:  Any allergies (especially to latex, lidocaine, or steroids).  Blood-thinning medicines that you take. These drugs can lead to bleeding or bruising after an injection. They include:  Aspirin.  Ibuprofen.  Clopidogrel.  Warfarin.  Other medicines you take. This includes all vitamins, herbs, eyedrops, over-the-counter medicines, and creams.  Use of steroids.  Recent infections.  Past problems with numbing medicines.  Bleeding problems.  Surgeries you have had.  Other health problems. RISKS AND COMPLICATIONS A trigger point injection is a safe treatment. However, problems may develop, such as:  Minor side effects usually go away in 1 to 2 days. These may include:  Soreness.  Bruising.  Stiffness.  More serious problems are rare. But, they may include:  Bleeding under the skin (hematoma).  Skin infection.  Breaking off of the needle under your skin.  Lung  puncture.  The trigger point injection may not work for you. BEFORE THE PROCEDURE You may need to stop taking any medicine that thins your blood. This is to prevent bleeding and bruising. Usually these medicines are stopped several days before the injection. No other preparation is needed. PROCEDURE  A trigger point injection can be given in your caregiver's office or in a clinic. Each injection takes 2 minutes or less.  Your caregiver will feel for trigger points. The caregiver may use a marker to circle the area for the injection.  The skin over the trigger point will be washed with a germ-killing (antiseptic) solution.  The caregiver pinches the spot for the injection.  Then, a very thin needle is used for the shot. You may feel pain or a twitching feeling when the needle enters the trigger point.  A numbing solution may be injected into the trigger point. Sometimes a drug to keep down swelling, redness, and warmth (inflammation) is also injected.  Your caregiver moves the needle around the trigger zone until the tightness and twitching goes away.  After the injection, your caregiver may put gentle pressure over the injection site.  Then it is covered with a bandage. AFTER THE PROCEDURE  You can go right home after the injection.  The bandage can be taken off after a few hours.  You may feel sore and stiff for 1 to 2 days.  Go back to your regular activities slowly. Your caregiver may ask you to stretch your muscles. Do not do anything that takes   extra energy for a few days.  Follow your caregiver's instructions to manage and treat other pain.   This information is not intended to replace advice given to you by your health care provider. Make sure you discuss any questions you have with your health care provider.   Document Released: 04/09/2011 Document Revised: 08/15/2012 Document Reviewed: 04/09/2011 Else[employer] Interactive Patient Education 2016 [employer] Pain  Management Discharge Instructions  General Discharge Instructions :  If you need to reach your doctor call: Monday-Friday 8:00 am - 4:00 pm at [phone] or toll free [phone].  After clinic hours [phone] to have operator reach doctor.  Bring all of your medication bottles to all your appointments in the pain clinic.  To cancel or reschedule your appointment with Pain Management please remember to call 24 hours in advance to avoid a fee.  Refer to the educational materials which you have been given on: General Risks, I had my Procedure. Discharge Instructions, Post Sedation.  Post Procedure Instructions:  The drugs you were given will stay in your system until tomorrow, so for the next 24 hours you should not drive, make any legal decisions or drink any alcoholic beverages.  You may eat anything you prefer, but it is better to start with liquids then soups and crackers, and gradually work up to solid foods.  Please notify your doctor immediately if you have any unusual bleeding, trouble breathing or pain that is not related to your normal pain.  Depending on the type of procedure that was done, some parts of your body may feel week and/or numb.  This usually clears up by tonight or the next day.  Walk with the use of an assistive device or accompanied by an adult for the 24 hours.  You may use ice on the affected area for the first 24 hours.  Put ice in a Ziploc bag and cover with a towel and place against area 15 minutes on 15 minutes off.  You may switch to heat after 24 hours. 

## 2015-06-19 ENCOUNTER — Telehealth: Payer: Self-pay | Admitting: *Deleted

## 2015-06-19 NOTE — Progress Notes (Signed)
Subjective:  Patient ID: Robert Ball, male    DOB: [birth year]  Age: 70 y.o. MRN: [id number]  CC: Back Pain  procedure: Right posterior shoulder trigger point injection 2  HPI Robert Ball presents for right side upper abdominal pain and posterior back pain. He is followed by Dr. Harrington Challenger and has referred for evaluation management. He describes an area over the right anterior abdominal region that has caused him considerable deep-seated pain for the past 2-3 months. He's gone through an extensive workup for this including abdominal CT scan chest x-rays in an effort to find an underlying source. He denies any significant improvement with any medication management for physical therapy stretching strengthening exercises since his last evaluation and desires to proceed with a trigger point injection today.   He has a very complicated history of severe COPD and he is on oxygen at present and uses this at night as well. He takes continuous steroids for his COPD and uses oxycodone for chronic pain. He also takes Flexeril and lorazepam.  History [name] has a past medical history of PTSD (post-traumatic stress disorder); Hypertension; Anxiety; Depression; Prostate enlargement; GERD (gastroesophageal reflux disease); Headache; Elevated lipids; Shakes; COPD (chronic obstructive pulmonary disease) (HCC); and Cardiac defibrillator in place.   He has past surgical history that includes Neck surgery; Hernia repair; Hemorrhoid surgery; Cataract extraction w/ intraocular lens  implant, bilateral (Bilateral); Eye muscle surgery (Bilateral); and Icd lead removal (Left, 12/27/2014).   His family history includes Heart disease in his father and mother; Lymphoma in his father.He reports that he quit smoking about 7 years ago. His smoking use included Cigarettes. He has a 200 pack-year smoking history. He does not have any smokeless tobacco history on file. He reports that he does not drink alcohol or use illicit  drugs.   ---------------------------------------------------------------------------------------------------------------------- Past Medical History  Diagnosis Date  . PTSD (post-traumatic stress disorder)   . Hypertension   . Anxiety   . Depression   . Prostate enlargement   . GERD (gastroesophageal reflux disease)   . Headache   . Elevated lipids   . Shakes   . COPD (chronic obstructive pulmonary disease) (HCC)     On 2liter oxygen  . Cardiac defibrillator in place     Past Surgical History  Procedure Laterality Date  . Neck surgery    . Hernia repair    . Hemorrhoid surgery    . Cataract extraction w/ intraocular lens  implant, bilateral Bilateral   . Eye muscle surgery Bilateral   . Icd lead removal Left 12/27/2014    Procedure: ICD LEAD REMOVAL/ICD GENERATOR CHANGE OUT;  Surgeon: Marcina Millard, MD;  Location: [facility] ORS;  Service: Cardiovascular;  Laterality: Left;    Family History  Problem Relation Age of Onset  . Heart disease Father   . Heart disease Mother   . Lymphoma Father     Social History  Substance Use Topics  . Smoking status: Former Smoker -- 5.00 packs/day for 40 years    Types: Cigarettes    Quit date: 09/24/2007  . Smokeless tobacco: Not on file  . Alcohol Use: No     Comment: occ    ---------------------------------------------------------------------------------------------------------------------- Social History   Social History  . Marital Status: Married    Spouse Name: N/A  . Number of Children: N/A  . Years of Education: N/A   Social History Main Topics  . Smoking status: Former Smoker -- 5.00 packs/day for 40 years    Types: Cigarettes  Quit date: 09/24/2007  . Smokeless tobacco: None  . Alcohol Use: No     Comment: occ  . Drug Use: No  . Sexual Activity: Not Asked   Other Topics Concern  . None   Social History Narrative       ----------------------------------------------------------------------------------------------------------------------  ROS Review of Systems   Objective:  BP 162/79 mmHg  Pulse 74  Temp(Src) 97.3 F (36.3 C) (Oral)  Resp 16  Ht  (1.651 m)  Wt 180 lb (81.647 kg)  BMI 29.95 kg/m2  SpO2 100%  Physical Exam He is alert and oriented 3 cooperative compliant. His lungs show distant breath sounds. He is barrel chested using oxygen at present. I hear no wheezing or rales. His heart is regular rhythm without murmur. Inspection of the anterior abdominal wall with the patient in the supine position reveals some deep-seated tenderness with palpation in approximately 2-3 cm below the T8-T9 costal rib margins. This is in the right upper quadrant. His belly is distended and obese but no guarding or tenderness otherwise noted. Inspection of the posterior scapular region reveals some tenderness and a trigger point overlying the mid body of the right scapula otherwise no change on examination today.   Assessment & Plan:   Robert Ball was seen today for back pain.  Diagnoses and all orders for this visit:  Rib pain on right side -     TRIGGER POINT INJECTION -     dexamethasone (DECADRON) injection 10 mg; 1 mL (10 mg total) by Other route once. -     ropivacaine (PF) 2 mg/ml (0.2%) (NAROPIN) epidural 10 mL; 10 mLs by Epidural route once. -     TRIGGER POINT INJECTION; Future  Right-sided thoracic back pain -     TRIGGER POINT INJECTION -     dexamethasone (DECADRON) injection 10 mg; 1 mL (10 mg total) by Other route once. -     ropivacaine (PF) 2 mg/ml (0.2%) (NAROPIN) epidural 10 mL; 10 mLs by Epidural route once. -     TRIGGER POINT INJECTION; Future  DDD (degenerative disc disease), lumbar  COPD exacerbation (HCC)  Abdominal pain, right upper quadrant  Low back derangement syndrome -     TRIGGER POINT INJECTION -     dexamethasone (DECADRON) injection 10 mg; 1 mL (10 mg total)  by Other route once. -     ropivacaine (PF) 2 mg/ml (0.2%) (NAROPIN) epidural 10 mL; 10 mLs by Epidural route once.  Other orders -     ropivacaine (PF) 2 mg/ml (0.2%) (NAROPIN) 2 MG/ML epidural;  -     dexamethasone (DECADRON) 10 MG/ML injection;      ----------------------------------------------------------------------------------------------------------------------  Problem List Items Addressed This Visit    None    Visit Diagnoses    Rib pain on right side    -  Primary    Relevant Medications    dexamethasone (DECADRON) injection 10 mg    ropivacaine (PF) 2 mg/ml (0.2%) (NAROPIN) epidural 10 mL    Other Relevant Orders    TRIGGER POINT INJECTION    TRIGGER POINT INJECTION    Right-sided thoracic back pain        Relevant Medications    predniSONE (DELTASONE) 10 MG tablet    dexamethasone (DECADRON) injection 10 mg    ropivacaine (PF) 2 mg/ml (0.2%) (NAROPIN) epidural 10 mL    Other Relevant Orders    TRIGGER POINT INJECTION    TRIGGER POINT INJECTION    DDD (degenerative disc disease), lumbar  Relevant Medications    predniSONE (DELTASONE) 10 MG tablet    dexamethasone (DECADRON) injection 10 mg    COPD exacerbation (HCC)        Relevant Medications    predniSONE (DELTASONE) 10 MG tablet    dexamethasone (DECADRON) injection 10 mg    Abdominal pain, right upper quadrant        Low back derangement syndrome        Relevant Medications    predniSONE (DELTASONE) 10 MG tablet    dexamethasone (DECADRON) injection 10 mg    ropivacaine (PF) 2 mg/ml (0.2%) (NAROPIN) epidural 10 mL    Other Relevant Orders    TRIGGER POINT INJECTION       ----------------------------------------------------------------------------------------------------------------------  1. Rib pain on right side   2. Right-sided thoracic back pain   3. DDD (degenerative disc disease), lumbar   4. COPD exacerbation (HCC)   5. Abdominal pain, right upper  quadrant     ----------------------------------------------------------------------------------------------------------------------  I am having Robert Ball maintain his atorvastatin, omeprazole, ziprasidone, amLODipine, furosemide, tamsulosin, carvedilol, sertraline, potassium chloride SA, roflumilast, budesonide-formoterol, tiotropium, albuterol, albuterol, prochlorperazine, dimenhyDRINATE, loperamide, fenofibrate, acetaminophen, Vitamin D3, cyclobenzaprine, LORazepam, oxyCODONE-acetaminophen, cefUROXime, predniSONE, traMADol, and predniSONE. We will continue to administer dexamethasone and ropivacaine (PF) 2 mg/ml (0.2%).   Meds ordered this encounter  Medications  . predniSONE (DELTASONE) 10 MG tablet    Sig: Take 10 mg by mouth daily with breakfast.  . dexamethasone (DECADRON) injection 10 mg    Sig:   . ropivacaine (PF) 2 mg/ml (0.2%) (NAROPIN) epidural 10 mL    Sig:   . ropivacaine (PF) 2 mg/ml (0.2%) (NAROPIN) 2 MG/ML epidural    Sig:     Robert Ball, Delores: cabinet override  . dexamethasone (DECADRON) 10 MG/ML injection    Sig:     Robert Ball, Delores: cabinet override       Follow-up: We'll proceed with a trigger point injection to the right posterior sub-scapular region today as reviewed with the patient in full detail. All questions and concerns arise regarding this. We have talked about potential for injury to the underlying nerve considering his COPD and proximity to the rib margin. He has been instructed to seek ER evaluation for any shortness of breath following the procedure. We'll have him return to clinic approximately 1 month we'll plan on proceeding with a myoneural block to the right posterior scapular region.Also gone over options regarding a possible intercostal nerve block as an option however I like to proceed with the myoneural block first secondary to concerns regarding his COPD. We'll keep him on his current baseline medications at present.  Procedure:  Myoneural block: After informed consent was obtained and risks benefits reviewed with performed a trigger point injection to the aforementioned area. The area overlying the right sub-scapular region approximately T8 level right mid axillary line was prepped with alcohol and injected with a 25-gauge needle and a total dose of 8 cc of ropivacaine 0.2% next with approximately 8 mg of Decadron were infiltrated. There is no evidence of any air aspiration or difficulty with the procedure and he was convalesced discharged home in stable condition.   Yevette Edwards, MD

## 2015-06-19 NOTE — Telephone Encounter (Signed)
Per patient's wife he is doing well- a little sore but denies any complications.  Shatoya given note to put patient on schedule for 07-17-15 for return and possible TPI if needed.

## 2015-06-27 ENCOUNTER — Emergency Department: Payer: Medicare PPO

## 2015-06-27 ENCOUNTER — Emergency Department
Admission: EM | Admit: 2015-06-27 | Discharge: 2015-06-27 | Disposition: A | Discharge status: Home / Self Care | Payer: Medicare PPO | Attending: Emergency Medicine | Admitting: Emergency Medicine

## 2015-06-27 ENCOUNTER — Encounter: Payer: Self-pay | Admitting: Emergency Medicine

## 2015-06-27 DIAGNOSIS — J441 Chronic obstructive pulmonary disease with (acute) exacerbation: Secondary | ICD-10-CM

## 2015-06-27 DIAGNOSIS — Z880 Allergy status to penicillin: Secondary | ICD-10-CM | POA: Insufficient documentation

## 2015-06-27 DIAGNOSIS — R0602 Shortness of breath: Secondary | ICD-10-CM | POA: Diagnosis present

## 2015-06-27 DIAGNOSIS — Z87891 Personal history of nicotine dependence: Secondary | ICD-10-CM | POA: Diagnosis not present

## 2015-06-27 DIAGNOSIS — R339 Retention of urine, unspecified: Secondary | ICD-10-CM | POA: Insufficient documentation

## 2015-06-27 DIAGNOSIS — Z79899 Other long term (current) drug therapy: Secondary | ICD-10-CM | POA: Diagnosis not present

## 2015-06-27 DIAGNOSIS — I10 Essential (primary) hypertension: Secondary | ICD-10-CM | POA: Insufficient documentation

## 2015-06-27 DIAGNOSIS — R338 Other retention of urine: Secondary | ICD-10-CM

## 2015-06-27 DIAGNOSIS — Z9981 Dependence on supplemental oxygen: Secondary | ICD-10-CM | POA: Diagnosis not present

## 2015-06-27 DIAGNOSIS — Z7951 Long term (current) use of inhaled steroids: Secondary | ICD-10-CM | POA: Diagnosis not present

## 2015-06-27 LAB — URINALYSIS COMPLETE WITH MICROSCOPIC (ARMC ONLY)
Bacteria, UA: NONE SEEN
Bilirubin Urine: NEGATIVE
Glucose, UA: NEGATIVE mg/dL
HGB URINE DIPSTICK: NEGATIVE
Ketones, ur: NEGATIVE mg/dL
LEUKOCYTES UA: NEGATIVE
NITRITE: NEGATIVE
PH: 7 (ref 5.0–8.0)
PROTEIN: NEGATIVE mg/dL
SPECIFIC GRAVITY, URINE: 1.011 (ref 1.005–1.030)
Squamous Epithelial / LPF: NONE SEEN

## 2015-06-27 LAB — TROPONIN I
TROPONIN I: 0.04 ng/mL — AB (ref <0.031)
Troponin I: 0.05 ng/mL — ABNORMAL HIGH (ref <0.031)

## 2015-06-27 LAB — CBC
HEMATOCRIT: 41.6 % (ref 40.0–52.0)
HEMOGLOBIN: 14.5 g/dL (ref 13.0–18.0)
MCH: 31.3 pg (ref 26.0–34.0)
MCHC: 34.8 g/dL (ref 32.0–36.0)
MCV: 89.7 fL (ref 80.0–100.0)
Platelets: 163 10*3/uL (ref 150–440)
RBC: 4.64 MIL/uL (ref 4.40–5.90)
RDW: 14.4 % (ref 11.5–14.5)
WBC: 7.9 10*3/uL (ref 3.8–10.6)

## 2015-06-27 LAB — BASIC METABOLIC PANEL
Anion gap: 11 (ref 5–15)
BUN: 16 mg/dL (ref 6–20)
CHLORIDE: 101 mmol/L (ref 101–111)
CO2: 22 mmol/L (ref 22–32)
Calcium: 9.5 mg/dL (ref 8.9–10.3)
Creatinine, Ser: 1.44 mg/dL — ABNORMAL HIGH (ref 0.61–1.24)
GFR calc Af Amer: 55 mL/min — ABNORMAL LOW (ref >60)
GFR calc non Af Amer: 47 mL/min — ABNORMAL LOW (ref >60)
Glucose, Bld: 126 mg/dL — ABNORMAL HIGH (ref 65–99)
POTASSIUM: 4.2 mmol/L (ref 3.5–5.1)
SODIUM: 134 mmol/L — AB (ref 135–145)

## 2015-06-27 LAB — LIPASE, BLOOD: Lipase: 42 U/L (ref 11–51)

## 2015-06-27 MED ORDER — TAMSULOSIN HCL 0.4 MG PO CAPS: 0.4000 mg | ORAL_CAPSULE | Freq: Every day | ORAL | Status: DC

## 2015-06-27 MED ORDER — IPRATROPIUM-ALBUTEROL 0.5-2.5 (3) MG/3ML IN SOLN
3.0000 mL | Freq: Once | RESPIRATORY_TRACT | Status: AC
  Administered 2015-06-27: 3 mL via RESPIRATORY_TRACT
  Filled 2015-06-27: qty 3

## 2015-06-27 MED ORDER — PREDNISONE 10 MG PO TABS: 10.0000 mg | ORAL_TABLET | Freq: Every day | ORAL | Status: DC

## 2015-06-27 NOTE — ED Notes (Signed)
Lab called regarding add on lipase, will add on at this time.

## 2015-06-27 NOTE — ED Notes (Signed)
Foley inserted @ 1310 size 16 Fr. 460 ml drained from bladder upon insertion.

## 2015-06-27 NOTE — ED Notes (Signed)
Pt has had urinary retention since yesterday. Began with [facility] today. Pt labored upon arrival.

## 2015-06-27 NOTE — ED Notes (Signed)
X-ray at bedside

## 2015-06-27 NOTE — ED Notes (Signed)
Bladder scan 591, MD lord made aware.

## 2015-06-27 NOTE — Discharge Instructions (Signed)
You were found to have acute urinary retention and a Foley catheter was placed. Leave this in place until you follow up with the urologist. 14 days worth of Flomax was provided a prescription. For COPD exacerbation, UR started on prednisone for 4 more days. Continue your inhalers at home.   Acute Urinary Retention, Male Acute urinary retention is when you are unable to pee (urinate). Acute urinary retention is common in older men. Prostates can get bigger, which blocks the flow of pee.  HOME CARE  Drink enough fluids to keep your pee clear or pale yellow.  If you are sent home with a tube that drains the bladder (catheter), there will be a drainage bag attached to it. There are two types of bags. One is big that you can wear at night without having to empty it. One is smaller and needs to be emptied more often.  Keep the drainage bag empty.  Keep the drainage bag lower than your catheter.  Only take medicine as told by your doctor. GET HELP IF:  You have a low-grade fever.  You have spasms or you are leaking pee when you have spasms. GET HELP RIGHT AWAY IF:   You have chills or a fever.  Your catheter stops draining pee.  Your catheter falls out.  You have increased bleeding that does not stop after you have rested and increased the amount of fluids you had been drinking. MAKE SURE YOU:   Understand these instructions.  Will watch your condition.  Will get help right away if you are not doing well or get worse.   This information is not intended to replace advice given to you by your health care provider. Make sure you discuss any questions you have with your health care provider.   Document Released: 10/07/2007 Document Revised: 09/04/2014 Document Reviewed: 09/29/2012 Elsevier Interactive Patient Education 2016 [employer]  Chronic Obstructive Pulmonary Disease Exacerbation Chronic obstructive pulmonary disease (COPD) is a common lung problem. In COPD, the flow of air  from the lungs is limited. COPD exacerbations are times that breathing gets worse and you need extra treatment. Without treatment they can be life threatening. If they happen often, your lungs can become more damaged. If your COPD gets worse, your doctor may treat you with:  Medicines.  Oxygen.  Different ways to clear your airway, such as using a mask. HOME CARE  Do not smoke.  Avoid tobacco smoke and other things that bother your lungs.  If given, take your antibiotic medicine as told. Finish the medicine even if you start to feel better.  Only take medicines as told by your doctor.  Drink enough fluids to keep your pee (urine) clear or pale yellow (unless your doctor has told you not to).  Use a cool mist machine (vaporizer).  If you use oxygen or a machine that turns liquid medicine into a mist (nebulizer), continue to use them as told.  Keep up with shots (vaccinations) as told by your doctor.  Exercise regularly.  Eat healthy foods.  Keep all doctor visits as told. GET HELP RIGHT AWAY IF:  You are very short of breath and it gets worse.  You have trouble talking.  You have bad chest pain.  You have blood in your spit (sputum).  You have a fever.  You keep throwing up (vomiting).  You feel weak, or you pass out (faint).  You feel confused.  You keep getting worse. MAKE SURE YOU:  Understand these instructions.  Will watch your condition.  Will get help right away if you are not doing well or get worse.   This information is not intended to replace advice given to you by your health care provider. Make sure you discuss any questions you have with your health care provider.   Document Released: 04/09/2011 Document Revised: 05/11/2014 Document Reviewed: 12/23/2012 Elsevier Interactive Patient Education [employer]

## 2015-06-27 NOTE — Progress Notes (Signed)
Pt. Was taken of  bipap and placed on 4L nasal cannula per Dr. Shaune Pollack. Pt. Does not have sob and sa02=98%.

## 2015-06-27 NOTE — ED Notes (Signed)
Pt calling out, very anxious and agitated with Bipap mask on. MD Lord verbalized okay to remove pt from Bipap. RT called, Bipap removed. Pt placed on 4L [place], sating 98%, still labored breathing. Vitals stable, NAD noted.

## 2015-06-27 NOTE — ED Provider Notes (Signed)
[facility] Emergency Department Provider Note   ____________________________________________  Time seen: Upon ED EMS arrival I have reviewed the triage vital signs and the triage nursing note.  HISTORY  Chief Complaint Shortness of Breath   Historian Patient  HPI Robert Ball is a 70 y.o. male with a history of COPD who wears home 2 L O2, reports calling EMS due to problems urinating, and when EMS got there he also gave a history of shortness of breath 1 day and became even more short of breath while talking to them. He apparently had oxygenation in the 80s on his home 2 L and was turned up to 4 L nasal cannula. He had extremely tight breath sounds reported by EMS and received Solu-Medrol and DuoNeb treatment on the way over with some improvement in aeration but still extremely decreased with wheezing.  No reported fevers or productive sputum. No abdominal pain, vomiting or diarrhea.  Patient was treated in the emergency department for right-sided chest pain about 2 weeks ago and discharged home on prednisone and Ceftin and Dilaudid.    Past Medical History  Diagnosis Date  . PTSD (post-traumatic stress disorder)   . Hypertension   . Anxiety   . Depression   . Prostate enlargement   . GERD (gastroesophageal reflux disease)   . Headache   . Elevated lipids   . Shakes   . COPD (chronic obstructive pulmonary disease) (HCC)     On 2liter oxygen  . Cardiac defibrillator in place     Patient Active Problem List   Diagnosis Date Noted  . Abdominal pain 04/09/2015    Past Surgical History  Procedure Laterality Date  . Neck surgery    . Hernia repair    . Hemorrhoid surgery    . Cataract extraction w/ intraocular lens  implant, bilateral Bilateral   . Eye muscle surgery Bilateral   . Icd lead removal Left 12/27/2014    Procedure: ICD LEAD REMOVAL/ICD GENERATOR CHANGE OUT;  Surgeon: Marcina Millard, MD;  Location: [facility] ORS;  Service:  Cardiovascular;  Laterality: Left;    Current Outpatient Rx  Name  Route  Sig  Dispense  Refill  . acetaminophen (TYLENOL) 500 MG tablet   Oral   Take 500 mg by mouth every 6 (six) hours as needed for mild pain or headache.         . albuterol (PROVENTIL HFA;VENTOLIN HFA) 108 (90 BASE) MCG/ACT inhaler   Inhalation   Inhale 2 puffs into the lungs every 6 (six) hours as needed for wheezing or shortness of breath.         Marland Kitchen albuterol (PROVENTIL) (2.5 MG/3ML) 0.083% nebulizer solution   Nebulization   Take 2.5 mg by nebulization every 6 (six) hours as needed for wheezing or shortness of breath.         Marland Kitchen amLODipine (NORVASC) 10 MG tablet   Oral   Take 10 mg by mouth daily.         Marland Kitchen atorvastatin (LIPITOR) 40 MG tablet   Oral   Take 40 mg by mouth daily. Reported on 06/18/2015         . budesonide-formoterol (SYMBICORT) 160-4.5 MCG/ACT inhaler   Inhalation   Inhale 2 puffs into the lungs 2 (two) times daily.         . carvedilol (COREG) 12.5 MG tablet   Oral   Take 12.5 mg by mouth 2 (two) times daily.          Marland Kitchen  Cholecalciferol (VITAMIN D3) 5000 UNITS CAPS   Oral   Take 5,000 Units by mouth every other day. Reported on 06/18/2015         . cyclobenzaprine (FLEXERIL) 5 MG tablet   Oral   Take 1 tablet (5 mg total) by mouth 3 (three) times daily as needed for muscle spasms. Patient not taking: Reported on 05/02/2015   30 tablet   0   . dimenhyDRINATE (DRAMAMINE) 50 MG tablet   Oral   Take 50 mg by mouth every 8 (eight) hours as needed for nausea.         . fenofibrate 160 MG tablet   Oral   Take 160 mg by mouth daily.         . furosemide (LASIX) 40 MG tablet   Oral   Take 40 mg by mouth daily.         Marland Kitchen loperamide (IMODIUM) 2 MG capsule   Oral   Take 4 mg by mouth daily.          Marland Kitchen LORazepam (ATIVAN) 1 MG tablet   Oral   Take 1 tablet (1 mg total) by mouth every 8 (eight) hours as needed for anxiety. Patient not taking: Reported on  05/02/2015   30 tablet   0   . omeprazole (PRILOSEC) 20 MG capsule   Oral   Take 20 mg by mouth 2 (two) times daily.          Marland Kitchen oxyCODONE-acetaminophen (ROXICET) 5-325 MG tablet   Oral   Take 1-2 tablets by mouth every 6 (six) hours as needed for moderate pain or severe pain. Patient not taking: Reported on 05/02/2015   30 tablet   0   . potassium chloride SA (K-DUR,KLOR-CON) 20 MEQ tablet   Oral   Take 20 mEq by mouth daily.         . predniSONE (DELTASONE) 10 MG tablet   Oral   Take 1 tablet (10 mg total) by mouth daily.  once daily for 4 days   20 tablet   0   . prochlorperazine (COMPAZINE) 5 MG tablet   Oral   Take 5 mg by mouth 3 (three) times daily as needed for nausea or vomiting.          . roflumilast (DALIRESP) 500 MCG TABS tablet   Oral   Take 500 mcg by mouth daily.         . sertraline (ZOLOFT) 100 MG tablet   Oral   Take 100 mg by mouth 2 (two) times daily.         . tamsulosin (FLOMAX) 0.4 MG CAPS capsule   Oral   Take 1 capsule (0.4 mg total) by mouth daily.   14 capsule   0   . tiotropium (SPIRIVA) 18 MCG inhalation capsule   Inhalation   Place 18 mcg into inhaler and inhale daily.         . traMADol (ULTRAM) 50 MG tablet   Oral   Take 1 tablet (50 mg total) by mouth every 6 (six) hours as needed.   20 tablet   0   . ziprasidone (GEODON) 60 MG capsule   Oral   Take 60 mg by mouth at bedtime.           Allergies Aspirin; Ciprofloxacin hcl; Ibuprofen; Metronidazole; Naproxen; Penicillins; and Tetracyclines & related  Family History  Problem Relation Age of Onset  . Heart disease Father   . Heart disease Mother   .  Lymphoma Father     Social History Social History  Substance Use Topics  . Smoking status: Former Smoker -- 5.00 packs/day for 40 years    Types: Cigarettes    Quit date: 09/24/2007  . Smokeless tobacco: None  . Alcohol Use: No     Comment: occ    Review of Systems  Constitutional: Negative  for fever. Eyes: Negative for visual changes. ENT: Negative for sore throat. Cardiovascular: Negative for chest pain. Respiratory: Positive for shortness of breath. Gastrointestinal: Negative for abdominal pain, vomiting and diarrhea. Genitourinary: Positive for trouble urinating. Musculoskeletal: Negative for back pain. Skin: Negative for rash. Neurological: Negative for headache. 10 point Review of Systems otherwise negative ____________________________________________   PHYSICAL EXAM:  VITAL SIGNS: ED Triage Vitals  Enc Vitals Group     BP 06/27/15 1105 152/88 mmHg     Pulse Rate 06/27/15 1100 76     Resp 06/27/15 1100 30     Temp 06/27/15 1100 97.7 F (36.5 C)     Temp Source 06/27/15 1100 Oral     SpO2 06/27/15 1100 99 %     Weight 06/27/15 1100 180 lb (81.647 kg)     Height 06/27/15 1100  (1.651 m)     Head Cir --      Peak Flow --      Pain Score 06/27/15 1131 0     Pain Loc --      Pain Edu? --      Excl. in GC? --      Constitutional: Alert and oriented. Moderate respiratory distress. HEENT   Head: Normocephalic and atraumatic.      Eyes: Conjunctivae are normal. PERRL. Normal extraocular movements.      Ears:         Nose: No congestion/rhinnorhea.   Mouth/Throat: Mucous membranes are moist.   Neck: No stridor. Cardiovascular/Chest: Normal rate, regular rhythm.  No murmurs, rubs, or gallops. Respiratory: Moderate respiratory distress with moderate retractions. Decreased breath sounds throughout all fields with end expiratory wheezing throughout. No rhonchi. Gastrointestinal: Soft. No distention, no guarding, no rebound. Nontender.    Genitourinary/rectal:Deferred Musculoskeletal: Nontender with normal range of motion in all extremities. No joint effusions.  No lower extremity tenderness.  No edema. Neurologic:  Normal speech and language. No gross or focal neurologic deficits are appreciated. Skin:  Skin is warm, dry and intact. No rash  noted. Psychiatric: Mood and affect are normal. Speech and behavior are normal. Patient exhibits appropriate insight and judgment.  ____________________________________________   EKG I, Governor Rooks, MD, the attending physician have personally viewed and interpreted all ECGs.  87 bpm. Atrial sensed ventricularly paced. ____________________________________________  LABS (pertinent positives/negatives)  Urinalysis negative Basic metabolic panel without significant abnormality. Creatinine 1.44 CBC within normal limits Troponin 0.05 Lipase 42  ____________________________________________  RADIOLOGY All Xrays were viewed by me. Imaging interpreted by Radiologist.  Chest portable: No edema or consolidation. __________________________________________  PROCEDURES  Procedure(s) performed: None  Critical Care performed: None  ____________________________________________   ED COURSE / ASSESSMENT AND PLAN  Pertinent labs & imaging results that were available during my care of the patient were reviewed by me and considered in my medical decision making (see chart for details).   Patient called for symptoms of urinary retention but came in with acute respiratory distress due to COPD exacerbation. He had increased requirement of O2 from baseline. He was tried on brief BiPAP here and then rest requested to come back off of that and was stabilized  home 2 L. He did receive DuoNeb and Solu-Medrol.  His laboratory evaluation and chest x-ray showed no additional concern for new or different pulmonary cause.  I will place him on 4 more days of prednisone burst.  In terms of the acute urinary retention, his bladder scan was near 640. Upon further history, it turns out that the patient that the patient has been out of his Flomax for a few days due to difficulty in obtaining this prescription may order from the [place].   CONSULTATIONS:   None   Patient / Family / Caregiver informed of  clinical course, medical decision-making process, and agree with plan.   I discussed return precautions, follow-up instructions, and discharged instructions with patient and/or family.   ___________________________________________   FINAL CLINICAL IMPRESSION(S) / ED DIAGNOSES   Final diagnoses:  Acute urinary retention  COPD exacerbation (HCC)              Note: This dictation was prepared with Dragon dictation. Any transcriptional errors that result from this process are unintentional   Governor Rooks, MD 06/27/15 1434

## 2015-06-27 NOTE — ED Notes (Signed)
Pt will be d/c once d/c papers are placed.  

## 2015-06-27 NOTE — ED Notes (Signed)
Urine leg bag applied.

## 2015-07-01 ENCOUNTER — Ambulatory Visit (INDEPENDENT_AMBULATORY_CARE_PROVIDER_SITE_OTHER): Payer: Medicare PPO | Admitting: Urology

## 2015-07-01 ENCOUNTER — Encounter: Payer: Self-pay | Admitting: Urology

## 2015-07-01 VITALS — BP 158/83 | HR 70 | Ht 65.0 in | Wt 188.8 lb

## 2015-07-01 DIAGNOSIS — R338 Other retention of urine: Secondary | ICD-10-CM

## 2015-07-01 DIAGNOSIS — R310 Gross hematuria: Secondary | ICD-10-CM

## 2015-07-01 DIAGNOSIS — N401 Enlarged prostate with lower urinary tract symptoms: Secondary | ICD-10-CM | POA: Diagnosis not present

## 2015-07-01 DIAGNOSIS — N138 Other obstructive and reflux uropathy: Secondary | ICD-10-CM

## 2015-07-01 NOTE — Progress Notes (Signed)
Fill and Pull Catheter Removal  Patient is present today for a catheter removal.  Patient was cleaned and prepped in a sterile fashion of sterile water/ saline was instilled into the bladder when the patient felt the urge to urinate. 8ml of water was then drained from the balloon.  A 16 FR foley cath was removed from the bladder no complications were noted .  Patient as then given some time to void on their own.  Patient void About on his own after some time.  Patient tolerated well.  Preformed by: K.Russell,CMA  Follow up/ Additional notes: Pt instructed in he develop any complication with urination to contact our office.

## 2015-07-01 NOTE — Progress Notes (Signed)
Consultation for urinary retention Requested by Dr. Shaune Pollack, PCP Dr. Harrington Challenger   History of present illness: 70 year old with a history of BPH on tamsulosin apparently followed at the [place]. He ran out of tamsulosin and had a several day history of progressive difficulty voiding. He presented to the emergency department where he also had a COPD exacerbation. Is on home O2. A bladder scan revealed 740 mL in the bladder and a 16 French Foley was placed draining 460 mL. his UA was negative. He restarted tamsulosin.  The patient's creatinine was 1.44 from a baseline of 1.26. He did undergo CT scan December 2016 which showed no stone or hydronephrosis. Delayed imaging was normal. The prostate was about 35 g on the CT.  The patient denies any prior urologic surgery.  The patient noticed some mild hematuria in the bag after the Foley was placed. He is a long time smoker. Again CT scan above normal. Also is having a lot of urgency and voiding around the catheter. He had an appointment next week but wanted to be seen earlier.  I reviewed his past medical history, past surgical history, medications and allergies.  Physical exam: Patient with increased work of breathing and slightly increased respiratory rate on O2. Patient and wife report he is breathing at baseline. GU: The penis is circumcised without mass or lesion. Testicles descended bilaterally and palpably normal. I didn't appreciate any inguinal hernias. On digital rectal exam the prostate was only mildly enlarged with no nodularity, induration or tenderness. His urine is clear in the bag.  The patient was filled with 140 mL of fluid. He voided by forcefully blowing out the catheter and on to the chucks. They were quite wet. Only a small amount went in the urinal. He was covered with 1 cephalexin 500 mg.   Assessment/plan: #1 BPH-continue tamsulosin. He has not had any syncope and can take tamsulosin twice a day if needed. He had a normal exam today. #2  urinary retention-patient passed voiding trial and he'll continue to monitor. If he can void I instructed them to get back here this afternoon. #3 gross hematuria-likely from Foley catheter placement but given recent retention, urgency and history of smoking discussed nature risk and benefits of cystoscopy and he'll  follow-up for this next week. Prior CT scan an upper tract imaging normal.

## 2015-07-03 DIAGNOSIS — N401 Enlarged prostate with lower urinary tract symptoms: Secondary | ICD-10-CM

## 2015-07-03 DIAGNOSIS — N138 Other obstructive and reflux uropathy: Secondary | ICD-10-CM | POA: Insufficient documentation

## 2015-07-09 ENCOUNTER — Encounter: Payer: Self-pay | Admitting: Urology

## 2015-07-09 ENCOUNTER — Telehealth: Payer: Self-pay | Admitting: Urology

## 2015-07-09 ENCOUNTER — Ambulatory Visit (INDEPENDENT_AMBULATORY_CARE_PROVIDER_SITE_OTHER): Payer: Medicare PPO | Admitting: Urology

## 2015-07-09 ENCOUNTER — Ambulatory Visit: Payer: Self-pay

## 2015-07-09 VITALS — BP 126/77 | HR 81 | Ht 65.0 in | Wt 190.3 lb

## 2015-07-09 DIAGNOSIS — R310 Gross hematuria: Secondary | ICD-10-CM

## 2015-07-09 DIAGNOSIS — N400 Enlarged prostate without lower urinary tract symptoms: Secondary | ICD-10-CM

## 2015-07-09 DIAGNOSIS — R339 Retention of urine, unspecified: Secondary | ICD-10-CM

## 2015-07-09 LAB — URINALYSIS, COMPLETE
Bilirubin, UA: NEGATIVE
GLUCOSE, UA: NEGATIVE
KETONES UA: NEGATIVE
NITRITE UA: NEGATIVE
Protein, UA: NEGATIVE
RBC UA: NEGATIVE
Specific Gravity, UA: 1.015 (ref 1.005–1.030)
UUROB: 0.2 mg/dL (ref 0.2–1.0)
pH, UA: 6 (ref 5.0–7.5)

## 2015-07-09 LAB — MICROSCOPIC EXAMINATION
Bacteria, UA: NONE SEEN
RBC MICROSCOPIC, UA: NONE SEEN /HPF (ref 0–?)

## 2015-07-09 LAB — BLADDER SCAN AMB NON-IMAGING: SCAN RESULT: 21

## 2015-07-09 MED ORDER — LIDOCAINE HCL 2 % EX GEL
1.0000 "application " | Freq: Once | CUTANEOUS | Status: AC
  Administered 2015-07-09: 1 via URETHRAL

## 2015-07-09 MED ORDER — CEPHALEXIN 500 MG PO CAPS
500.0000 mg | ORAL_CAPSULE | ORAL | Status: AC
  Administered 2015-07-09: 500 mg via ORAL

## 2015-07-09 MED ORDER — TAMSULOSIN HCL 0.4 MG PO CAPS: 0.4000 mg | ORAL_CAPSULE | Freq: Every day | ORAL | Status: DC

## 2015-07-09 NOTE — Telephone Encounter (Signed)
Patient was seen in the office today by Dr. Marlou PorchHerrick.  He was instructed to double up on his Tamsulosin.  He will need a refill called into [facility].

## 2015-07-09 NOTE — Telephone Encounter (Signed)
Refills sent to pharmacy. 

## 2015-07-09 NOTE — Progress Notes (Signed)
Bladder Scan Patient void: 21 ml Performed By: Theotis BurrowK.[email or web address],CMA

## 2015-07-09 NOTE — Procedures (Signed)
      Cystoscopy Procedure Note  Patient identification was confirmed, informed consent was obtained, and patient was prepped using Betadine solution.  Lidocaine jelly was administered per urethral meatus.    Preoperative abx where received prior to procedure.     Pre-Procedure: - Inspection reveals a normal caliber ureteral meatus.  Procedure: The flexible cystoscope was introduced without difficulty - No urethral strictures/lesions are present. - Enlarged prostate with lateral lobe obstruction - Normal bladder neck - Bilateral ureteral orifices identified - Bladder mucosa  reveals no ulcers, tumors, or lesions - induration consistent with infection - No bladder stones - No trabeculation  Retroflexion shows normal bladder neck.   Post-Procedure: - Patient tolerated the procedure well   Patient presented today for cystoscopy. The patient has a history of urinary retention and gross hematuria. His catheter was removed last week. Since his catheter has been removed he is voiding every 15 minutes. He had a second episode of gross hematuria or in the week. He also has some dysuria. He has a weak stream. He has urge and urge incontinence.  Patient's UA demonstrated pyuria without nitrites. We opted to proceed with cystoscopy.  Findings: No bladder tumors or abnormal appearing mucosa. There was some induration consistent with infection. The patient's prostate was obstructive with kissing lateral lobes, no median lobe or median bar. The prostatic urethra was proximally 3 cm in length.  Recommendations: Our plan is to send the patient's urine for culture but start him in empirically on Unasyn. If his symptoms are not improved over the course of the next 7 days while on antibiotics we discussed replacing the catheter given the patient's advanced COPD and inability to ambulate quick enough to accommodate his severe urinary urgency.  Given the appearance of his prostate cystoscopically,  this patient would also be a great candidate for a uro-lift procedure. As such, I have opted to have the patient follow-up with Dr. Thea SilversmithMackenzie for further discussion in this regard.  Perhaps, this could be done using a spinal anesthesia.  Sent patient's urine for cytology as well to ensure this is not CIS.

## 2015-07-10 ENCOUNTER — Other Ambulatory Visit: Payer: Self-pay

## 2015-07-10 DIAGNOSIS — N390 Urinary tract infection, site not specified: Secondary | ICD-10-CM

## 2015-07-10 MED ORDER — AMOXICILLIN-POT CLAVULANATE 875-125 MG PO TABS: 1.0000 | ORAL_TABLET | Freq: Two times a day (BID) | ORAL | Status: DC

## 2015-07-10 NOTE — Progress Notes (Signed)
Pt wife called stating when she went to pick up tamsulosin there was not abx at pharmacy. Per Dr. Marlou PorchHerrick pt needs augmentin 875 x7 days due to unasyn being an IV medication. Medication sent to pharmacy. Not able to contact pt due to no phone number in system.

## 2015-07-11 ENCOUNTER — Emergency Department
Admission: EM | Admit: 2015-07-11 | Discharge: 2015-07-11 | Disposition: A | Discharge status: Home / Self Care | Payer: Medicare PPO | Attending: Emergency Medicine | Admitting: Emergency Medicine

## 2015-07-11 ENCOUNTER — Emergency Department: Payer: Medicare PPO

## 2015-07-11 ENCOUNTER — Encounter: Payer: Self-pay | Admitting: *Deleted

## 2015-07-11 DIAGNOSIS — S0121XA Laceration without foreign body of nose, initial encounter: Secondary | ICD-10-CM | POA: Diagnosis not present

## 2015-07-11 DIAGNOSIS — J449 Chronic obstructive pulmonary disease, unspecified: Secondary | ICD-10-CM | POA: Diagnosis not present

## 2015-07-11 DIAGNOSIS — S80211A Abrasion, right knee, initial encounter: Secondary | ICD-10-CM

## 2015-07-11 DIAGNOSIS — Y999 Unspecified external cause status: Secondary | ICD-10-CM | POA: Diagnosis not present

## 2015-07-11 DIAGNOSIS — I5022 Chronic systolic (congestive) heart failure: Secondary | ICD-10-CM | POA: Insufficient documentation

## 2015-07-11 DIAGNOSIS — Z7952 Long term (current) use of systemic steroids: Secondary | ICD-10-CM | POA: Insufficient documentation

## 2015-07-11 DIAGNOSIS — N401 Enlarged prostate with lower urinary tract symptoms: Secondary | ICD-10-CM | POA: Diagnosis not present

## 2015-07-11 DIAGNOSIS — Z87891 Personal history of nicotine dependence: Secondary | ICD-10-CM | POA: Insufficient documentation

## 2015-07-11 DIAGNOSIS — Y9289 Other specified places as the place of occurrence of the external cause: Secondary | ICD-10-CM | POA: Insufficient documentation

## 2015-07-11 DIAGNOSIS — S60221A Contusion of right hand, initial encounter: Secondary | ICD-10-CM | POA: Diagnosis not present

## 2015-07-11 DIAGNOSIS — S0992XA Unspecified injury of nose, initial encounter: Secondary | ICD-10-CM | POA: Diagnosis present

## 2015-07-11 DIAGNOSIS — Z79899 Other long term (current) drug therapy: Secondary | ICD-10-CM | POA: Insufficient documentation

## 2015-07-11 DIAGNOSIS — Z95810 Presence of automatic (implantable) cardiac defibrillator: Secondary | ICD-10-CM | POA: Diagnosis not present

## 2015-07-11 DIAGNOSIS — S60511A Abrasion of right hand, initial encounter: Secondary | ICD-10-CM

## 2015-07-11 DIAGNOSIS — S8001XA Contusion of right knee, initial encounter: Secondary | ICD-10-CM | POA: Diagnosis not present

## 2015-07-11 DIAGNOSIS — R51 Headache: Secondary | ICD-10-CM | POA: Diagnosis not present

## 2015-07-11 DIAGNOSIS — Y9389 Activity, other specified: Secondary | ICD-10-CM | POA: Insufficient documentation

## 2015-07-11 DIAGNOSIS — I130 Hypertensive heart and chronic kidney disease with heart failure and stage 1 through stage 4 chronic kidney disease, or unspecified chronic kidney disease: Secondary | ICD-10-CM | POA: Diagnosis not present

## 2015-07-11 DIAGNOSIS — W228XXA Striking against or struck by other objects, initial encounter: Secondary | ICD-10-CM | POA: Insufficient documentation

## 2015-07-11 DIAGNOSIS — N138 Other obstructive and reflux uropathy: Secondary | ICD-10-CM | POA: Diagnosis not present

## 2015-07-11 DIAGNOSIS — S0083XA Contusion of other part of head, initial encounter: Secondary | ICD-10-CM

## 2015-07-11 DIAGNOSIS — N183 Chronic kidney disease, stage 3 (moderate): Secondary | ICD-10-CM | POA: Insufficient documentation

## 2015-07-11 DIAGNOSIS — W19XXXA Unspecified fall, initial encounter: Secondary | ICD-10-CM

## 2015-07-11 MED ORDER — METHYLPREDNISOLONE SODIUM SUCC 125 MG IJ SOLR
125.0000 mg | Freq: Once | INTRAMUSCULAR | Status: AC
  Administered 2015-07-11: 125 mg via INTRAMUSCULAR
  Filled 2015-07-11: qty 2

## 2015-07-11 MED ORDER — TRAMADOL HCL 50 MG PO TABS
50.0000 mg | ORAL_TABLET | Freq: Four times a day (QID) | ORAL | Status: DC | PRN
Start: 2015-07-11 — End: 2015-07-17

## 2015-07-11 MED ORDER — ALBUTEROL SULFATE (2.5 MG/3ML) 0.083% IN NEBU
2.5000 mg | INHALATION_SOLUTION | Freq: Once | RESPIRATORY_TRACT | Status: AC
  Administered 2015-07-11: 2.5 mg via RESPIRATORY_TRACT
  Filled 2015-07-11: qty 3

## 2015-07-11 NOTE — Discharge Instructions (Signed)
Facial Laceration ° A facial laceration is a cut on the face. These injuries can be painful and cause bleeding. Lacerations usually heal quickly, but they need special care to reduce scarring. °DIAGNOSIS  °Your health care provider will take a medical history, ask for details about how the injury occurred, and examine the wound to determine how deep the cut is. °TREATMENT  °Some facial lacerations may not require closure. Others may not be able to be closed because of an increased risk of infection. The risk of infection and the chance for successful closure will depend on various factors, including the amount of time since the injury occurred. °The wound may be cleaned to help prevent infection. If closure is appropriate, pain medicines may be given if needed. Your health care provider will use stitches (sutures), wound glue (adhesive), or skin adhesive strips to repair the laceration. These tools bring the skin edges together to allow for faster healing and a better cosmetic outcome. If needed, you may also be given a tetanus shot. °HOME CARE INSTRUCTIONS °· Only take over-the-counter or prescription medicines as directed by your health care provider. °· Follow your health care provider's instructions for wound care. These instructions will vary depending on the technique used for closing the wound. °For Sutures: °· Keep the wound clean and dry.   °· If you were given a bandage (dressing), you should change it at least once a day. Also change the dressing if it becomes wet or dirty, or as directed by your health care provider.   °· Wash the wound with soap and water 2 times a day. Rinse the wound off with water to remove all soap. Pat the wound dry with a clean towel.   °· After cleaning, apply a thin layer of the antibiotic ointment recommended by your health care provider. This will help prevent infection and keep the dressing from sticking.   °· You may shower as usual after the first 24 hours. Do not soak the  wound in water until the sutures are removed.   °· Get your sutures removed as directed by your health care provider. With facial lacerations, sutures should usually be taken out after 4-5 days to avoid stitch marks.   °· Wait a few days after your sutures are removed before applying any makeup. °For Skin Adhesive Strips: °· Keep the wound clean and dry.   °· Do not get the skin adhesive strips wet. You may bathe carefully, using caution to keep the wound dry.   °· If the wound gets wet, pat it dry with a clean towel.   °· Skin adhesive strips will fall off on their own. You may trim the strips as the wound heals. Do not remove skin adhesive strips that are still stuck to the wound. They will fall off in time.   °For Wound Adhesive: °· You may briefly wet your wound in the shower or bath. Do not soak or scrub the wound. Do not swim. Avoid periods of heavy sweating until the skin adhesive has fallen off on its own. After showering or bathing, gently pat the wound dry with a clean towel.   °· Do not apply liquid medicine, cream medicine, ointment medicine, or makeup to your wound while the skin adhesive is in place. This may loosen the film before your wound is healed.   °· If a dressing is placed over the wound, be careful not to apply tape directly over the skin adhesive. This may cause the adhesive to be pulled off before the wound is healed.   °· Avoid   prolonged exposure to sunlight or tanning lamps while the skin adhesive is in place.  The skin adhesive will usually remain in place for 5-10 days, then naturally fall off the skin. Do not pick at the adhesive film.  After Healing: Once the wound has healed, cover the wound with sunscreen during the day for 1 full year. This can help minimize scarring. Exposure to ultraviolet light in the first year will darken the scar. It can take 1-2 years for the scar to lose its redness and to heal completely.  SEEK MEDICAL CARE IF:  You have a fever. SEEK IMMEDIATE  MEDICAL CARE IF:  You have redness, pain, or swelling around the wound.   You see ayellowish-white fluid (pus) coming from the wound.    This information is not intended to replace advice given to you by your health care provider. Make sure you discuss any questions you have with your health care provider.   Document Released: 05/28/2004 Document Revised: 05/11/2014 Document Reviewed: 12/01/2012 Elsevier Interactive Patient Education 2016 [employer].  Facial or Scalp Contusion A facial or scalp contusion is a deep bruise on the face or head. Injuries to the face and head generally cause a lot of swelling, especially around the eyes. Contusions are the result of an injury that caused bleeding under the skin. The contusion may turn blue, purple, or yellow. Minor injuries will give you a painless contusion, but more severe contusions may stay painful and swollen for a few weeks.  CAUSES  A facial or scalp contusion is caused by a blunt injury or trauma to the face or head area.  SIGNS AND SYMPTOMS   Swelling of the injured area.   Discoloration of the injured area.   Tenderness, soreness, or pain in the injured area.  DIAGNOSIS  The diagnosis can be made by taking a medical history and doing a physical exam. An X-ray exam, CT scan, or MRI may be needed to determine if there are any associated injuries, such as broken bones (fractures). TREATMENT  Often, the best treatment for a facial or scalp contusion is applying cold compresses to the injured area. Over-the-counter medicines may also be recommended for pain control.  HOME CARE INSTRUCTIONS   Only take over-the-counter or prescription medicines as directed by your health care provider.   Apply ice to the injured area.   Put ice in a plastic bag.   Place a towel between your skin and the bag.   Leave the ice on for 20 minutes, 2-3 times a day.  SEEK MEDICAL CARE IF:  You have bite problems.   You have pain with  chewing.   You are concerned about facial defects. SEEK IMMEDIATE MEDICAL CARE IF:  You have severe pain or a headache that is not relieved by medicine.   You have unusual sleepiness, confusion, or personality changes.   You throw up (vomit).   You have a persistent nosebleed.   You have double vision or blurred vision.   You have fluid drainage from your nose or ear.   You have difficulty walking or using your arms or legs.  MAKE SURE YOU:   Understand these instructions.  Will watch your condition.  Will get help right away if you are not doing well or get worse.   This information is not intended to replace advice given to you by your health care provider. Make sure you discuss any questions you have with your health care provider.   Document Released: 05/28/2004  Document Revised: 05/11/2014 Document Reviewed: 12/01/2012 Elsevier Interactive Patient Education 2016 [employer]  Hand Contusion A hand contusion is a deep bruise on your hand area. Contusions are the result of an injury that caused bleeding under the skin. The contusion may turn blue, purple, or yellow. Minor injuries will give you a painless contusion, but more severe contusions may stay painful and swollen for a few weeks. CAUSES  A contusion is usually caused by a blow, trauma, or direct force to an area of the body. SYMPTOMS   Swelling and redness of the injured area.  Discoloration of the injured area.  Tenderness and soreness of the injured area.  Pain. DIAGNOSIS  The diagnosis can be made by taking a history and performing a physical exam. An X-ray, CT scan, or MRI may be needed to determine if there were any associated injuries, such as broken bones (fractures). TREATMENT  Often, the best treatment for a hand contusion is resting, elevating, icing, and applying cold compresses to the injured area. Over-the-counter medicines may also be recommended for pain control. HOME CARE  INSTRUCTIONS   Put ice on the injured area.  Put ice in a plastic bag.  Place a towel between your skin and the bag.  Leave the ice on for 15-20 minutes, 03-04 times a day.  Only take over-the-counter or prescription medicines as directed by your caregiver. Your caregiver may recommend avoiding anti-inflammatory medicines (aspirin, ibuprofen, and naproxen) for 48 hours because these medicines may increase bruising.  If told, use an elastic wrap as directed. This can help reduce swelling. You may remove the wrap for sleeping, showering, and bathing. If your fingers become numb, cold, or blue, take the wrap off and reapply it more loosely.  Elevate your hand with pillows to reduce swelling.  Avoid overusing your hand if it is painful. SEEK IMMEDIATE MEDICAL CARE IF:   You have increased redness, swelling, or pain in your hand.  Your swelling or pain is not relieved with medicines.  You have loss of feeling in your hand or are unable to move your fingers.  Your hand turns cold or blue.  You have pain when you move your fingers.  Your hand becomes warm to the touch.  Your contusion does not improve in 2 days. MAKE SURE YOU:   Understand these instructions.  Will watch your condition.  Will get help right away if you are not doing well or get worse.   This information is not intended to replace advice given to you by your health care provider. Make sure you discuss any questions you have with your health care provider.   Document Released: 10/10/2001 Document Revised: 01/13/2012 Document Reviewed: 10/12/2011 Elsevier Interactive Patient Education 2016 [employer]  Skin Tear Care A skin tear is a wound in which the top layer of skin has peeled off. This is a common problem with aging because the skin becomes thinner and more fragile as a person gets older. In addition, some medicines, such as oral corticosteroids, can lead to skin thinning if taken for long periods of time.   A skin tear is often repaired with tape or skin adhesive strips. This keeps the skin that has been peeled off in contact with the healthier skin beneath. Depending on the location of the wound, a bandage (dressing) may be applied over the tape or skin adhesive strips. Sometimes, during the healing process, the skin turns black and dies. Even when this happens, the torn skin acts as a  good dressing until the skin underneath gets healthier and repairs itself. HOME CARE INSTRUCTIONS   Change dressings once per day or as directed by your caregiver.  Gently clean the skin tear and the area around the tear using saline solution or mild soap and water.  Do not rub the injured skin dry. Let the area air dry.  Apply petroleum jelly or an antibiotic cream or ointment to keep the tear moist. This will help the wound heal. Do not allow a scab to form.  If the dressing sticks before the next dressing change, moisten it with warm soapy water and gently remove it.  Protect the injured skin until it has healed.  Only take over-the-counter or prescription medicines as directed by your caregiver.  Take showers or baths using warm soapy water. Apply a new dressing after the shower or bath.  Keep all follow-up appointments as directed by your caregiver.  SEEK IMMEDIATE MEDICAL CARE IF:   You have redness, swelling, or increasing pain in the skin tear.  You havepus coming from the skin tear.  You have chills.  You have a red streak that goes away from the skin tear.  You have a bad smell coming from the tear or dressing.  You have a fever or persistent symptoms for more than 2-3 days.  You have a fever and your symptoms suddenly get worse. MAKE SURE YOU:  Understand these instructions.  Will watch this condition.  Will get help right away if your child is not doing well or gets worse.   This information is not intended to replace advice given to you by your health care provider. Make sure  you discuss any questions you have with your health care provider.   Document Released: 01/13/2001 Document Revised: 01/13/2012 Document Reviewed: 11/02/2011 [employer] Interactive Patient Education [employer]

## 2015-07-11 NOTE — ED Provider Notes (Signed)
[facility] Emergency Department Provider Note  ____________________________________________  Time seen: Approximately 3:55 PM  I have reviewed the triage vital signs and the nursing notes.   HISTORY  Chief Complaint Fall    HPI Robert Ball is a 70 y.o. male who presents to the emergency department via EMS status post a fall. Patient states that he was drinking today and tried to exit his vehicle. He states his driveway is on a downhill slope and he lost his balance exiting the vehicle and did not regain same before falling. Patient states that he tried to catch himself with his right hand and landed on his right knee. He did strike his face on the concrete. He endorses a laceration to his nose and abrasions to his hand and knee. He denies any loss of consciousness. He denies any headache but endorses visual acuity changes. Patient denies any neck pain, chest pain, shortness of breath, nausea or vomiting. Patient denies any blood thinners.   Past Medical History  Diagnosis Date  . PTSD (post-traumatic stress disorder)   . Hypertension   . Anxiety   . Depression   . Prostate enlargement   . GERD (gastroesophageal reflux disease)   . Headache   . Elevated lipids   . Shakes   . COPD (chronic obstructive pulmonary disease) (HCC)     On 2liter oxygen  . Cardiac defibrillator in place     Patient Active Problem List   Diagnosis Date Noted  . Benign prostatic hyperplasia with urinary obstruction 07/03/2015  . Chronic LBP 04/16/2015  . Abdominal pain 04/09/2015  . Clinical depression 01/31/2015  . Other long term (current) drug therapy 01/31/2015  . 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 Chest pain 01/02/2015  . Venous insufficiency of leg 01/02/2015  . Pulmonary hypertension (HCC) 03/20/2014  . Chronic systolic heart failure (HCC) 03/20/2014  . Allergic rhinitis 01/25/2014  . Back pain, chronic 01/25/2014  . Chronic diarrhea 01/25/2014  .  Chronic kidney disease (CKD), stage III (moderate) 01/25/2014  . Benign essential HTN 01/25/2014  . Combined fat and carbohydrate induced hyperlipemia 01/25/2014  . Has a tremor 01/25/2014  . Oxygen desaturation 12/28/2013  . Subclinical hypothyroidism 12/28/2012  . Encounter for issue of repeat prescription 06/23/2012  . Acid reflux 12/17/2011  . Neurosis, posttraumatic 12/17/2011  . Persons encountering health services in other specified circumstances 12/17/2011  . Encounter for other specified special examinations 12/17/2011  . Colon polyp 12/10/2011  . Chronic obstructive pulmonary disease (HCC) 12/10/2011  . Gout 12/10/2011  . History of biliary T-tube placement 12/10/2011    Past Surgical History  Procedure Laterality Date  . Neck surgery    . Hernia repair    . Hemorrhoid surgery    . Cataract extraction w/ intraocular lens  implant, bilateral Bilateral   . Eye muscle surgery Bilateral   . Icd lead removal Left 12/27/2014    Procedure: ICD LEAD REMOVAL/ICD GENERATOR CHANGE OUT;  Surgeon: Marcina Millard, MD;  Location: [facility] ORS;  Service: Cardiovascular;  Laterality: Left;  . Back surgery      Current Outpatient Rx  Name  Route  Sig  Dispense  Refill  . acetaminophen (TYLENOL) 500 MG tablet   Oral   Take 500 mg by mouth every 6 (six) hours as needed for mild pain or headache.         . albuterol (PROVENTIL HFA;VENTOLIN HFA) 108 (90 BASE) MCG/ACT inhaler   Inhalation   Inhale 2 puffs into the lungs  every 6 (six) hours as needed for wheezing or shortness of breath.         Marland Kitchen albuterol (PROVENTIL) (2.5 MG/3ML) 0.083% nebulizer solution   Nebulization   Take 2.5 mg by nebulization every 6 (six) hours as needed for wheezing or shortness of breath.         Marland Kitchen amLODipine (NORVASC) 10 MG tablet   Oral   Take 10 mg by mouth daily.         Marland Kitchen amoxicillin-clavulanate (AUGMENTIN) 875-125 MG tablet   Oral   Take 1 tablet by mouth 2 (two) times daily.   14 tablet    0   . atorvastatin (LIPITOR) 40 MG tablet   Oral   Take 40 mg by mouth daily. Reported on 06/18/2015         . budesonide-formoterol (SYMBICORT) 160-4.5 MCG/ACT inhaler   Inhalation   Inhale into the lungs.         . carvedilol (COREG) 12.5 MG tablet   Oral   Take 12.5 mg by mouth 2 (two) times daily.          . cetirizine (ZYRTEC) 10 MG tablet   Oral   Take by mouth.         . Cholecalciferol (VITAMIN D3) 5000 UNITS CAPS   Oral   Take 5,000 Units by mouth every other day. Reported on 06/18/2015         . cyclobenzaprine (FLEXERIL) 5 MG tablet   Oral   Take 1 tablet (5 mg total) by mouth 3 (three) times daily as needed for muscle spasms.   30 tablet   0   . dimenhyDRINATE (DRAMAMINE) 50 MG tablet   Oral   Take 50 mg by mouth every 8 (eight) hours as needed for nausea.         . fenofibrate 160 MG tablet   Oral   Take 160 mg by mouth daily.         . furosemide (LASIX) 40 MG tablet   Oral   Take 40 mg by mouth daily.         Marland Kitchen loperamide (IMODIUM) 2 MG capsule   Oral   Take 4 mg by mouth daily.          Marland Kitchen LORazepam (ATIVAN) 1 MG tablet   Oral   Take 1 tablet (1 mg total) by mouth every 8 (eight) hours as needed for anxiety.   30 tablet   0   . omeprazole (PRILOSEC) 20 MG capsule   Oral   Take 20 mg by mouth 2 (two) times daily.          Marland Kitchen oxyCODONE-acetaminophen (ROXICET) 5-325 MG tablet   Oral   Take 1-2 tablets by mouth every 6 (six) hours as needed for moderate pain or severe pain.   30 tablet   0   . potassium chloride (KLOR-CON) 20 MEQ packet   Oral   Take by mouth.         . potassium chloride SA (K-DUR,KLOR-CON) 20 MEQ tablet   Oral   Take 20 mEq by mouth daily.         . predniSONE (DELTASONE) 10 MG tablet   Oral   Take 1 tablet (10 mg total) by mouth daily. 50mg  once daily for 4 days   20 tablet   0   . predniSONE (DELTASONE) 20 MG tablet   Oral   Take 40 mg by mouth daily. Reported on 07/09/2015  0   .  prochlorperazine (COMPAZINE) 5 MG tablet   Oral   Take 5 mg by mouth 3 (three) times daily as needed for nausea or vomiting.          . roflumilast (DALIRESP) 500 MCG TABS tablet   Oral   Take 500 mcg by mouth daily.         . sertraline (ZOLOFT) 100 MG tablet   Oral   Take 100 mg by mouth 2 (two) times daily.         . tamsulosin (FLOMAX) 0.4 MG CAPS capsule   Oral   Take 1 capsule (0.4 mg total) by mouth daily.   14 capsule   3   . tiotropium (SPIRIVA) 18 MCG inhalation capsule   Inhalation   Place 18 mcg into inhaler and inhale daily.         . traMADol (ULTRAM) 50 MG tablet   Oral   Take 1 tablet (50 mg total) by mouth every 6 (six) hours as needed.   20 tablet   0   . ziprasidone (GEODON) 60 MG capsule   Oral   Take 60 mg by mouth at bedtime.           Allergies Aspirin; Ciprofloxacin hcl; Ibuprofen; Metronidazole; Naproxen; Oxycodone; Tetracyclines & related; Ciprofloxacin; Nitrofurantoin; Sulfa antibiotics; and Tetracycline  Family History  Problem Relation Age of Onset  . Heart disease Father   . Heart disease Mother   . Lymphoma Father     Social History Social History  Substance Use Topics  . Smoking status: Former Smoker -- 5.00 packs/day for 40 years    Types: Cigarettes    Quit date: 09/24/2007  . Smokeless tobacco: None  . Alcohol Use: 0.0 oz/week    0 Standard drinks or equivalent per week     Comment: daily, beer and hard liquor     Review of Systems  Constitutional: No fever/chills Eyes: Reports blurry vision. Cardiovascular: no chest pain. Respiratory: no cough. No SOB. Gastrointestinal:   No nausea, no vomiting.   Musculoskeletal: Negative for back pain. Positive for facial and nose pain. Positive for right hand pain. Positive for right knee pain. Skin: Negative for rash. Positive for laceration to the bridge of the nose. Neurological: Negative for headaches, focal weakness or numbness. 10-point ROS otherwise  negative.  ____________________________________________   PHYSICAL EXAM:  VITAL SIGNS: ED Triage Vitals  Enc Vitals Group     BP 07/11/15 1530 119/69 mmHg     Pulse Rate 07/11/15 1530 69     Resp 07/11/15 1530 18     Temp 07/11/15 1530 97.8 F (36.6 C)     Temp Source 07/11/15 1530 Oral     SpO2 07/11/15 1530 98 %     Weight 07/11/15 1530 193 lb (87.544 kg)     Height 07/11/15 1530  (1.651 m)     Head Cir --      Peak Flow --      Pain Score 07/11/15 1531 4     Pain Loc --      Pain Edu? --      Excl. in GC? --      Constitutional: Alert and oriented. Well appearing and in no acute distress. Eyes: Conjunctivae are normal. PERRL. EOMI. Head: Swelling and laceration noted to nose. No other ecchymosis, contusion, abrasion, laceration noted. Patient is nontender to palpation over the scalp. No crepitus is noted. Patient is tender to palpation over the bridge of  the nose and bilateral zygomatic arches. No crepitus noted. No palpable abnormality. ENT:      Ears:       Nose: No congestion/rhinnorhea. No epistaxis.      Mouth/Throat: Mucous membranes are moist.  Neck: No stridor.  No cervical spine tenderness to palpation. Cardiovascular: Normal rate, regular rhythm. Normal S1 and S2.  Good peripheral circulation. Respiratory: Mildly increased work of breathing with mild tachypnea but no retractions or other use of the sensory muscles. Lungs with scattered expiratory wheezing. No rales or rhonchi. No decreased rest of breath sounds. Musculoskeletal: Abrasions noted to palmar and dorsal aspect of right hand. No edema or deformity noted. Full range of motion to wrist and all digits. Radial pulses appreciated. Sensation intact 5 digits. Edema and abrasion noted to right knee when compared to left. Abrasion is on the anterior aspect. Full range of motion to knee. Patient is diffusely tender palpation over the anterior aspect with no point tenderness. Varus, valgus, Lachman's are  negative. Neurologic:  Normal speech and language. No gross focal neurologic deficits are appreciated. Cranial nerves II through XII are tested here in the emergency department. Patient is sluggish to respond to provide appropriately following commands. Skin:  Skin is warm, dry and intact. No rash noted. Abrasions to right hand and right knee. Small superficial laceration to the bridge of the nose. Length is approximately 1 cm. Psychiatric: Mood and affect are normal. Speech and behavior are normal. Patient exhibits appropriate insight and judgement.   ____________________________________________   LABS (all labs ordered are listed, but only abnormal results are displayed)  Labs Reviewed - No data to display ____________________________________________  EKG   ____________________________________________  RADIOLOGY Festus Barren Alaia Lordi, personally viewed and evaluated these images (plain radiographs) as part of my medical decision making, as well as reviewing the written report by the radiologist.  Dg Chest 2 View  07/11/2015  CLINICAL DATA:  patient was getting out of the car and fell and hit face on concrete. Patient c/o right hand pain, right knee pain and nose pain. Patient has laceration across bridge of nose, abrasions on right hand and right knee. EXAM: CHEST - 2 VIEW COMPARISON:  06/27/2015 FINDINGS: Stable left subclavian AICD. Relatively low lung volumes with crowding of perihilar and bibasilar bronchovascular structures. Heart size upper limits normal. Atheromatous aorta. No effusion.  No pneumothorax. Spurring in the lower thoracic spine. IMPRESSION: Stable chronic and postoperative changes.  No acute abnormality. Electronically Signed   By: Corlis Leak M.D.   On: 07/11/2015 17:18   Ct Head Wo Contrast  07/11/2015  CLINICAL DATA:  Fall from car, facial pain, facial lacerations. EXAM: CT HEAD WITHOUT CONTRAST CT MAXILLOFACIAL WITHOUT CONTRAST CT CERVICAL SPINE WITHOUT CONTRAST  TECHNIQUE: Multidetector CT imaging of the head, cervical spine, and maxillofacial structures were performed using the standard protocol without intravenous contrast. Multiplanar CT image reconstructions of the cervical spine and maxillofacial structures were also generated. COMPARISON:  Head CT dated 08/21/2013. FINDINGS: CT HEAD FINDINGS There is mild generalized brain atrophy with commensurate dilatation of the ventricles and sulci. Mild chronic small vessel ischemic change noted within the bilateral periventricular and subcortical white matter. There is no mass, hemorrhage, edema or other evidence of acute parenchymal abnormality. No extra-axial hemorrhage. No osseous fracture or dislocation seen. Superficial soft tissues are unremarkable. CT MAXILLOFACIAL FINDINGS Lower frontal bones are intact. No displaced nasal bone fracture seen. Osseous structures about the orbits are intact and well aligned bilaterally. Bilateral zygoma and pterygoid plates  are intact. No mandible fracture seen. Right mandibular condyles is displaced anteriorly - inferiorly from the condylar fossa but this may be chronic. Superficial soft tissues overlying the facial bones are unremarkable. Atherosclerotic calcifications noted at each carotid bulb region CT CERVICAL SPINE FINDINGS Extensive degenerative changes noted throughout the cervical spine with associated disc space narrowings and osseous spurring. Disc-osteophytic bulges are seen at the C4-5 through C7-T1 levels causing moderate central canal stenoses with probable associated nerve root impingement at multiple levels. There is reversal of the normal cervical lordosis likely related to the underlying degenerative changes. There is no fracture line or displaced fracture fragment identified. Facet joints are well aligned throughout. Atherosclerotic calcifications noted at each carotid bulb region. Paravertebral soft tissues otherwise unremarkable. IMPRESSION: 1. Negative head CT. No  intracranial mass, hemorrhage or edema. No skull fracture. Atrophy and chronic ischemic changes in the white matter. 2. Right mandibular condyle displaced anteriorly-inferiorly from the condylar fossa but this may be chronic. Any acute pain at the right TMJ? No other osseous displacement seen. No facial bone fracture seen. 3. No acute fracture or subluxation within the cervical spine. Fairly extensive degenerative change throughout the cervical spine, as detailed above. Electronically Signed   By: Bary RichardStan  Maynard M.D.   On: 07/11/2015 18:30   Ct Cervical Spine Wo Contrast  07/11/2015  CLINICAL DATA:  Fall from car, facial pain, facial lacerations. EXAM: CT HEAD WITHOUT CONTRAST CT MAXILLOFACIAL WITHOUT CONTRAST CT CERVICAL SPINE WITHOUT CONTRAST TECHNIQUE: Multidetector CT imaging of the head, cervical spine, and maxillofacial structures were performed using the standard protocol without intravenous contrast. Multiplanar CT image reconstructions of the cervical spine and maxillofacial structures were also generated. COMPARISON:  Head CT dated 08/21/2013. FINDINGS: CT HEAD FINDINGS There is mild generalized brain atrophy with commensurate dilatation of the ventricles and sulci. Mild chronic small vessel ischemic change noted within the bilateral periventricular and subcortical white matter. There is no mass, hemorrhage, edema or other evidence of acute parenchymal abnormality. No extra-axial hemorrhage. No osseous fracture or dislocation seen. Superficial soft tissues are unremarkable. CT MAXILLOFACIAL FINDINGS Lower frontal bones are intact. No displaced nasal bone fracture seen. Osseous structures about the orbits are intact and well aligned bilaterally. Bilateral zygoma and pterygoid plates are intact. No mandible fracture seen. Right mandibular condyles is displaced anteriorly - inferiorly from the condylar fossa but this may be chronic. Superficial soft tissues overlying the facial bones are unremarkable.  Atherosclerotic calcifications noted at each carotid bulb region CT CERVICAL SPINE FINDINGS Extensive degenerative changes noted throughout the cervical spine with associated disc space narrowings and osseous spurring. Disc-osteophytic bulges are seen at the C4-5 through C7-T1 levels causing moderate central canal stenoses with probable associated nerve root impingement at multiple levels. There is reversal of the normal cervical lordosis likely related to the underlying degenerative changes. There is no fracture line or displaced fracture fragment identified. Facet joints are well aligned throughout. Atherosclerotic calcifications noted at each carotid bulb region. Paravertebral soft tissues otherwise unremarkable. IMPRESSION: 1. Negative head CT. No intracranial mass, hemorrhage or edema. No skull fracture. Atrophy and chronic ischemic changes in the white matter. 2. Right mandibular condyle displaced anteriorly-inferiorly from the condylar fossa but this may be chronic. Any acute pain at the right TMJ? No other osseous displacement seen. No facial bone fracture seen. 3. No acute fracture or subluxation within the cervical spine. Fairly extensive degenerative change throughout the cervical spine, as detailed above. Electronically Signed   By: Anne NgStan  Maynard M.D.  On: 07/11/2015 18:30   Dg Knee Complete 4 Views Right  07/11/2015  CLINICAL DATA:  patient was getting out of the car and fell and hit face on concrete. Patient c/o right hand pain, right knee pain and nose pain. Patient has laceration across bridge of nose, abrasions on right hand and right knee. * EXAM: RIGHT KNEE - COMPLETE 4+ VIEW COMPARISON:  08/26/2012 FINDINGS: There is no evidence of acute fracture, dislocation, or joint effusion. Fracture lines from previous patellar fracture are much less distinct, without displacement. Chondrocalcinosis in medial and lateral compartments. There is no evidence of arthropathy or other focal bone abnormality.  Soft tissues are unremarkable. Patchy femoral-popliteal arterial calcifications. IMPRESSION: 1. Negative for  acute fracture or dislocation. 2. Chondrocalcinosis suggesting CPPD. Electronically Signed   By: Corlis Leak M.D.   On: 07/11/2015 17:16   Dg Hand Complete Right  07/11/2015  CLINICAL DATA:  patient was getting out of the car and fell and hit face on concrete. Patient c/o right hand pain, right knee pain and nose pain. Patient has laceration across bridge of nose, abrasions on right hand and right knee. EXAM: RIGHT HAND - COMPLETE 3+ VIEW COMPARISON:  None. FINDINGS: There is no evidence of fracture or dislocation. Old fracture deformity of the fifth metacarpal. There is no evidence of arthropathy or other focal bone abnormality. Soft tissues are unremarkable. No radiodense foreign body. IMPRESSION: Negative. Electronically Signed   By: Corlis Leak M.D.   On: 07/11/2015 17:15   Ct Maxillofacial Wo Cm  07/11/2015  CLINICAL DATA:  Fall from car, facial pain, facial lacerations. EXAM: CT HEAD WITHOUT CONTRAST CT MAXILLOFACIAL WITHOUT CONTRAST CT CERVICAL SPINE WITHOUT CONTRAST TECHNIQUE: Multidetector CT imaging of the head, cervical spine, and maxillofacial structures were performed using the standard protocol without intravenous contrast. Multiplanar CT image reconstructions of the cervical spine and maxillofacial structures were also generated. COMPARISON:  Head CT dated 08/21/2013. FINDINGS: CT HEAD FINDINGS There is mild generalized brain atrophy with commensurate dilatation of the ventricles and sulci. Mild chronic small vessel ischemic change noted within the bilateral periventricular and subcortical white matter. There is no mass, hemorrhage, edema or other evidence of acute parenchymal abnormality. No extra-axial hemorrhage. No osseous fracture or dislocation seen. Superficial soft tissues are unremarkable. CT MAXILLOFACIAL FINDINGS Lower frontal bones are intact. No displaced nasal bone fracture  seen. Osseous structures about the orbits are intact and well aligned bilaterally. Bilateral zygoma and pterygoid plates are intact. No mandible fracture seen. Right mandibular condyles is displaced anteriorly - inferiorly from the condylar fossa but this may be chronic. Superficial soft tissues overlying the facial bones are unremarkable. Atherosclerotic calcifications noted at each carotid bulb region CT CERVICAL SPINE FINDINGS Extensive degenerative changes noted throughout the cervical spine with associated disc space narrowings and osseous spurring. Disc-osteophytic bulges are seen at the C4-5 through C7-T1 levels causing moderate central canal stenoses with probable associated nerve root impingement at multiple levels. There is reversal of the normal cervical lordosis likely related to the underlying degenerative changes. There is no fracture line or displaced fracture fragment identified. Facet joints are well aligned throughout. Atherosclerotic calcifications noted at each carotid bulb region. Paravertebral soft tissues otherwise unremarkable. IMPRESSION: 1. Negative head CT. No intracranial mass, hemorrhage or edema. No skull fracture. Atrophy and chronic ischemic changes in the white matter. 2. Right mandibular condyle displaced anteriorly-inferiorly from the condylar fossa but this may be chronic. Any acute pain at the right TMJ? No other osseous displacement seen. No  facial bone fracture seen. 3. No acute fracture or subluxation within the cervical spine. Fairly extensive degenerative change throughout the cervical spine, as detailed above. Electronically Signed   By: Bary Richard M.D.   On: 07/11/2015 18:30    ____________________________________________    PROCEDURES  Procedure(s) performed:   LACERATION REPAIR Performed by: Racheal Patches Authorized by: Delorise Royals Oceanna Arruda Consent: Verbal consent obtained. Risks and benefits: risks, benefits and alternatives were  discussed Consent given by: patient Patient identity confirmed: provided demographic data Prepped and Draped in normal sterile fashion Wound explored  Laceration Location: Nasal bridge  Laceration Length: 1 cm  No Foreign Bodies seen or palpated   Irrigation method: syringe Amount of cleaning: standard  Skin closure: Skin adhesive    Patient tolerance: Patient tolerated the procedure well with no immediate complications.   The wound (superficial abrasions to the right hand and right knee) were cleansed, debrided of foreign material as much as possible, and dressed. The patient is alerted to watch for any signs of infection (redness, pus, pain, increased swelling or fever) and call if such occurs. Home wound care instructions are provided.    Medications  albuterol (PROVENTIL) (2.5 MG/3ML) 0.083% nebulizer solution 2.5 mg (2.5 mg Nebulization Given 07/11/15 1726)  methylPREDNISolone sodium succinate (SOLU-MEDROL) 125 mg/2 mL injection 125 mg (125 mg Intramuscular Given 07/11/15 1733)     ____________________________________________   INITIAL IMPRESSION / ASSESSMENT AND PLAN / ED COURSE  Pertinent labs & imaging results that were available during my care of the patient were reviewed by me and considered in my medical decision making (see chart for details).   ----------------------------------------- 5:17 PM on 07/11/2015 -----------------------------------------  Patient went to CT scan for his CT of his head, face, C-spine. Patient was unable to lay flat due to COPD. Upon further questioning patient states that he is on 3 L/m oxygen therapy at home. Patient doesn't endorse pain very short of breath while laying down. Patient had scattered expiratory wheezing to auscultation. Even though this is likely patient's baseline, patient will be given albuterol, Solu-Medrol, and oxygen therapy. Patient will be reevaluated once these treatments have been provided to see the patient is  able to tolerate laying flat for CT scans.   ----------------------------------------- 7:38 PM on 07/11/2015 -----------------------------------------  Patient was able to lie flat after using supple oxygen and albuterol treatment. There is still scattered expiratory wheezing bilaterally. This is somewhat improved. Patient also has a decreased effort of breathing. With this improvement CT scans were able to be performed. These resulted without acute abnormality.     Patient's diagnosis is consistent with fall with laceration to the nasal bridge. Patient endorsed consuming alcohol as well as mechanical fall. CT scans and x-rays were performed. These resulted without any acute abnormality. As such, patient will be discharged home. Nasal laceration is treated as described above. Patient will be discharged home with prescriptions for pain medication. Patient does have a history of chronic kidney disease and as such anti-inflammatories are not prescribed. Patient is given strict precautions using a medication to prevent a recurrence of a fall.. Patient is to follow up with primary care provider if symptoms persist past this treatment course. Patient is given ED precautions to return to the ED for any worsening or new symptoms.     ____________________________________________  FINAL CLINICAL IMPRESSION(S) / ED DIAGNOSES  Final diagnoses:  Fall, initial encounter  Nasal laceration, initial encounter  Facial contusion, initial encounter  Hand contusion, right, initial encounter  Hand  abrasion, right, initial encounter  Knee contusion, right, initial encounter  Knee abrasion, right, initial encounter      NEW MEDICATIONS STARTED DURING THIS VISIT:  New Prescriptions   TRAMADOL (ULTRAM) 50 MG TABLET    Take 1 tablet (50 mg total) by mouth every 6 (six) hours as needed.        This chart was dictated using voice recognition software/Dragon. Despite best efforts to proofread, errors  can occur which can change the meaning. Any change was purely unintentional.    Racheal Patches, PA-C 07/11/15 1946  Arnaldo Natal, MD 07/12/15 [phone]

## 2015-07-11 NOTE — ED Notes (Signed)
Patient has audible wheezing after neb treatment.

## 2015-07-11 NOTE — ED Notes (Signed)
Wrapped pt's hand/wrist. Discussed wound care/dressing changes. Reviewed d/c instructions, prescriptions, use of ice/elevation with pt. Pt verbalized understanding.

## 2015-07-11 NOTE — ED Notes (Signed)
Per EMS report, patient was getting out of the car and fell and hit face on concrete. Patient c/o right hand pain, right knee pain and nose pain. Patient has laceration across bridge of nose, abrasions on right hand and right knee. Bleeding is controlled upon arrival. Patient admitted to drinking 4, 5, 6 beers prior to fall.

## 2015-07-12 ENCOUNTER — Other Ambulatory Visit: Payer: Self-pay | Admitting: Urology

## 2015-07-12 LAB — CULTURE, URINE COMPREHENSIVE

## 2015-07-15 ENCOUNTER — Telehealth: Payer: Self-pay

## 2015-07-15 NOTE — Telephone Encounter (Signed)
-----   Message from Crist FatBenjamin W Herrick, MD sent at 07/12/2015  6:38 PM EST ----- Regarding: urine culture Culture is positive - sensitive to augmentin.  ----- Message -----    From: Skeet Latchhelsea C Popov, LPN    Sent: 4/09/81193/02/2016  10:33 AM      To: Crist FatBenjamin W Herrick, MD    ----- Message -----    From: [facility] Lab Results In Interface    Sent: 07/09/2015   1:39 PM      To: [facility]

## 2015-07-15 NOTE — Telephone Encounter (Signed)
No we gave him augmentin.

## 2015-07-15 NOTE — Telephone Encounter (Signed)
Spoke with pt and made aware of +ucx. Pt stated he is feeling better and voiced understanding.

## 2015-07-15 NOTE — Telephone Encounter (Signed)
-----   Message from Harle BattiestShannon A McGowan, PA-C sent at 07/14/2015  8:51 PM EDT ----- Dr. Jasmine AweHerrick's last note stated the patient was started on Unasyn.  I believe this is an IV medication.  Is the patient receiving the Unasyn?

## 2015-07-15 NOTE — Telephone Encounter (Signed)
Did he complete the Augmentin?  If so, he will need a repeated UA to ensure the infection has cleared.

## 2015-07-16 ENCOUNTER — Ambulatory Visit (INDEPENDENT_AMBULATORY_CARE_PROVIDER_SITE_OTHER): Payer: Medicare PPO | Admitting: Urology

## 2015-07-16 ENCOUNTER — Encounter: Payer: Self-pay | Admitting: Urology

## 2015-07-16 VITALS — BP 121/68 | HR 83 | Ht 65.0 in | Wt 189.0 lb

## 2015-07-16 DIAGNOSIS — N401 Enlarged prostate with lower urinary tract symptoms: Secondary | ICD-10-CM

## 2015-07-16 DIAGNOSIS — N138 Other obstructive and reflux uropathy: Secondary | ICD-10-CM

## 2015-07-16 LAB — URINALYSIS, COMPLETE
Bilirubin, UA: NEGATIVE
GLUCOSE, UA: NEGATIVE
Ketones, UA: NEGATIVE
Leukocytes, UA: NEGATIVE
Nitrite, UA: NEGATIVE
PROTEIN UA: NEGATIVE
RBC, UA: NEGATIVE
Specific Gravity, UA: 1.01 (ref 1.005–1.030)
Urobilinogen, Ur: 0.2 mg/dL (ref 0.2–1.0)
pH, UA: 7 (ref 5.0–7.5)

## 2015-07-16 LAB — MICROSCOPIC EXAMINATION: BACTERIA UA: NONE SEEN

## 2015-07-16 LAB — BLADDER SCAN AMB NON-IMAGING

## 2015-07-16 NOTE — Progress Notes (Signed)
07/16/2015 8:56 AM   Robert Ball April 14, 1946 [id number]  Referring provider: Mickey Farber, MD [address] [facility] [name], [place] [address]  Chief Complaint  Patient presents with  . Benign Prostatic Hypertrophy    1wk    HPI: Robert Ball is a 37 here for followup for for BPH with LUTS. PVR is 131. His urgency and frequency has improved. Nocturia is 2x.  He is on BID flomax. He is not on finasteride. He has had an issues with urinary retention.  He is here to discuss Urolift    PMH: Past Medical History  Diagnosis Date  . PTSD (post-traumatic stress disorder)   . Hypertension   . Anxiety   . Depression   . Prostate enlargement   . GERD (gastroesophageal reflux disease)   . Headache   . Elevated lipids   . Shakes   . COPD (chronic obstructive pulmonary disease) (HCC)     On 2liter oxygen  . Cardiac defibrillator in place     Surgical History: Past Surgical History  Procedure Laterality Date  . Neck surgery    . Hernia repair    . Hemorrhoid surgery    . Cataract extraction w/ intraocular lens  implant, bilateral Bilateral   . Eye muscle surgery Bilateral   . Icd lead removal Left 12/27/2014    Procedure: ICD LEAD REMOVAL/ICD GENERATOR CHANGE OUT;  Surgeon: Marcina Millard, MD;  Location: [facility] ORS;  Service: Cardiovascular;  Laterality: Left;  . Back surgery      Home Medications:    Medication List       This list is accurate as of: 07/16/15  8:56 AM.  Always use your most recent med list.               acetaminophen 500 MG tablet  Commonly known as:  TYLENOL  Take 500 mg by mouth every 6 (six) hours as needed for mild pain or headache.     albuterol (2.5 MG/3ML) 0.083% nebulizer solution  Commonly known as:  PROVENTIL  Take 2.5 mg by nebulization every 6 (six) hours as needed for wheezing or shortness of breath.     albuterol 108 (90 Base) MCG/ACT inhaler  Commonly known as:  PROVENTIL HFA;VENTOLIN HFA  Inhale 2 puffs  into the lungs every 6 (six) hours as needed for wheezing or shortness of breath.     amLODipine 10 MG tablet  Commonly known as:  NORVASC  Take 10 mg by mouth daily.     amoxicillin-clavulanate 875-125 MG tablet  Commonly known as:  AUGMENTIN  Take 1 tablet by mouth 2 (two) times daily.     carvedilol 12.5 MG tablet  Commonly known as:  COREG  Take 12.5 mg by mouth 2 (two) times daily.     cetirizine 10 MG tablet  Commonly known as:  ZYRTEC  Take by mouth.     fenofibrate 160 MG tablet  Take 160 mg by mouth daily.     furosemide 40 MG tablet  Commonly known as:  LASIX  Take 40 mg by mouth daily.     loperamide 2 MG capsule  Commonly known as:  IMODIUM  Take 4 mg by mouth daily.     omeprazole 20 MG capsule  Commonly known as:  PRILOSEC  Take 20 mg by mouth 2 (two) times daily.     prochlorperazine 5 MG tablet  Commonly known as:  COMPAZINE  Take 5 mg by mouth 3 (three) times daily  as needed for nausea or vomiting.     roflumilast 500 MCG Tabs tablet  Commonly known as:  DALIRESP  Take 500 mcg by mouth daily.     sertraline 100 MG tablet  Commonly known as:  ZOLOFT  Take 100 mg by mouth 2 (two) times daily.     SYMBICORT 160-4.5 MCG/ACT inhaler  Generic drug:  budesonide-formoterol  Inhale into the lungs.     tamsulosin 0.4 MG Caps capsule  Commonly known as:  FLOMAX  Take 1 capsule (0.4 mg total) by mouth daily.     tiotropium 18 MCG inhalation capsule  Commonly known as:  SPIRIVA  Place 18 mcg into inhaler and inhale daily.     traMADol 50 MG tablet  Commonly known as:  ULTRAM  Take 1 tablet (50 mg total) by mouth every 6 (six) hours as needed.     Vitamin D3 5000 units Caps  Take 5,000 Units by mouth every other day. Reported on 06/18/2015     ziprasidone 60 MG capsule  Commonly known as:  GEODON  Take 60 mg by mouth at bedtime.        Allergies:  Allergies  Allergen Reactions  . Aspirin Other (See Comments) and Itching    Pt is unable to  take this medication.    . Ciprofloxacin Hcl Other (See Comments)    Reaction:  Unknown   . Ibuprofen Other (See Comments) and Itching    Pt is unable to take this medication.    . Metronidazole Other (See Comments)    Reaction:  Unknown   . Naproxen Other (See Comments)    Pt is unable to take this medication.    . Oxycodone Itching  . Tetracyclines & Related Other (See Comments)    Reaction:  Unknown   . Ciprofloxacin Rash  . Nitrofurantoin Rash  . Sulfa Antibiotics Rash  . Tetracycline Rash    Family History: Family History  Problem Relation Age of Onset  . Heart disease Father   . Heart disease Mother   . Lymphoma Father     Social History:  reports that he quit smoking about 7 years ago. His smoking use included Cigarettes. He has a 200 pack-year smoking history. He does not have any smokeless tobacco history on file. He reports that he drinks alcohol. He reports that he does not use illicit drugs.  ROS: UROLOGY Frequent Urination?: No Hard to postpone urination?: Yes Burning/pain with urination?: No Get up at night to urinate?: Yes Leakage of urine?: No Urine stream starts and stops?: No Trouble starting stream?: No Do you have to strain to urinate?: No Blood in urine?: No Urinary tract infection?: No Sexually transmitted disease?: No Injury to kidneys or bladder?: No Painful intercourse?: No Weak stream?: No Erection problems?: No Penile pain?: No  Gastrointestinal Nausea?: No Vomiting?: No Indigestion/heartburn?: No Diarrhea?: No Constipation?: No  Constitutional Fever: No Night sweats?: No Weight loss?: No Fatigue?: No  Skin Skin rash/lesions?: No Itching?: No  Eyes Blurred vision?: No Double vision?: No  Ears/Nose/Throat Sore throat?: No Sinus problems?: No  Hematologic/Lymphatic Swollen glands?: No Easy bruising?: No  Cardiovascular Leg swelling?: No Chest pain?: No  Respiratory Cough?: No Shortness of breath?:  No  Endocrine Excessive thirst?: No  Musculoskeletal Back pain?: No Joint pain?: No  Neurological Headaches?: No Dizziness?: No  Psychologic Depression?: No Anxiety?: No  Physical Exam: BP 121/68 mmHg  Pulse 83  Ht 5\' 5"  (1.651 m)  Wt 85.73 kg (189 lb)  BMI 31.45 kg/m2  Constitutional:  Alert and oriented, No acute distress. HEENT:  AT, moist mucus membranes.  Trachea midline, no masses. Cardiovascular: No clubbing, cyanosis, or edema. Respiratory: Normal respiratory effort, no increased work of breathing. GI: Abdomen is soft, nontender, nondistended, no abdominal masses GU: No CVA tenderness.  Skin: No rashes, bruises or suspicious lesions. Lymph: No cervical or inguinal adenopathy. Neurologic: Grossly intact, no focal deficits, moving all 4 extremities. Psychiatric: Normal mood and affect.  Laboratory Data: Lab Results  Component Value Date   WBC 7.9 06/27/2015   HGB 14.5 06/27/2015   HCT 41.6 06/27/2015   MCV 89.7 06/27/2015   PLT 163 06/27/2015    Lab Results  Component Value Date   CREATININE 1.44* 06/27/2015    No results found for: PSA  No results found for: TESTOSTERONE  No results found for: HGBA1C  Urinalysis    Component Value Date/Time   COLORURINE YELLOW* 06/27/2015 1312   COLORURINE Yellow 04/19/2012 0025   APPEARANCEUR CLEAR* 06/27/2015 1312   APPEARANCEUR Clear 04/19/2012 0025   LABSPEC 1.011 06/27/2015 1312   LABSPEC 1.020 04/19/2012 0025   PHURINE 7.0 06/27/2015 1312   PHURINE 5.0 04/19/2012 0025   GLUCOSEU Negative 07/09/2015 0850   GLUCOSEU 50 mg/dL 29/52/8413 2440   HGBUR NEGATIVE 06/27/2015 1312   HGBUR Negative 04/19/2012 0025   BILIRUBINUR Negative 07/09/2015 0850   BILIRUBINUR NEGATIVE 06/27/2015 1312   BILIRUBINUR Negative 04/19/2012 0025   KETONESUR NEGATIVE 06/27/2015 1312   KETONESUR Negative 04/19/2012 0025   PROTEINUR NEGATIVE 06/27/2015 1312   PROTEINUR Negative 04/19/2012 0025   NITRITE Negative 07/09/2015  0850   NITRITE NEGATIVE 06/27/2015 1312   NITRITE Negative 04/19/2012 0025   LEUKOCYTESUR 1+* 07/09/2015 0850   LEUKOCYTESUR NEGATIVE 06/27/2015 1312   LEUKOCYTESUR Trace 04/19/2012 0025    Pertinent Imaging: none  Assessment & Plan:    1. Benign prostatic hyperplasia with urinary obstruction -flomax 0.4mg  BID - Urinalysis, Complete - BLADDER SCAN AMB NON-IMAGING We discussed Urolift and the patient defers at this time   No Follow-up on file.  Malen Gauze, MD  [facility] [address] [place], [place] [address] [phone]

## 2015-07-17 ENCOUNTER — Ambulatory Visit: Payer: Medicare PPO | Attending: Anesthesiology | Admitting: Anesthesiology

## 2015-07-17 ENCOUNTER — Encounter: Payer: Self-pay | Admitting: Anesthesiology

## 2015-07-17 VITALS — BP 135/82 | HR 86 | Temp 97.9°F | Resp 24 | Ht 65.0 in | Wt 180.0 lb

## 2015-07-17 DIAGNOSIS — M51369 Other intervertebral disc degeneration, lumbar region without mention of lumbar back pain or lower extremity pain: Secondary | ICD-10-CM

## 2015-07-17 DIAGNOSIS — Z95810 Presence of automatic (implantable) cardiac defibrillator: Secondary | ICD-10-CM | POA: Insufficient documentation

## 2015-07-17 DIAGNOSIS — G8929 Other chronic pain: Secondary | ICD-10-CM | POA: Diagnosis not present

## 2015-07-17 DIAGNOSIS — N400 Enlarged prostate without lower urinary tract symptoms: Secondary | ICD-10-CM | POA: Diagnosis not present

## 2015-07-17 DIAGNOSIS — K219 Gastro-esophageal reflux disease without esophagitis: Secondary | ICD-10-CM | POA: Insufficient documentation

## 2015-07-17 DIAGNOSIS — F329 Major depressive disorder, single episode, unspecified: Secondary | ICD-10-CM | POA: Diagnosis not present

## 2015-07-17 DIAGNOSIS — Z87891 Personal history of nicotine dependence: Secondary | ICD-10-CM | POA: Insufficient documentation

## 2015-07-17 DIAGNOSIS — F4310 Post-traumatic stress disorder, unspecified: Secondary | ICD-10-CM | POA: Insufficient documentation

## 2015-07-17 DIAGNOSIS — R0781 Pleurodynia: Secondary | ICD-10-CM | POA: Diagnosis not present

## 2015-07-17 DIAGNOSIS — R0789 Other chest pain: Secondary | ICD-10-CM

## 2015-07-17 DIAGNOSIS — M5134 Other intervertebral disc degeneration, thoracic region: Secondary | ICD-10-CM

## 2015-07-17 DIAGNOSIS — J449 Chronic obstructive pulmonary disease, unspecified: Secondary | ICD-10-CM | POA: Diagnosis not present

## 2015-07-17 DIAGNOSIS — F419 Anxiety disorder, unspecified: Secondary | ICD-10-CM | POA: Insufficient documentation

## 2015-07-17 DIAGNOSIS — M5136 Other intervertebral disc degeneration, lumbar region: Secondary | ICD-10-CM

## 2015-07-17 DIAGNOSIS — M546 Pain in thoracic spine: Secondary | ICD-10-CM | POA: Diagnosis not present

## 2015-07-17 DIAGNOSIS — M549 Dorsalgia, unspecified: Secondary | ICD-10-CM | POA: Diagnosis present

## 2015-07-17 DIAGNOSIS — I10 Essential (primary) hypertension: Secondary | ICD-10-CM | POA: Diagnosis not present

## 2015-07-17 DIAGNOSIS — R1011 Right upper quadrant pain: Secondary | ICD-10-CM | POA: Insufficient documentation

## 2015-07-17 MED ORDER — ROPIVACAINE HCL 2 MG/ML IJ SOLN
INTRAMUSCULAR | Status: AC
  Filled 2015-07-17: qty 10

## 2015-07-17 MED ORDER — TRAMADOL HCL 50 MG PO TABS: 50.0000 mg | ORAL_TABLET | Freq: Four times a day (QID) | ORAL | Status: DC | PRN

## 2015-07-17 MED ORDER — DEXAMETHASONE SODIUM PHOSPHATE 10 MG/ML IJ SOLN: 10.0000 mg | Freq: Once | INTRAMUSCULAR | Status: DC

## 2015-07-17 MED ORDER — DEXAMETHASONE SODIUM PHOSPHATE 10 MG/ML IJ SOLN
INTRAMUSCULAR | Status: AC
  Filled 2015-07-17: qty 1

## 2015-07-17 MED ORDER — ROPIVACAINE HCL 2 MG/ML IJ SOLN: 10.0000 mL | Freq: Once | INTRAMUSCULAR | Status: DC

## 2015-07-17 NOTE — Progress Notes (Signed)
Patient here for evaluation after procedure.  Patient had a fall after having his flomax increased and PCP has encouraged patient to rise slowly and be careful upon rising until gaining balance  Safety precautions to be maintained throughout the outpatient stay will include: orient to surroundings, keep bed in low position, maintain call bell within reach at all times, provide assistance with transfer out of bed and ambulation.

## 2015-07-17 NOTE — Progress Notes (Signed)
Subjective:  Patient ID: Robert Ball, male    DOB: October 20, 1945  Age: 70 y.o. MRN: [id number]  CC: Back Pain  procedure: Right posterior shoulder trigger point injection 2  #2  HPI [name] presents for right side upper abdominal pain and posterior back pain. He is followed by Dr. Harrington Challenger and has been referred for evaluation management. He describes an area over the right anterior abdominal region that has caused him considerable deep-seated pain for the past 2-3 months. He's gone through an extensive workup for this including abdominal CT scan chest x-rays in an effort to find an underlying source. He denies any significant improvement with any medication management for physical therapy, stretching strengthening exercises since his last evaluation and desires to proceed with a repeat trigger point injection today. His last injection failed to give him any significant improvement but he desires to try one more time today. I think this would be reasonable based on his underlying risk stratification for other therapeutic intervention.   He has a very complicated history of severe COPD and he is on oxygen at present and uses this at night as well. He takes continuous steroids for his COPD and uses oxycodone for chronic pain. He also takes Flexeril and lorazepam.  History Delta has a past medical history of PTSD (post-traumatic stress disorder); Hypertension; Anxiety; Depression; Prostate enlargement; GERD (gastroesophageal reflux disease); Headache; Elevated lipids; Shakes; COPD (chronic obstructive pulmonary disease) (HCC); and Cardiac defibrillator in place.   He has past surgical history that includes Neck surgery; Hernia repair; Hemorrhoid surgery; Cataract extraction w/ intraocular lens  implant, bilateral (Bilateral); Eye muscle surgery (Bilateral); Icd lead removal (Left, 12/27/2014); and Back surgery.   His family history includes Heart disease in his father and mother; Lymphoma in his  father.He reports that he quit smoking about 7 years ago. His smoking use included Cigarettes. He has a 200 pack-year smoking history. He does not have any smokeless tobacco history on file. He reports that he drinks alcohol. He reports that he does not use illicit drugs.   ---------------------------------------------------------------------------------------------------------------------- Past Medical History  Diagnosis Date  . PTSD (post-traumatic stress disorder)   . Hypertension   . Anxiety   . Depression   . Prostate enlargement   . GERD (gastroesophageal reflux disease)   . Headache   . Elevated lipids   . Shakes   . COPD (chronic obstructive pulmonary disease) (HCC)     On 2liter oxygen  . Cardiac defibrillator in place     Past Surgical History  Procedure Laterality Date  . Neck surgery    . Hernia repair    . Hemorrhoid surgery    . Cataract extraction w/ intraocular lens  implant, bilateral Bilateral   . Eye muscle surgery Bilateral   . Icd lead removal Left 12/27/2014    Procedure: ICD LEAD REMOVAL/ICD GENERATOR CHANGE OUT;  Surgeon: Marcina Millard, MD;  Location: [facility] ORS;  Service: Cardiovascular;  Laterality: Left;  . Back surgery      Family History  Problem Relation Age of Onset  . Heart disease Father   . Heart disease Mother   . Lymphoma Father     Social History  Substance Use Topics  . Smoking status: Former Smoker -- 5.00 packs/day for 40 years    Types: Cigarettes    Quit date: 09/24/2007  . Smokeless tobacco: Not on file  . Alcohol Use: 0.0 oz/week    0 Standard drinks or equivalent per week  Comment: daily, beer and hard liquor    ---------------------------------------------------------------------------------------------------------------------- Social History   Social History  . Marital Status: Married    Spouse Name: N/A  . Number of Children: N/A  . Years of Education: N/A   Social History Main Topics  . Smoking status:  Former Smoker -- 5.00 packs/day for 40 years    Types: Cigarettes    Quit date: 09/24/2007  . Smokeless tobacco: None  . Alcohol Use: 0.0 oz/week    0 Standard drinks or equivalent per week     Comment: daily, beer and hard liquor  . Drug Use: No  . Sexual Activity: Not Asked   Other Topics Concern  . None   Social History Narrative      ----------------------------------------------------------------------------------------------------------------------  ROS Review of Systems  Without change  Objective:  BP 135/82 mmHg  Pulse 86  Temp(Src) 97.9 F (36.6 C) (Oral)  Resp 24  Ht 5\' 5"  (1.651 m)  Wt 180 lb (81.647 kg)  BMI 29.95 kg/m2  SpO2 97%  Physical Exam He is alert and oriented 3 cooperative compliant. His lungs show distant breath sounds. He is barrel chested using oxygen at present. I hear no wheezing or rales. His heart is regular rhythm without murmur. Inspection of the anterior abdominal wall with the patient in the supine position reveals some deep-seated tenderness with palpation  approximately 2-3 cm below the T8-T9 costal rib marginsIn the anterior clavicular line. This is in the right upper abdominal quadrant. His belly is distended and obese but no guarding or  tenderness otherwise noted. Inspection of the right posterior some scapular region reveals some tenderness and a trigger point  and this does radiate pain into the anterior abdominal wall where his primary referred pain complaint resides.  Assessment & Plan:   [name] was seen today for back pain.  Diagnoses and all orders for this visit:  DDD (degenerative disc disease), lumbar  Rib pain on right side -     TRIGGER POINT INJECTION -     dexamethasone (DECADRON) injection 10 mg; 1 mL (10 mg total) by Other route once. -     ropivacaine (PF) 2 mg/ml (0.2%) (NAROPIN) epidural 10 mL; 10 mLs by Epidural route once.  Right-sided thoracic back pain -     TRIGGER POINT INJECTION -     dexamethasone  (DECADRON) injection 10 mg; 1 mL (10 mg total) by Other route once.     ----------------------------------------------------------------------------------------------------------------------  Problem List Items Addressed This Visit    None    Visit Diagnoses    DDD (degenerative disc disease), lumbar    -  Primary    Relevant Medications    dexamethasone (DECADRON) injection 10 mg (Start on 07/17/2015  3:45 PM)    Rib pain on right side        Relevant Medications    dexamethasone (DECADRON) injection 10 mg (Start on 07/17/2015  3:45 PM)    ropivacaine (PF) 2 mg/ml (0.2%) (NAROPIN) epidural 10 mL (Start on 07/17/2015  3:45 PM)    Right-sided thoracic back pain        Relevant Medications    dexamethasone (DECADRON) injection 10 mg (Start on 07/17/2015  3:45 PM)       ----------------------------------------------------------------------------------------------------------------------  1. Rib pain on right side  We will repeat the trigger point injection today   2. Right-sided thoracic back pain   3. DDD (degenerative disc disease), lumbar and thoracic degenerative disc disease We will have him return to clinic in 1  month for reevaluation. Depending on his results from today's procedure we may repeat trigger point versus proceeding with a diagnostic thoracic epidural injection.   4. COPD exacerbation (HCC)   5. Abdominal pain, right upper quadrant     ----------------------------------------------------------------------------------------------------------------------  I am having Mr. Brosseau maintain his omeprazole, ziprasidone, amLODipine, furosemide, carvedilol, sertraline, roflumilast, tiotropium, albuterol, albuterol, prochlorperazine, loperamide, fenofibrate, acetaminophen, Vitamin D3, budesonide-formoterol, cetirizine, tamsulosin, amoxicillin-clavulanate, and traMADol. We will continue to administer dexamethasone, ropivacaine (PF) 2 mg/ml (0.2%), dexamethasone, and  ropivacaine (PF) 2 mg/ml (0.2%).   Meds ordered this encounter  Medications  . dexamethasone (DECADRON) injection 10 mg    Sig:   . ropivacaine (PF) 2 mg/ml (0.2%) (NAROPIN) epidural 10 mL    Sig:        Follow-up: We'll proceed with a trigger point injection to the right posterior sub-scapular region today as reviewed with the patient in full detail. All questions and concerns arise regarding this. We have talked about potential for injury to the underlying nerve considering his COPD and proximity to the rib margin. He has been instructed to seek ER evaluation for any shortness of breath following the procedure. We'll have him return to clinic approximately 1 month    Procedure: Myoneural block: After informed consent was obtained and risks benefits reviewed with performed a trigger point injection to the aforementioned area. The area overlying the right sub-scapular region approximately T8 level right mid axillary line was prepped with alcohol and injected with a 25-gauge needle and a total dose of 8 cc of ropivacaine 0.2% next with approximately 8 mg of Decadron were infiltrated. There is no evidence of any air aspiration or difficulty with the procedure and he was convalesced discharged home in stable condition.   Yevette Edwards, MD

## 2015-07-18 ENCOUNTER — Telehealth: Payer: Self-pay | Admitting: *Deleted

## 2015-07-18 NOTE — Telephone Encounter (Signed)
Patient verbalizes no  Complications from procedure on yesterday.

## 2015-07-25 DIAGNOSIS — I2510 Atherosclerotic heart disease of native coronary artery without angina pectoris: Secondary | ICD-10-CM | POA: Insufficient documentation

## 2015-08-22 ENCOUNTER — Ambulatory Visit: Payer: Medicare PPO | Attending: Anesthesiology | Admitting: Anesthesiology

## 2015-08-22 ENCOUNTER — Encounter: Payer: Self-pay | Admitting: Anesthesiology

## 2015-08-22 VITALS — BP 153/124 | HR 89 | Temp 98.1°F | Resp 17 | Ht 65.0 in | Wt 180.0 lb

## 2015-08-22 DIAGNOSIS — E785 Hyperlipidemia, unspecified: Secondary | ICD-10-CM | POA: Diagnosis not present

## 2015-08-22 DIAGNOSIS — F419 Anxiety disorder, unspecified: Secondary | ICD-10-CM | POA: Insufficient documentation

## 2015-08-22 DIAGNOSIS — R51 Headache: Secondary | ICD-10-CM | POA: Diagnosis not present

## 2015-08-22 DIAGNOSIS — I10 Essential (primary) hypertension: Secondary | ICD-10-CM | POA: Diagnosis not present

## 2015-08-22 DIAGNOSIS — N400 Enlarged prostate without lower urinary tract symptoms: Secondary | ICD-10-CM | POA: Diagnosis not present

## 2015-08-22 DIAGNOSIS — M546 Pain in thoracic spine: Secondary | ICD-10-CM | POA: Diagnosis not present

## 2015-08-22 DIAGNOSIS — M549 Dorsalgia, unspecified: Secondary | ICD-10-CM | POA: Diagnosis present

## 2015-08-22 DIAGNOSIS — F4310 Post-traumatic stress disorder, unspecified: Secondary | ICD-10-CM | POA: Diagnosis not present

## 2015-08-22 DIAGNOSIS — Z95810 Presence of automatic (implantable) cardiac defibrillator: Secondary | ICD-10-CM | POA: Insufficient documentation

## 2015-08-22 DIAGNOSIS — K219 Gastro-esophageal reflux disease without esophagitis: Secondary | ICD-10-CM | POA: Diagnosis not present

## 2015-08-22 DIAGNOSIS — R1011 Right upper quadrant pain: Secondary | ICD-10-CM | POA: Diagnosis not present

## 2015-08-22 DIAGNOSIS — Z87891 Personal history of nicotine dependence: Secondary | ICD-10-CM | POA: Insufficient documentation

## 2015-08-22 DIAGNOSIS — R0781 Pleurodynia: Secondary | ICD-10-CM | POA: Diagnosis not present

## 2015-08-22 DIAGNOSIS — J441 Chronic obstructive pulmonary disease with (acute) exacerbation: Secondary | ICD-10-CM | POA: Diagnosis not present

## 2015-08-22 DIAGNOSIS — M5134 Other intervertebral disc degeneration, thoracic region: Secondary | ICD-10-CM | POA: Diagnosis not present

## 2015-08-22 DIAGNOSIS — M5136 Other intervertebral disc degeneration, lumbar region: Secondary | ICD-10-CM | POA: Diagnosis not present

## 2015-08-22 MED ORDER — DEXAMETHASONE SODIUM PHOSPHATE 10 MG/ML IJ SOLN: 10.0000 mg | Freq: Once | INTRAMUSCULAR | Status: DC

## 2015-08-22 MED ORDER — ROPIVACAINE HCL 2 MG/ML IJ SOLN
INTRAMUSCULAR | Status: AC
  Administered 2015-08-22: 15:00:00
  Filled 2015-08-22: qty 10

## 2015-08-22 MED ORDER — TRIAMCINOLONE ACETONIDE 40 MG/ML IJ SUSP
INTRAMUSCULAR | Status: AC
  Administered 2015-08-22: 16:00:00
  Filled 2015-08-22: qty 1

## 2015-08-22 MED ORDER — ROPIVACAINE HCL 2 MG/ML IJ SOLN: 10.0000 mL | Freq: Once | INTRAMUSCULAR | Status: DC

## 2015-08-22 NOTE — Patient Instructions (Signed)
Trigger Point Injection Trigger points are areas where you have muscle pain. A trigger point injection is a shot given in the trigger point to relieve that pain. A trigger point might feel like a knot in your muscle. It hurts to press on a trigger point. Sometimes the pain spreads out (radiates) to other parts of the body. For example, pressing on a trigger point in your shoulder might cause pain in your arm or neck. You might have one trigger point. Or, you might have more than one. People often have trigger points in their upper back and lower back. They also occur often in the neck and shoulders. Pain from a trigger point lasts for a long time. It can make it hard to keep moving. You might not be able to do the exercise or physical therapy that could help you deal with the pain. A trigger point injection may help. It does not work for everyone. But, it may relieve your pain for a few days or a few months. A trigger point injection does not cure long-lasting (chronic) pain. LET YOUR CAREGIVER KNOW ABOUT:  Any allergies (especially to latex, lidocaine, or steroids).  Blood-thinning medicines that you take. These drugs can lead to bleeding or bruising after an injection. They include:  Aspirin.  Ibuprofen.  Clopidogrel.  Warfarin.  Other medicines you take. This includes all vitamins, herbs, eyedrops, over-the-counter medicines, and creams.  Use of steroids.  Recent infections.  Past problems with numbing medicines.  Bleeding problems.  Surgeries you have had.  Other health problems. RISKS AND COMPLICATIONS A trigger point injection is a safe treatment. However, problems may develop, such as:  Minor side effects usually go away in 1 to 2 days. These may include:  Soreness.  Bruising.  Stiffness.  More serious problems are rare. But, they may include:  Bleeding under the skin (hematoma).  Skin infection.  Breaking off of the needle under your skin.  Lung  puncture.  The trigger point injection may not work for you. BEFORE THE PROCEDURE You may need to stop taking any medicine that thins your blood. This is to prevent bleeding and bruising. Usually these medicines are stopped several days before the injection. No other preparation is needed. PROCEDURE  A trigger point injection can be given in your caregiver's office or in a clinic. Each injection takes 2 minutes or less.  Your caregiver will feel for trigger points. The caregiver may use a marker to circle the area for the injection.  The skin over the trigger point will be washed with a germ-killing (antiseptic) solution.  The caregiver pinches the spot for the injection.  Then, a very thin needle is used for the shot. You may feel pain or a twitching feeling when the needle enters the trigger point.  A numbing solution may be injected into the trigger point. Sometimes a drug to keep down swelling, redness, and warmth (inflammation) is also injected.  Your caregiver moves the needle around the trigger zone until the tightness and twitching goes away.  After the injection, your caregiver may put gentle pressure over the injection site.  Then it is covered with a bandage. AFTER THE PROCEDURE  You can go right home after the injection.  The bandage can be taken off after a few hours.  You may feel sore and stiff for 1 to 2 days.  Go back to your regular activities slowly. Your caregiver may ask you to stretch your muscles. Do not do anything that takes   extra energy for a few days.  Follow your caregiver's instructions to manage and treat other pain.   This information is not intended to replace advice given to you by your health care provider. Make sure you discuss any questions you have with your health care provider.   Document Released: 04/09/2011 Document Revised: 08/15/2012 Document Reviewed: 04/09/2011 Else[employer] Interactive Patient Education 2016 [employer] Pain  Management Discharge Instructions  General Discharge Instructions :  If you need to reach your doctor call: Monday-Friday 8:00 am - 4:00 pm at [phone] or toll free [phone].  After clinic hours [phone] to have operator reach doctor.  Bring all of your medication bottles to all your appointments in the pain clinic.  To cancel or reschedule your appointment with Pain Management please remember to call 24 hours in advance to avoid a fee.  Refer to the educational materials which you have been given on: General Risks, I had my Procedure. Discharge Instructions, Post Sedation.  Post Procedure Instructions:  The drugs you were given will stay in your system until tomorrow, so for the next 24 hours you should not drive, make any legal decisions or drink any alcoholic beverages.  You may eat anything you prefer, but it is better to start with liquids then soups and crackers, and gradually work up to solid foods.  Please notify your doctor immediately if you have any unusual bleeding, trouble breathing or pain that is not related to your normal pain.  Depending on the type of procedure that was done, some parts of your body may feel week and/or numb.  This usually clears up by tonight or the next day.  Walk with the use of an assistive device or accompanied by an adult for the 24 hours.  You may use ice on the affected area for the first 24 hours.  Put ice in a Ziploc bag and cover with a towel and place against area 15 minutes on 15 minutes off.  You may switch to heat after 24 hours. 

## 2015-08-22 NOTE — Progress Notes (Signed)
Safety precautions to be maintained throughout the outpatient stay will include: orient to surroundings, keep bed in low position, maintain call bell within reach at all times, provide assistance with transfer out of bed and ambulation.  

## 2015-08-23 ENCOUNTER — Telehealth: Payer: Self-pay | Admitting: *Deleted

## 2015-08-23 NOTE — Progress Notes (Signed)
Subjective:  Patient ID: Robert AgarBilly R Ball, male    DOB: Mar 27, 1946  Age: 70 y.o. MRN: [id number]  CC: Back Pain  procedure: Right posterior shoulder trigger point injection 2  #3  HPI Robert Ball presents for right side upper abdominal pain and posterior back pain. He is followed by Dr. Harrington Challengerthies and has been referred for evaluation management. He describes an area over the right anterior abdominal region that has caused him considerable deep-seated pain for the past 2-3 months. He's gone through an extensive workup for this including abdominal CT scan chest x-rays in an effort to find an underlying source. He denies any significant improvement with any medication management for physical therapy, stretching strengthening exercises since his last evaluation and desires to proceed with a repeat trigger point injection today. His last injection gave him complete relief for 3 weeks. He has had recurrence of the same quality and desires to proceed with a repeat injection today to see if he can get more sustained relief.  He has a very complicated history of severe COPD and he is on oxygen at present and uses this at night as well. He takes continuous steroids for his COPD and uses oxycodone for chronic pain. He also takes Flexeril and lorazepam.  History Robert Ball has a past medical history of PTSD (post-traumatic stress disorder); Hypertension; Anxiety; Depression; Prostate enlargement; GERD (gastroesophageal reflux disease); Headache; Elevated lipids; Shakes; COPD (chronic obstructive pulmonary disease) (HCC); and Cardiac defibrillator in place.   He has past surgical history that includes Neck surgery; Hernia repair; Hemorrhoid surgery; Cataract extraction w/ intraocular lens  implant, bilateral (Bilateral); Eye muscle surgery (Bilateral); Icd lead removal (Left, 12/27/2014); and Back surgery.   His family history includes Heart disease in his father and mother; Lymphoma in his father.He reports that he  quit smoking about 7 years ago. His smoking use included Cigarettes. He has a 200 pack-year smoking history. He does not have any smokeless tobacco history on file. He reports that he drinks alcohol. He reports that he does not use illicit drugs.   ---------------------------------------------------------------------------------------------------------------------- Past Medical History  Diagnosis Date  . PTSD (post-traumatic stress disorder)   . Hypertension   . Anxiety   . Depression   . Prostate enlargement   . GERD (gastroesophageal reflux disease)   . Headache   . Elevated lipids   . Shakes   . COPD (chronic obstructive pulmonary disease) (HCC)     On 2liter oxygen  . Cardiac defibrillator in place     Past Surgical History  Procedure Laterality Date  . Neck surgery    . Hernia repair    . Hemorrhoid surgery    . Cataract extraction w/ intraocular lens  implant, bilateral Bilateral   . Eye muscle surgery Bilateral   . Icd lead removal Left 12/27/2014    Procedure: ICD LEAD REMOVAL/ICD GENERATOR CHANGE OUT;  Surgeon: Marcina MillardAlexander Paraschos, MD;  Location: [facility] ORS;  Service: Cardiovascular;  Laterality: Left;  . Back surgery      Family History  Problem Relation Age of Onset  . Heart disease Father   . Heart disease Mother   . Lymphoma Father     Social History  Substance Use Topics  . Smoking status: Former Smoker -- 5.00 packs/day for 40 years    Types: Cigarettes    Quit date: 09/24/2007  . Smokeless tobacco: Not on file  . Alcohol Use: 0.0 oz/week    0 Standard drinks or equivalent per week  Comment: daily, beer and hard liquor    ---------------------------------------------------------------------------------------------------------------------- Social History   Social History  . Marital Status: Married    Spouse Name: N/A  . Number of Children: N/A  . Years of Education: N/A   Social History Main Topics  . Smoking status: Former Smoker -- 5.00  packs/day for 40 years    Types: Cigarettes    Quit date: 09/24/2007  . Smokeless tobacco: None  . Alcohol Use: 0.0 oz/week    0 Standard drinks or equivalent per week     Comment: daily, beer and hard liquor  . Drug Use: No  . Sexual Activity: Not Asked   Other Topics Concern  . None   Social History Narrative      ----------------------------------------------------------------------------------------------------------------------  ROS Review of Systems  Without change  Objective:  BP 135/82 mmHg  Pulse 86  Temp(Src) 97.9 F (36.6 C) (Oral)  Resp 24  Ht  (1.651 m)  Wt 180 lb (81.647 kg)  BMI 29.95 kg/m2  SpO2 97%  Physical Exam He is alert and oriented 3 cooperative compliant. His lungs show distant breath sounds. He is barrel chested using oxygen at present. I hear no wheezing or rales. His heart is regular rhythm without murmur. Inspection of the anterior abdominal wall with the patient in the supine position reveals some deep-seated tenderness with palpation  approximately 2-3 cm below the T8-T9 costal rib marginsIn the anterior clavicular line. This is in the right upper abdominal quadrant. His belly is distended and obese but no guarding or  tenderness otherwise noted. Inspection of the right posterior some scapular region reveals some tenderness and a trigger point  and this does radiate pain into the anterior abdominal wall where his primary referred pain complaint resides.  Assessment & Plan:   Lizandro was seen today for back pain.  Diagnoses and all orders for this visit:  DDD (degenerative disc disease), lumbar  Rib pain on right side -     TRIGGER POINT INJECTION -     dexamethasone (DECADRON) injection 10 mg; 1 mL (10 mg total) by Other route once. -     ropivacaine (PF) 2 mg/ml (0.2%) (NAROPIN) epidural 10 mL; 10 mLs by Epidural route once.  Right-sided thoracic back pain -     TRIGGER POINT INJECTION -     dexamethasone (DECADRON) injection 10  mg; 1 mL (10 mg total) by Other route once.     ----------------------------------------------------------------------------------------------------------------------  Problem List Items Addressed This Visit    None    Visit Diagnoses    DDD (degenerative disc disease), lumbar    -  Primary    Relevant Medications    dexamethasone (DECADRON) injection 10 mg (Start on 07/17/2015  3:45 PM)    Rib pain on right side        Relevant Medications    dexamethasone (DECADRON) injection 10 mg (Start on 07/17/2015  3:45 PM)    ropivacaine (PF) 2 mg/ml (0.2%) (NAROPIN) epidural 10 mL (Start on 07/17/2015  3:45 PM)    Right-sided thoracic back pain        Relevant Medications    dexamethasone (DECADRON) injection 10 mg (Start on 07/17/2015  3:45 PM)       ----------------------------------------------------------------------------------------------------------------------  1. Rib pain on right side  We will repeat the trigger point injection today   2. Right-sided thoracic back pain   3. DDD (degenerative disc disease), lumbar and thoracic degenerative disc disease We will have him return to clinic in 1  month for reevaluation. Depending on his results from today's procedure we may repeat trigger point versus proceeding with a diagnostic thoracic epidural injection.   4. COPD exacerbation (HCC)   5. Abdominal pain, right upper quadrant     ----------------------------------------------------------------------------------------------------------------------  I am having Mr. Biello maintain his omeprazole, ziprasidone, amLODipine, furosemide, carvedilol, sertraline, roflumilast, tiotropium, albuterol, albuterol, prochlorperazine, loperamide, fenofibrate, acetaminophen, Vitamin D3, budesonide-formoterol, cetirizine, tamsulosin, amoxicillin-clavulanate, and traMADol. We will continue to administer dexamethasone, ropivacaine (PF) 2 mg/ml (0.2%), dexamethasone, and ropivacaine (PF) 2 mg/ml  (0.2%).   Meds ordered this encounter  Medications  . dexamethasone (DECADRON) injection 10 mg    Sig:   . ropivacaine (PF) 2 mg/ml (0.2%) (NAROPIN) epidural 10 mL    Sig:        Follow-up: We'll proceed with a trigger point injection to the right posterior sub-scapular region today as reviewed with the patient in full detail. All questions and concerns arise regarding this. We have talked about potential for injury to the underlying nerve considering his COPD and proximity to the rib margin. He has been instructed to seek ER evaluation for any shortness of breath following the procedure. We'll have him return to clinic approximately 1 month    Procedure: Myoneural block: After informed consent was obtained and risks benefits reviewed with performed a trigger point injection to the aforementioned area. The area overlying the right sub-scapular region approximately T8 level right mid axillary line was prepped with alcohol and injected with a 25-gauge needle and a total dose of 8 cc of ropivacaine 0.2% next with approximately 8 mg of Decadron were infiltrated. There is no evidence of any air aspiration or difficulty with the procedure and he was convalesced discharged home in stable condition.   Yevette Edwards, MD

## 2015-08-23 NOTE — Telephone Encounter (Signed)
Spoke with Mrs Robert Ball, denies any questions or concerns from procedure on yesterday.

## 2015-09-19 ENCOUNTER — Ambulatory Visit: Payer: Medicare PPO | Attending: Anesthesiology | Admitting: Anesthesiology

## 2015-09-19 VITALS — BP 137/66 | HR 78 | Temp 98.2°F | Resp 16 | Ht 65.0 in | Wt 190.0 lb

## 2015-09-19 DIAGNOSIS — N400 Enlarged prostate without lower urinary tract symptoms: Secondary | ICD-10-CM | POA: Diagnosis not present

## 2015-09-19 DIAGNOSIS — M5386 Other specified dorsopathies, lumbar region: Secondary | ICD-10-CM

## 2015-09-19 DIAGNOSIS — M5136 Other intervertebral disc degeneration, lumbar region: Secondary | ICD-10-CM | POA: Insufficient documentation

## 2015-09-19 DIAGNOSIS — R0781 Pleurodynia: Secondary | ICD-10-CM | POA: Insufficient documentation

## 2015-09-19 DIAGNOSIS — R1011 Right upper quadrant pain: Secondary | ICD-10-CM | POA: Diagnosis not present

## 2015-09-19 DIAGNOSIS — Z87891 Personal history of nicotine dependence: Secondary | ICD-10-CM | POA: Diagnosis not present

## 2015-09-19 DIAGNOSIS — K219 Gastro-esophageal reflux disease without esophagitis: Secondary | ICD-10-CM | POA: Insufficient documentation

## 2015-09-19 DIAGNOSIS — M546 Pain in thoracic spine: Secondary | ICD-10-CM | POA: Diagnosis not present

## 2015-09-19 DIAGNOSIS — R51 Headache: Secondary | ICD-10-CM | POA: Diagnosis not present

## 2015-09-19 DIAGNOSIS — Z95810 Presence of automatic (implantable) cardiac defibrillator: Secondary | ICD-10-CM | POA: Diagnosis not present

## 2015-09-19 DIAGNOSIS — M5134 Other intervertebral disc degeneration, thoracic region: Secondary | ICD-10-CM | POA: Insufficient documentation

## 2015-09-19 DIAGNOSIS — F418 Other specified anxiety disorders: Secondary | ICD-10-CM | POA: Diagnosis not present

## 2015-09-19 DIAGNOSIS — J441 Chronic obstructive pulmonary disease with (acute) exacerbation: Secondary | ICD-10-CM | POA: Diagnosis not present

## 2015-09-19 DIAGNOSIS — M549 Dorsalgia, unspecified: Secondary | ICD-10-CM | POA: Diagnosis present

## 2015-09-19 DIAGNOSIS — M545 Low back pain: Secondary | ICD-10-CM

## 2015-09-19 DIAGNOSIS — F4310 Post-traumatic stress disorder, unspecified: Secondary | ICD-10-CM | POA: Diagnosis not present

## 2015-09-19 DIAGNOSIS — I10 Essential (primary) hypertension: Secondary | ICD-10-CM | POA: Insufficient documentation

## 2015-09-19 DIAGNOSIS — E785 Hyperlipidemia, unspecified: Secondary | ICD-10-CM | POA: Insufficient documentation

## 2015-09-19 MED ORDER — ROPIVACAINE HCL 2 MG/ML IJ SOLN
10.0000 mL | Freq: Once | INTRAMUSCULAR | Status: AC
  Administered 2015-09-19: 10 mL via EPIDURAL
  Filled 2015-09-19: qty 10

## 2015-09-19 MED ORDER — TRIAMCINOLONE ACETONIDE 40 MG/ML IJ SUSP
40.0000 mg | Freq: Once | INTRAMUSCULAR | Status: AC
  Administered 2015-09-19: 40 mg
  Filled 2015-09-19: qty 1

## 2015-09-19 NOTE — Progress Notes (Signed)
Safety precautions to be maintained throughout the outpatient stay will include: orient to surroundings, keep bed in low position, maintain call bell within reach at all times, provide assistance with transfer out of bed and ambulation.  

## 2015-09-23 NOTE — Progress Notes (Signed)
Subjective:  Patient ID: Robert Ball, male    DOB: [birth year]  Age: 70 y.o. MRN: [id number]  CC: Back Pain  procedure: Right posterior shoulder trigger point injection 2  #4  HPI Robert Ball presents for right side upper abdominal pain and posterior back pain.He is now status post 3 previous trigger point injections and feels like he is making some progress with the pain in his right back and anterior abdominal area. He is followed by Dr. Harrington Challenger and has been referred for evaluation management. He continues to have an area over the right anterior abdominal region that has caused him considerable deep-seated pain for the past Several months. He's gone through an extensive workup for this including abdominal CT scan chest x-rays in an effort to find an underlying source. He denies any significant improvement with any medication management for physical therapy, stretching strengthening exercises since his last evaluation and desires to proceed with a repeat trigger point injection today. His last injection gave him complete relief for 3 weeks. He has had recurrence of the same quality and desires to proceed with a repeat injection today to see if he can get more sustained relief.  He has a very complicated history of severe COPD and he is on oxygen at present and uses this at night as well. He takes continuous steroids for his COPD and uses oxycodone for chronic pain. He also takes Flexeril and lorazepam.  History Robert Ball has a past medical history of PTSD (post-traumatic stress disorder); Hypertension; Anxiety; Depression; Prostate enlargement; GERD (gastroesophageal reflux disease); Headache; Elevated lipids; Shakes; COPD (chronic obstructive pulmonary disease) (HCC); and Cardiac defibrillator in place.   He has past surgical history that includes Neck surgery; Hernia repair; Hemorrhoid surgery; Cataract extraction w/ intraocular lens  implant, bilateral (Bilateral); Eye muscle surgery  (Bilateral); Icd lead removal (Left, 12/27/2014); and Back surgery.   His family history includes Heart disease in his father and mother; Lymphoma in his father.He reports that he quit smoking about 7 years ago. His smoking use included Cigarettes. He has a 200 pack-year smoking history. He does not have any smokeless tobacco history on file. He reports that he drinks alcohol. He reports that he does not use illicit drugs.   ---------------------------------------------------------------------------------------------------------------------- Past Medical History  Diagnosis Date  . PTSD (post-traumatic stress disorder)   . Hypertension   . Anxiety   . Depression   . Prostate enlargement   . GERD (gastroesophageal reflux disease)   . Headache   . Elevated lipids   . Shakes   . COPD (chronic obstructive pulmonary disease) (HCC)     On 2liter oxygen  . Cardiac defibrillator in place     Past Surgical History  Procedure Laterality Date  . Neck surgery    . Hernia repair    . Hemorrhoid surgery    . Cataract extraction w/ intraocular lens  implant, bilateral Bilateral   . Eye muscle surgery Bilateral   . Icd lead removal Left 12/27/2014    Procedure: ICD LEAD REMOVAL/ICD GENERATOR CHANGE OUT;  Surgeon: Marcina Millard, MD;  Location: [facility] ORS;  Service: Cardiovascular;  Laterality: Left;  . Back surgery      Family History  Problem Relation Age of Onset  . Heart disease Father   . Heart disease Mother   . Lymphoma Father     Social History  Substance Use Topics  . Smoking status: Former Smoker -- 5.00 packs/day for 40 years    Types: Cigarettes  Quit date: 09/24/2007  . Smokeless tobacco: Not on file  . Alcohol Use: 0.0 oz/week    0 Standard drinks or equivalent per week     Comment: daily, beer and hard liquor    ---------------------------------------------------------------------------------------------------------------------- Social History   Social History   . Marital Status: Married    Spouse Name: N/A  . Number of Children: N/A  . Years of Education: N/A   Social History Main Topics  . Smoking status: Former Smoker -- 5.00 packs/day for 40 years    Types: Cigarettes    Quit date: 09/24/2007  . Smokeless tobacco: None  . Alcohol Use: 0.0 oz/week    0 Standard drinks or equivalent per week     Comment: daily, beer and hard liquor  . Drug Use: No  . Sexual Activity: Not Asked   Other Topics Concern  . None   Social History Narrative      ----------------------------------------------------------------------------------------------------------------------  ROS Review of Systems  Without change  Objective:  BP 135/82 mmHg  Pulse 86  Temp(Src) 97.9 F (36.6 C) (Oral)  Resp 24  Ht 5\' 5"  (1.651 m)  Wt 180 lb (81.647 kg)  BMI 29.95 kg/m2  SpO2 97%  Physical Exam He is alert and oriented 3 cooperative compliant. His lungs show distant breath sounds. He is barrel chested using oxygen at present. I hear no wheezing or rales. His heart is regular rhythm without murmur. Inspection of the anterior abdominal wall with the patient in the supine position reveals some deep-seated tenderness with palpation  approximately 2-3 cm below the T8-T9 costal rib marginsIn the anterior clavicular line. This is in the right upper abdominal quadrant. His belly is distended and obese but no guarding or  tenderness otherwise noted. Inspection of the right posterior some scapular region reveals some tenderness and a trigger point  and this does radiate pain into the anterior abdominal wall where his primary referred pain complaint resides.  Assessment & Plan:   Robert Ball was seen today for back pain.  Diagnoses and all orders for this visit:  DDD (degenerative disc disease), lumbar  Rib pain on right side -     TRIGGER POINT INJECTION -     dexamethasone (DECADRON) injection 10 mg; 1 mL (10 mg total) by Other route once. -     ropivacaine (PF) 2  mg/ml (0.2%) (NAROPIN) epidural 10 mL; 10 mLs by Epidural route once.  Right-sided thoracic back pain -     TRIGGER POINT INJECTION -     dexamethasone (DECADRON) injection 10 mg; 1 mL (10 mg total) by Other route once.     ----------------------------------------------------------------------------------------------------------------------  Problem List Items Addressed This Visit    None    Visit Diagnoses    DDD (degenerative disc disease), lumbar    -  Primary    Relevant Medications    dexamethasone (DECADRON) injection 10 mg (Start on 07/17/2015  3:45 PM)    Rib pain on right side        Relevant Medications    dexamethasone (DECADRON) injection 10 mg (Start on 07/17/2015  3:45 PM)    ropivacaine (PF) 2 mg/ml (0.2%) (NAROPIN) epidural 10 mL (Start on 07/17/2015  3:45 PM)    Right-sided thoracic back pain        Relevant Medications    dexamethasone (DECADRON) injection 10 mg (Start on 07/17/2015  3:45 PM)       ----------------------------------------------------------------------------------------------------------------------  1. Rib pain on right side  We will repeat the trigger point injection  today. He appears to be making some slow but considerable progress with this pain and desires to proceed with a repeat injection today. We will do this for him in clinic today and plan on a return in approximately 2 months and see if we can span is out   2. Right-sided thoracic back pain   3. DDD (degenerative disc disease), lumbar and thoracic degenerative disc disease We will have him return to clinic in 1 month for reevaluation. Depending on his results from today's procedure we may repeat trigger point versus proceeding with a diagnostic thoracic epidural injection.   4. COPD exacerbation (HCC)   5. Abdominal pain, right upper quadrant     ----------------------------------------------------------------------------------------------------------------------  I am  having Robert Ball maintain his omeprazole, ziprasidone, amLODipine, furosemide, carvedilol, sertraline, roflumilast, tiotropium, albuterol, albuterol, prochlorperazine, loperamide, fenofibrate, acetaminophen, Vitamin D3, budesonide-formoterol, cetirizine, tamsulosin, amoxicillin-clavulanate, and traMADol. We will continue to administer dexamethasone, ropivacaine (PF) 2 mg/ml (0.2%), dexamethasone, and ropivacaine (PF) 2 mg/ml (0.2%).   Meds ordered this encounter  Medications  . dexamethasone (DECADRON) injection 10 mg    Sig:   . ropivacaine (PF) 2 mg/ml (0.2%) (NAROPIN) epidural 10 mL    Sig:        Follow-up: We'll proceed with a trigger point injection to the right posterior sub-scapular region today as reviewed with the patient in full detail. All questions and concerns arise regarding this. We have talked about potential for injury to the underlying nerve considering his COPD and proximity to the rib margin. He has been instructed to seek ER evaluation for any shortness of breath following the procedure. We'll have him return to clinic approximately 1 month    Procedure: Myoneural block: After informed consent was obtained and risks benefits reviewed with performed a trigger point injection to the aforementioned area. The area overlying the right sub-scapular region approximately T8 level right mid axillary line was prepped with alcohol and injected with a 25-gauge needle and a total dose of 8 cc of ropivacaine 0.2% next with approximately 8 mg of Decadron were infiltrated. There is no evidence of any air aspiration or difficulty with the procedure and he was convalesced discharged home in stable condition.   Yevette EdwardsJames G Adams, MD

## 2015-10-15 ENCOUNTER — Encounter: Payer: Self-pay | Admitting: Urology

## 2015-10-15 ENCOUNTER — Ambulatory Visit (INDEPENDENT_AMBULATORY_CARE_PROVIDER_SITE_OTHER): Payer: Medicare PPO | Admitting: Urology

## 2015-10-15 VITALS — Ht 65.0 in | Wt 191.5 lb

## 2015-10-15 DIAGNOSIS — R351 Nocturia: Secondary | ICD-10-CM | POA: Insufficient documentation

## 2015-10-15 DIAGNOSIS — N138 Other obstructive and reflux uropathy: Secondary | ICD-10-CM

## 2015-10-15 DIAGNOSIS — N401 Enlarged prostate with lower urinary tract symptoms: Secondary | ICD-10-CM

## 2015-10-15 LAB — BLADDER SCAN AMB NON-IMAGING: Scan Result: 34

## 2015-10-15 NOTE — Progress Notes (Signed)
10/15/2015 9:17 AM   Robert Ball Jun 01, 1945 [id number]  Referring provider: Mickey Farber, MD [address] [facility] [place], [place] [address]  Chief Complaint  Patient presents with  . Follow-up    BPH    HPI: Mr Guard is a 70yo here for followuop for BPH with LUTS, urinary retention. He is currently on flomax BID.  He has nocturia 2-3x. He is not bothered. He has never been on finasteride. NO side effects from flomax. No hematuria PVR 36cc.    PMH: Past Medical History  Diagnosis Date  . PTSD (post-traumatic stress disorder)   . Hypertension   . Anxiety   . Depression   . Prostate enlargement   . GERD (gastroesophageal reflux disease)   . Headache   . Elevated lipids   . Shakes   . COPD (chronic obstructive pulmonary disease) (HCC)     On 2liter oxygen  . Cardiac defibrillator in place     Surgical History: Past Surgical History  Procedure Laterality Date  . Neck surgery    . Hernia repair    . Hemorrhoid surgery    . Cataract extraction w/ intraocular lens  implant, bilateral Bilateral   . Eye muscle surgery Bilateral   . Icd lead removal Left 12/27/2014    Procedure: ICD LEAD REMOVAL/ICD GENERATOR CHANGE OUT;  Surgeon: Marcina Millard, MD;  Location: [facility] ORS;  Service: Cardiovascular;  Laterality: Left;  . Back surgery      Home Medications:    Medication List       This list is accurate as of: 10/15/15  9:17 AM.  Always use your most recent med list.               acetaminophen 500 MG tablet  Commonly known as:  TYLENOL  Take 500 mg by mouth every 6 (six) hours as needed for mild pain or headache.     albuterol (2.5 MG/3ML) 0.083% nebulizer solution  Commonly known as:  PROVENTIL  Take 2.5 mg by nebulization every 6 (six) hours as needed for wheezing or shortness of breath.     albuterol 108 (90 Base) MCG/ACT inhaler  Commonly known as:  PROVENTIL HFA;VENTOLIN HFA  Inhale 2 puffs into the lungs every 6 (six) hours  as needed for wheezing or shortness of breath.     amLODipine 10 MG tablet  Commonly known as:  NORVASC  Take 10 mg by mouth daily.     amoxicillin-clavulanate 875-125 MG tablet  Commonly known as:  AUGMENTIN  Take 1 tablet by mouth 2 (two) times daily.     carvedilol 12.5 MG tablet  Commonly known as:  COREG  Take 12.5 mg by mouth 2 (two) times daily.     cetirizine 10 MG tablet  Commonly known as:  ZYRTEC  Take by mouth.     fenofibrate 160 MG tablet  Take 160 mg by mouth daily.     furosemide 40 MG tablet  Commonly known as:  LASIX  Take 40 mg by mouth daily.     loperamide 2 MG capsule  Commonly known as:  IMODIUM  Take 4 mg by mouth daily.     omeprazole 20 MG capsule  Commonly known as:  PRILOSEC  Take 20 mg by mouth 2 (two) times daily.     predniSONE 10 MG tablet  Commonly known as:  DELTASONE  Take 10 mg by mouth daily.     prochlorperazine 5 MG tablet  Commonly known as:  COMPAZINE  Take 5 mg by mouth 3 (three) times daily as needed for nausea or vomiting.     roflumilast 500 MCG Tabs tablet  Commonly known as:  DALIRESP  Take 500 mcg by mouth daily.     sertraline 100 MG tablet  Commonly known as:  ZOLOFT  Take 100 mg by mouth 2 (two) times daily. Reported on 07/17/2015     SYMBICORT 160-4.5 MCG/ACT inhaler  Generic drug:  budesonide-formoterol  Inhale into the lungs.     tamsulosin 0.4 MG Caps capsule  Commonly known as:  FLOMAX  Take 1 capsule (0.4 mg total) by mouth daily.     tiotropium 18 MCG inhalation capsule  Commonly known as:  SPIRIVA  Place 18 mcg into inhaler and inhale daily.     traMADol 50 MG tablet  Commonly known as:  ULTRAM  Take 1 tablet (50 mg total) by mouth every 6 (six) hours as needed.     Vitamin D3 5000 units Caps  Take 5,000 Units by mouth every other day. Reported on 08/22/2015     ziprasidone 60 MG capsule  Commonly known as:  GEODON  Take 60 mg by mouth at bedtime.        Allergies:  Allergies    Allergen Reactions  . Aspirin Other (See Comments) and Itching    Pt is unable to take this medication.    . Ciprofloxacin Hcl Other (See Comments)    Reaction:  Unknown   . Ibuprofen Other (See Comments) and Itching    Pt is unable to take this medication.    . Metronidazole Other (See Comments)    Reaction:  Unknown   . Naproxen Other (See Comments)    Pt is unable to take this medication.    . Oxycodone Itching  . Tetracyclines & Related Other (See Comments)    Reaction:  Unknown   . Ciprofloxacin Rash  . Nitrofurantoin Rash  . Sulfa Antibiotics Rash  . Tetracycline Rash    Family History: Family History  Problem Relation Age of Onset  . Heart disease Father   . Heart disease Mother   . Lymphoma Father     Social History:  reports that he quit smoking about 8 years ago. His smoking use included Cigarettes. He has a 200 pack-year smoking history. He does not have any smokeless tobacco history on file. He reports that he drinks alcohol. He reports that he does not use illicit drugs.  ROS: UROLOGY Frequent Urination?: Yes Hard to postpone urination?: No Burning/pain with urination?: No Get up at night to urinate?: No Leakage of urine?: No Urine stream starts and stops?: No Trouble starting stream?: No Do you have to strain to urinate?: No Blood in urine?: No Urinary tract infection?: No Sexually transmitted disease?: No Injury to kidneys or bladder?: No Painful intercourse?: No Weak stream?: No Erection problems?: No Penile pain?: No  Gastrointestinal Nausea?: No Vomiting?: No Indigestion/heartburn?: No Diarrhea?: No Constipation?: No  Constitutional Fever: No Night sweats?: No Weight loss?: No Fatigue?: No  Skin Skin rash/lesions?: No Itching?: No  Eyes Blurred vision?: No Double vision?: No  Ears/Nose/Throat Sore throat?: No Sinus problems?: No  Hematologic/Lymphatic Swollen glands?: No Easy bruising?: No  Cardiovascular Leg  swelling?: Yes Chest pain?: No  Respiratory Cough?: No Shortness of breath?: Yes  Endocrine Excessive thirst?: No  Musculoskeletal Back pain?: No Joint pain?: No  Neurological Headaches?: No Dizziness?: No  Psychologic Depression?: Yes Anxiety?: No  Physical Exam: Ht  (  1.651 m)  Wt 86.864 kg (191 lb 8 oz)  BMI 31.87 kg/m2  Constitutional:  Alert and oriented, No acute distress. HEENT: [place] AT, moist mucus membranes.  Trachea midline, no masses. Cardiovascular: No clubbing, cyanosis, or edema. Respiratory: Normal respiratory effort, no increased work of breathing. GI: Abdomen is soft, nontender, nondistended, no abdominal masses GU: No CVA tenderness.  Skin: No rashes, bruises or suspicious lesions. Lymph: No cervical or inguinal adenopathy. Neurologic: Grossly intact, no focal deficits, moving all 4 extremities. Psychiatric: Normal mood and affect.  Laboratory Data: Lab Results  Component Value Date   WBC 7.9 06/27/2015   HGB 14.5 06/27/2015   HCT 41.6 06/27/2015   MCV 89.7 06/27/2015   PLT 163 06/27/2015    Lab Results  Component Value Date   CREATININE 1.44* 06/27/2015    No results found for: PSA  No results found for: TESTOSTERONE  No results found for: HGBA1C  Urinalysis    Component Value Date/Time   COLORURINE YELLOW* 06/27/2015 1312   COLORURINE Yellow 04/19/2012 0025   APPEARANCEUR Clear 07/16/2015 0835   APPEARANCEUR CLEAR* 06/27/2015 1312   APPEARANCEUR Clear 04/19/2012 0025   LABSPEC 1.011 06/27/2015 1312   LABSPEC 1.020 04/19/2012 0025   PHURINE 7.0 06/27/2015 1312   PHURINE 5.0 04/19/2012 0025   GLUCOSEU Negative 07/16/2015 0835   GLUCOSEU 50 mg/dL 57/84/696212/17/2013 95280025   HGBUR NEGATIVE 06/27/2015 1312   HGBUR Negative 04/19/2012 0025   BILIRUBINUR Negative 07/16/2015 0835   BILIRUBINUR NEGATIVE 06/27/2015 1312   BILIRUBINUR Negative 04/19/2012 0025   KETONESUR NEGATIVE 06/27/2015 1312   KETONESUR Negative 04/19/2012 0025    PROTEINUR Negative 07/16/2015 0835   PROTEINUR NEGATIVE 06/27/2015 1312   PROTEINUR Negative 04/19/2012 0025   NITRITE Negative 07/16/2015 0835   NITRITE NEGATIVE 06/27/2015 1312   NITRITE Negative 04/19/2012 0025   LEUKOCYTESUR Negative 07/16/2015 0835   LEUKOCYTESUR NEGATIVE 06/27/2015 1312   LEUKOCYTESUR Trace 04/19/2012 0025    Pertinent Imaging: none  Assessment & Plan:    1. Benign prostatic hyperplasia with urinary obstruction -continue flomax 0.4mg  BID RTC 6 months - Urinalysis, Complete   No Follow-up on file.  Wilkie AyePatrick Morio Widen, MD  [facility] [address], [address] [place], [place] [address] [phone]

## 2015-10-15 NOTE — Addendum Note (Signed)
Addended by: Lonna CobbUSSELL, KELITA L on: 10/15/2015 10:17 AM   Modules accepted: Orders

## 2015-10-31 ENCOUNTER — Encounter: Payer: Self-pay | Admitting: Anesthesiology

## 2015-10-31 ENCOUNTER — Ambulatory Visit: Payer: Medicare PPO | Attending: Anesthesiology | Admitting: Anesthesiology

## 2015-10-31 VITALS — BP 163/80 | HR 80 | Temp 98.0°F | Resp 18 | Ht 65.0 in | Wt 180.0 lb

## 2015-10-31 DIAGNOSIS — Z807 Family history of other malignant neoplasms of lymphoid, hematopoietic and related tissues: Secondary | ICD-10-CM | POA: Insufficient documentation

## 2015-10-31 DIAGNOSIS — M546 Pain in thoracic spine: Secondary | ICD-10-CM

## 2015-10-31 DIAGNOSIS — M545 Low back pain: Secondary | ICD-10-CM

## 2015-10-31 DIAGNOSIS — G8929 Other chronic pain: Secondary | ICD-10-CM | POA: Insufficient documentation

## 2015-10-31 DIAGNOSIS — Z961 Presence of intraocular lens: Secondary | ICD-10-CM | POA: Insufficient documentation

## 2015-10-31 DIAGNOSIS — I10 Essential (primary) hypertension: Secondary | ICD-10-CM | POA: Diagnosis not present

## 2015-10-31 DIAGNOSIS — M5386 Other specified dorsopathies, lumbar region: Secondary | ICD-10-CM

## 2015-10-31 DIAGNOSIS — N400 Enlarged prostate without lower urinary tract symptoms: Secondary | ICD-10-CM | POA: Diagnosis not present

## 2015-10-31 DIAGNOSIS — E785 Hyperlipidemia, unspecified: Secondary | ICD-10-CM | POA: Diagnosis not present

## 2015-10-31 DIAGNOSIS — Z8719 Personal history of other diseases of the digestive system: Secondary | ICD-10-CM | POA: Insufficient documentation

## 2015-10-31 DIAGNOSIS — Z87891 Personal history of nicotine dependence: Secondary | ICD-10-CM | POA: Insufficient documentation

## 2015-10-31 DIAGNOSIS — M549 Dorsalgia, unspecified: Secondary | ICD-10-CM | POA: Diagnosis present

## 2015-10-31 DIAGNOSIS — K219 Gastro-esophageal reflux disease without esophagitis: Secondary | ICD-10-CM | POA: Diagnosis not present

## 2015-10-31 DIAGNOSIS — R1011 Right upper quadrant pain: Secondary | ICD-10-CM | POA: Diagnosis not present

## 2015-10-31 DIAGNOSIS — M5134 Other intervertebral disc degeneration, thoracic region: Secondary | ICD-10-CM | POA: Diagnosis not present

## 2015-10-31 DIAGNOSIS — Z95810 Presence of automatic (implantable) cardiac defibrillator: Secondary | ICD-10-CM | POA: Diagnosis not present

## 2015-10-31 DIAGNOSIS — F418 Other specified anxiety disorders: Secondary | ICD-10-CM | POA: Diagnosis not present

## 2015-10-31 DIAGNOSIS — M5136 Other intervertebral disc degeneration, lumbar region: Secondary | ICD-10-CM | POA: Insufficient documentation

## 2015-10-31 DIAGNOSIS — Z8249 Family history of ischemic heart disease and other diseases of the circulatory system: Secondary | ICD-10-CM | POA: Diagnosis not present

## 2015-10-31 DIAGNOSIS — F4310 Post-traumatic stress disorder, unspecified: Secondary | ICD-10-CM | POA: Insufficient documentation

## 2015-10-31 DIAGNOSIS — J441 Chronic obstructive pulmonary disease with (acute) exacerbation: Secondary | ICD-10-CM | POA: Diagnosis not present

## 2015-10-31 DIAGNOSIS — Z7951 Long term (current) use of inhaled steroids: Secondary | ICD-10-CM | POA: Diagnosis not present

## 2015-10-31 DIAGNOSIS — R0781 Pleurodynia: Secondary | ICD-10-CM | POA: Diagnosis not present

## 2015-10-31 MED ORDER — DEXAMETHASONE SODIUM PHOSPHATE 10 MG/ML IJ SOLN
INTRAMUSCULAR | Status: AC
  Filled 2015-10-31: qty 1

## 2015-10-31 MED ORDER — DEXAMETHASONE SODIUM PHOSPHATE 10 MG/ML IJ SOLN: 10.0000 mg | Freq: Once | INTRAMUSCULAR | Status: DC

## 2015-10-31 MED ORDER — ROPIVACAINE HCL 2 MG/ML IJ SOLN: 10.0000 mL | Freq: Once | INTRAMUSCULAR | Status: DC

## 2015-10-31 MED ORDER — ROPIVACAINE HCL 2 MG/ML IJ SOLN
INTRAMUSCULAR | Status: AC
  Filled 2015-10-31: qty 10

## 2015-10-31 NOTE — Patient Instructions (Signed)
Trigger Point Injection Trigger points are areas where you have muscle pain. A trigger point injection is a shot given in the trigger point to relieve that pain. A trigger point might feel like a knot in your muscle. It hurts to press on a trigger point. Sometimes the pain spreads out (radiates) to other parts of the body. For example, pressing on a trigger point in your shoulder might cause pain in your arm or neck. You might have one trigger point. Or, you might have more than one. People often have trigger points in their upper back and lower back. They also occur often in the neck and shoulders. Pain from a trigger point lasts for a long time. It can make it hard to keep moving. You might not be able to do the exercise or physical therapy that could help you deal with the pain. A trigger point injection may help. It does not work for everyone. But, it may relieve your pain for a few days or a few months. A trigger point injection does not cure long-lasting (chronic) pain. LET YOUR CAREGIVER KNOW ABOUT:  Any allergies (especially to latex, lidocaine, or steroids).  Blood-thinning medicines that you take. These drugs can lead to bleeding or bruising after an injection. They include:  Aspirin.  Ibuprofen.  Clopidogrel.  Warfarin.  Other medicines you take. This includes all vitamins, herbs, eyedrops, over-the-counter medicines, and creams.  Use of steroids.  Recent infections.  Past problems with numbing medicines.  Bleeding problems.  Surgeries you have had.  Other health problems. RISKS AND COMPLICATIONS A trigger point injection is a safe treatment. However, problems may develop, such as:  Minor side effects usually go away in 1 to 2 days. These may include:  Soreness.  Bruising.  Stiffness.  More serious problems are rare. But, they may include:  Bleeding under the skin (hematoma).  Skin infection.  Breaking off of the needle under your skin.  Lung  puncture.  The trigger point injection may not work for you. BEFORE THE PROCEDURE You may need to stop taking any medicine that thins your blood. This is to prevent bleeding and bruising. Usually these medicines are stopped several days before the injection. No other preparation is needed. PROCEDURE  A trigger point injection can be given in your caregiver's office or in a clinic. Each injection takes 2 minutes or less.  Your caregiver will feel for trigger points. The caregiver may use a marker to circle the area for the injection.  The skin over the trigger point will be washed with a germ-killing (antiseptic) solution.  The caregiver pinches the spot for the injection.  Then, a very thin needle is used for the shot. You may feel pain or a twitching feeling when the needle enters the trigger point.  A numbing solution may be injected into the trigger point. Sometimes a drug to keep down swelling, redness, and warmth (inflammation) is also injected.  Your caregiver moves the needle around the trigger zone until the tightness and twitching goes away.  After the injection, your caregiver may put gentle pressure over the injection site.  Then it is covered with a bandage. AFTER THE PROCEDURE  You can go right home after the injection.  The bandage can be taken off after a few hours.  You may feel sore and stiff for 1 to 2 days.  Go back to your regular activities slowly. Your caregiver may ask you to stretch your muscles. Do not do anything that takes   extra energy for a few days.  Follow your caregiver's instructions to manage and treat other pain.   This information is not intended to replace advice given to you by your health care provider. Make sure you discuss any questions you have with your health care provider.   Document Released: 04/09/2011 Document Revised: 08/15/2012 Document Reviewed: 04/09/2011 Else[employer] Interactive Patient Education 2016 [employer] Pain  Management Discharge Instructions  General Discharge Instructions :  If you need to reach your doctor call: Monday-Friday 8:00 am - 4:00 pm at [phone] or toll free [phone].  After clinic hours [phone] to have operator reach doctor.  Bring all of your medication bottles to all your appointments in the pain clinic.  To cancel or reschedule your appointment with Pain Management please remember to call 24 hours in advance to avoid a fee.  Refer to the educational materials which you have been given on: General Risks, I had my Procedure. Discharge Instructions, Post Sedation.  Post Procedure Instructions:  The drugs you were given will stay in your system until tomorrow, so for the next 24 hours you should not drive, make any legal decisions or drink any alcoholic beverages.  You may eat anything you prefer, but it is better to start with liquids then soups and crackers, and gradually work up to solid foods.  Please notify your doctor immediately if you have any unusual bleeding, trouble breathing or pain that is not related to your normal pain.  Depending on the type of procedure that was done, some parts of your body may feel week and/or numb.  This usually clears up by tonight or the next day.  Walk with the use of an assistive device or accompanied by an adult for the 24 hours.  You may use ice on the affected area for the first 24 hours.  Put ice in a Ziploc bag and cover with a towel and place against area 15 minutes on 15 minutes off.  You may switch to heat after 24 hours. 

## 2015-10-31 NOTE — Progress Notes (Signed)
Subjective:  Patient ID: Robert Ball, male    DOB: [birth year]  Age: 70 y.o. MRN: [id number]  CC: Back Pain  procedure: Right posterior shoulder trigger point injection 2  #5  HPI Robert AgarBilly R Ritson presents for right side upper abdominal pain and posterior back pain.He is now status post 4 previous trigger point injections and feels like he is making some progress with the pain in his right back and anterior abdominal area. He is followed by Dr. Harrington Challengerhies and has been referred for evaluation management. He continues to have an area over the right anterior abdominal region that has caused him considerable deep-seated pain for the past Several months. He's gone through an extensive workup for this including abdominal CT scan chest x-rays in an effort to find an underlying source. He denies any significant improvement with any medication management for physical therapy, stretching strengthening exercises since his last evaluation and desires to proceed with a repeat trigger point injection today. His last injection gave him complete relief for 3 weeks. He has had recurrence of the same quality and desires to proceed with a repeat injection today to see if he can get more sustained relief.He does feel that overall he is making progress in that the recurrence of the severe pain is less frequent and he sleeping better at night  He has a very complicated history of severe COPD and he is on oxygen at present and uses this at night as well. He takes continuous steroids for his COPD and uses oxycodone for chronic pain. He also takes Flexeril and lorazepam.  History Robert Ball has a past medical history of PTSD (post-traumatic stress disorder); Hypertension; Anxiety; Depression; Prostate enlargement; GERD (gastroesophageal reflux disease); Headache; Elevated lipids; Shakes; COPD (chronic obstructive pulmonary disease) (HCC); and Cardiac defibrillator in place.   He has past surgical history that includes Neck surgery;  Hernia repair; Hemorrhoid surgery; Cataract extraction w/ intraocular lens  implant, bilateral (Bilateral); Eye muscle surgery (Bilateral); Icd lead removal (Left, 12/27/2014); and Back surgery.   His family history includes Heart disease in his father and mother; Lymphoma in his father.He reports that he quit smoking about 7 years ago. His smoking use included Cigarettes. He has a 200 pack-year smoking history. He does not have any smokeless tobacco history on file. He reports that he drinks alcohol. He reports that he does not use illicit drugs.   ---------------------------------------------------------------------------------------------------------------------- Past Medical History  Diagnosis Date  . PTSD (post-traumatic stress disorder)   . Hypertension   . Anxiety   . Depression   . Prostate enlargement   . GERD (gastroesophageal reflux disease)   . Headache   . Elevated lipids   . Shakes   . COPD (chronic obstructive pulmonary disease) (HCC)     On 2liter oxygen  . Cardiac defibrillator in place     Past Surgical History  Procedure Laterality Date  . Neck surgery    . Hernia repair    . Hemorrhoid surgery    . Cataract extraction w/ intraocular lens  implant, bilateral Bilateral   . Eye muscle surgery Bilateral   . Icd lead removal Left 12/27/2014    Procedure: ICD LEAD REMOVAL/ICD GENERATOR CHANGE OUT;  Surgeon: Marcina MillardAlexander Paraschos, MD;  Location: [facility] ORS;  Service: Cardiovascular;  Laterality: Left;  . Back surgery      Family History  Problem Relation Age of Onset  . Heart disease Father   . Heart disease Mother   . Lymphoma Father  Social History  Substance Use Topics  . Smoking status: Former Smoker -- 5.00 packs/day for 40 years    Types: Cigarettes    Quit date: 09/24/2007  . Smokeless tobacco: Not on file  . Alcohol Use: 0.0 oz/week    0 Standard drinks or equivalent per week     Comment: daily, beer and hard liquor     ---------------------------------------------------------------------------------------------------------------------- Social History   Social History  . Marital Status: Married    Spouse Name: N/A  . Number of Children: N/A  . Years of Education: N/A   Social History Main Topics  . Smoking status: Former Smoker -- 5.00 packs/day for 40 years    Types: Cigarettes    Quit date: 09/24/2007  . Smokeless tobacco: None  . Alcohol Use: 0.0 oz/week    0 Standard drinks or equivalent per week     Comment: daily, beer and hard liquor  . Drug Use: No  . Sexual Activity: Not Asked   Other Topics Concern  . None   Social History Narrative      ----------------------------------------------------------------------------------------------------------------------  ROS Review of Systems  Without change  Objective:  BP 135/82 mmHg  Pulse 86  Temp(Src) 97.9 F (36.6 C) (Oral)  Resp 24  Ht  (1.651 m)  Wt 180 lb (81.647 kg)  BMI 29.95 kg/m2  SpO2 97%  Physical Exam He is alert and oriented 3 cooperative compliant. His lungs show distant breath sounds. He is barrel chested using oxygen at present. I hear no wheezing or rales. His heart is regular rhythm without murmur. Inspection of the anterior abdominal wall with the patient in the supine position reveals some deep-seated tenderness with palpation  approximately 2-3 cm below the T8-T9 costal rib marginsIn the anterior clavicular line. This is in the right upper abdominal quadrant. His belly is distended and obese but no guarding or  tenderness otherwise noted. Inspection of the right posterior some scapular region reveals some tenderness and a trigger point  and this does radiate pain into the anterior abdominal wall where his primary referred pain complaint resides.  Assessment & Plan:   Robert Ball was seen today for back pain.  Diagnoses and all orders for this visit:  DDD (degenerative disc disease), lumbar  Rib pain on  right side -     TRIGGER POINT INJECTION -     dexamethasone (DECADRON) injection 10 mg; 1 mL (10 mg total) by Other route once. -     ropivacaine (PF) 2 mg/ml (0.2%) (NAROPIN) epidural 10 mL; 10 mLs by Epidural route once.  Right-sided thoracic back pain -     TRIGGER POINT INJECTION -     dexamethasone (DECADRON) injection 10 mg; 1 mL (10 mg total) by Other route once.     ----------------------------------------------------------------------------------------------------------------------  Problem List Items Addressed This Visit    None    Visit Diagnoses    DDD (degenerative disc disease), lumbar    -  Primary    Relevant Medications    dexamethasone (DECADRON) injection 10 mg (Start on 07/17/2015  3:45 PM)    Rib pain on right side        Relevant Medications    dexamethasone (DECADRON) injection 10 mg (Start on 07/17/2015  3:45 PM)    ropivacaine (PF) 2 mg/ml (0.2%) (NAROPIN) epidural 10 mL (Start on 07/17/2015  3:45 PM)    Right-sided thoracic back pain        Relevant Medications    dexamethasone (DECADRON) injection 10 mg (Start on  07/17/2015  3:45 PM)       ----------------------------------------------------------------------------------------------------------------------  1. Rib pain on right side  We will repeat the trigger point injection today. He appears to be making some slow but considerable progress with this pain and desires to proceed with a repeat injection today. We will do this for him in clinic today and plan on a return in approximately 2 months and And continue to span these out 2. Right-sided thoracic back pain   3. DDD (degenerative disc disease), lumbar and thoracic degenerative disc disease We will have him return to clinic in 1 month for reevaluation. He is wary of proceeding with a thoracic epidural injection secondary to the required positioning.   4. COPD exacerbation (HCC)   5. Abdominal pain, right upper  quadrant     ----------------------------------------------------------------------------------------------------------------------  I am having Robert Ball maintain his omeprazole, ziprasidone, amLODipine, furosemide, carvedilol, sertraline, roflumilast, tiotropium, albuterol, albuterol, prochlorperazine, loperamide, fenofibrate, acetaminophen, Vitamin D3, budesonide-formoterol, cetirizine, tamsulosin, amoxicillin-clavulanate, and traMADol. We will continue to administer dexamethasone, ropivacaine (PF) 2 mg/ml (0.2%), dexamethasone, and ropivacaine (PF) 2 mg/ml (0.2%).   Meds ordered this encounter  Medications  . dexamethasone (DECADRON) injection 10 mg    Sig:   . ropivacaine (PF) 2 mg/ml (0.2%) (NAROPIN) epidural 10 mL    Sig:        Follow-up: We'll proceed with a trigger point injection to the right posterior sub-scapular region today as reviewed with the patient in full detail. All questions and concerns arise regarding this. We have talked about potential for injury to the underlying nerve considering his COPD and proximity to the rib margin. He has been instructed to seek ER evaluation for any shortness of breath following the procedure. We'll have him return to clinic approximately 1 month    Procedure: Myoneural block: After informed consent was obtained and risks benefits reviewed with performed a trigger point injection to the aforementioned area. The area overlying the right sub-scapular region approximately T8 level right mid axillary line was prepped with alcohol and injected with a 25-gauge needle and a total dose of 8 cc of ropivacaine 0.2% next with approximately 8 mg of Decadron were infiltrated. There is no evidence of any air aspiration or difficulty with the procedure and he was convalesced discharged home in stable condition.   Yevette EdwardsJames G Adams, MD

## 2015-10-31 NOTE — Progress Notes (Signed)
Safety precautions to be maintained throughout the outpatient stay will include: orient to surroundings, keep bed in low position, maintain call bell within reach at all times, provide assistance with transfer out of bed and ambulation.  

## 2015-11-01 ENCOUNTER — Telehealth: Payer: Self-pay

## 2015-11-01 NOTE — Telephone Encounter (Signed)
Denies any needs- states he is a little sore but OK

## 2015-11-15 ENCOUNTER — Other Ambulatory Visit: Payer: Self-pay | Admitting: Internal Medicine

## 2015-11-15 DIAGNOSIS — R10811 Right upper quadrant abdominal tenderness: Secondary | ICD-10-CM

## 2015-11-15 DIAGNOSIS — R110 Nausea: Secondary | ICD-10-CM

## 2015-11-15 DIAGNOSIS — N183 Chronic kidney disease, stage 3 unspecified: Secondary | ICD-10-CM

## 2015-11-18 ENCOUNTER — Ambulatory Visit
Admission: RE | Admit: 2015-11-18 | Discharge: 2015-11-18 | Disposition: A | Discharge status: Home / Self Care | Payer: Medicare PPO | Source: Ambulatory Visit | Attending: Internal Medicine | Admitting: Internal Medicine

## 2015-11-18 DIAGNOSIS — R10811 Right upper quadrant abdominal tenderness: Secondary | ICD-10-CM | POA: Diagnosis present

## 2015-11-18 DIAGNOSIS — N183 Chronic kidney disease, stage 3 unspecified: Secondary | ICD-10-CM

## 2015-11-18 DIAGNOSIS — R110 Nausea: Secondary | ICD-10-CM | POA: Insufficient documentation

## 2015-11-18 DIAGNOSIS — R161 Splenomegaly, not elsewhere classified: Secondary | ICD-10-CM | POA: Diagnosis not present

## 2015-12-02 ENCOUNTER — Other Ambulatory Visit
Admission: RE | Admit: 2015-12-02 | Discharge: 2015-12-02 | Disposition: A | Discharge status: Home / Self Care | Payer: Medicare PPO | Source: Ambulatory Visit | Attending: Nurse Practitioner | Admitting: Nurse Practitioner

## 2015-12-02 ENCOUNTER — Other Ambulatory Visit: Payer: Self-pay | Admitting: Nurse Practitioner

## 2015-12-02 DIAGNOSIS — R110 Nausea: Secondary | ICD-10-CM

## 2015-12-02 DIAGNOSIS — K529 Noninfective gastroenteritis and colitis, unspecified: Secondary | ICD-10-CM | POA: Insufficient documentation

## 2015-12-02 DIAGNOSIS — R1011 Right upper quadrant pain: Secondary | ICD-10-CM

## 2015-12-02 LAB — GASTROINTESTINAL PANEL BY PCR, STOOL (REPLACES STOOL CULTURE)
ADENOVIRUS F40/41: NOT DETECTED
Astrovirus: NOT DETECTED
CRYPTOSPORIDIUM: NOT DETECTED
CYCLOSPORA CAYETANENSIS: NOT DETECTED
Campylobacter species: NOT DETECTED
E. coli O157: NOT DETECTED
ENTEROPATHOGENIC E COLI (EPEC): NOT DETECTED
Entamoeba histolytica: NOT DETECTED
Enteroaggregative E coli (EAEC): NOT DETECTED
Enterotoxigenic E coli (ETEC): NOT DETECTED
Giardia lamblia: NOT DETECTED
Norovirus GI/GII: NOT DETECTED
Plesimonas shigelloides: NOT DETECTED
ROTAVIRUS A: NOT DETECTED
SALMONELLA SPECIES: NOT DETECTED
SAPOVIRUS (I, II, IV, AND V): NOT DETECTED
SHIGA LIKE TOXIN PRODUCING E COLI (STEC): NOT DETECTED
SHIGELLA/ENTEROINVASIVE E COLI (EIEC): NOT DETECTED
VIBRIO SPECIES: NOT DETECTED
Vibrio cholerae: NOT DETECTED
YERSINIA ENTEROCOLITICA: NOT DETECTED

## 2015-12-05 ENCOUNTER — Inpatient Hospital Stay: Payer: Medicare PPO

## 2015-12-05 ENCOUNTER — Ambulatory Visit
Admission: RE | Admit: 2015-12-05 | Discharge: 2015-12-05 | Disposition: A | Discharge status: Home / Self Care | Payer: Medicare PPO | Source: Ambulatory Visit | Attending: Nurse Practitioner | Admitting: Nurse Practitioner

## 2015-12-05 ENCOUNTER — Inpatient Hospital Stay
Admission: EM | Admit: 2015-12-05 | Discharge: 2015-12-11 | DRG: 915 | Discharge status: Against Medical Advice | Payer: Medicare PPO | Attending: Internal Medicine | Admitting: Internal Medicine

## 2015-12-05 ENCOUNTER — Emergency Department: Payer: Medicare PPO

## 2015-12-05 DIAGNOSIS — K219 Gastro-esophageal reflux disease without esophagitis: Secondary | ICD-10-CM

## 2015-12-05 DIAGNOSIS — Z4659 Encounter for fitting and adjustment of other gastrointestinal appliance and device: Secondary | ICD-10-CM

## 2015-12-05 DIAGNOSIS — T886XXA Anaphylactic reaction due to adverse effect of correct drug or medicament properly administered, initial encounter: Secondary | ICD-10-CM | POA: Diagnosis present

## 2015-12-05 DIAGNOSIS — Z01818 Encounter for other preprocedural examination: Secondary | ICD-10-CM

## 2015-12-05 DIAGNOSIS — K760 Fatty (change of) liver, not elsewhere classified: Secondary | ICD-10-CM

## 2015-12-05 DIAGNOSIS — F1010 Alcohol abuse, uncomplicated: Secondary | ICD-10-CM | POA: Diagnosis present

## 2015-12-05 DIAGNOSIS — Y842 Radiological procedure and radiotherapy as the cause of abnormal reaction of the patient, or of later complication, without mention of misadventure at the time of the procedure: Secondary | ICD-10-CM | POA: Diagnosis present

## 2015-12-05 DIAGNOSIS — T782XXA Anaphylactic shock, unspecified, initial encounter: Secondary | ICD-10-CM | POA: Diagnosis present

## 2015-12-05 DIAGNOSIS — I130 Hypertensive heart and chronic kidney disease with heart failure and stage 1 through stage 4 chronic kidney disease, or unspecified chronic kidney disease: Secondary | ICD-10-CM | POA: Diagnosis present

## 2015-12-05 DIAGNOSIS — R110 Nausea: Secondary | ICD-10-CM

## 2015-12-05 DIAGNOSIS — R1084 Generalized abdominal pain: Secondary | ICD-10-CM

## 2015-12-05 DIAGNOSIS — K5792 Diverticulitis of intestine, part unspecified, without perforation or abscess without bleeding: Secondary | ICD-10-CM | POA: Diagnosis present

## 2015-12-05 DIAGNOSIS — K529 Noninfective gastroenteritis and colitis, unspecified: Secondary | ICD-10-CM | POA: Insufficient documentation

## 2015-12-05 DIAGNOSIS — K5730 Diverticulosis of large intestine without perforation or abscess without bleeding: Secondary | ICD-10-CM | POA: Insufficient documentation

## 2015-12-05 DIAGNOSIS — E785 Hyperlipidemia, unspecified: Secondary | ICD-10-CM | POA: Diagnosis present

## 2015-12-05 DIAGNOSIS — J449 Chronic obstructive pulmonary disease, unspecified: Secondary | ICD-10-CM | POA: Diagnosis not present

## 2015-12-05 DIAGNOSIS — A0472 Enterocolitis due to Clostridium difficile, not specified as recurrent: Secondary | ICD-10-CM

## 2015-12-05 DIAGNOSIS — I5022 Chronic systolic (congestive) heart failure: Secondary | ICD-10-CM | POA: Diagnosis present

## 2015-12-05 DIAGNOSIS — K861 Other chronic pancreatitis: Secondary | ICD-10-CM | POA: Diagnosis present

## 2015-12-05 DIAGNOSIS — Z95810 Presence of automatic (implantable) cardiac defibrillator: Secondary | ICD-10-CM | POA: Diagnosis not present

## 2015-12-05 DIAGNOSIS — F4310 Post-traumatic stress disorder, unspecified: Secondary | ICD-10-CM | POA: Diagnosis present

## 2015-12-05 DIAGNOSIS — I2510 Atherosclerotic heart disease of native coronary artery without angina pectoris: Secondary | ICD-10-CM | POA: Diagnosis present

## 2015-12-05 DIAGNOSIS — N182 Chronic kidney disease, stage 2 (mild): Secondary | ICD-10-CM | POA: Diagnosis present

## 2015-12-05 DIAGNOSIS — Z6827 Body mass index (BMI) 27.0-27.9, adult: Secondary | ICD-10-CM

## 2015-12-05 DIAGNOSIS — R161 Splenomegaly, not elsewhere classified: Secondary | ICD-10-CM | POA: Insufficient documentation

## 2015-12-05 DIAGNOSIS — J9620 Acute and chronic respiratory failure, unspecified whether with hypoxia or hypercapnia: Secondary | ICD-10-CM | POA: Diagnosis not present

## 2015-12-05 DIAGNOSIS — R0603 Acute respiratory distress: Secondary | ICD-10-CM

## 2015-12-05 DIAGNOSIS — T783XXD Angioneurotic edema, subsequent encounter: Secondary | ICD-10-CM | POA: Diagnosis not present

## 2015-12-05 DIAGNOSIS — T508X5A Adverse effect of diagnostic agents, initial encounter: Secondary | ICD-10-CM | POA: Diagnosis present

## 2015-12-05 DIAGNOSIS — K7010 Alcoholic hepatitis without ascites: Secondary | ICD-10-CM | POA: Diagnosis present

## 2015-12-05 DIAGNOSIS — J9811 Atelectasis: Secondary | ICD-10-CM | POA: Diagnosis present

## 2015-12-05 DIAGNOSIS — R531 Weakness: Secondary | ICD-10-CM

## 2015-12-05 DIAGNOSIS — A047 Enterocolitis due to Clostridium difficile: Secondary | ICD-10-CM | POA: Diagnosis present

## 2015-12-05 DIAGNOSIS — J441 Chronic obstructive pulmonary disease with (acute) exacerbation: Secondary | ICD-10-CM | POA: Diagnosis present

## 2015-12-05 DIAGNOSIS — N179 Acute kidney failure, unspecified: Secondary | ICD-10-CM | POA: Diagnosis present

## 2015-12-05 DIAGNOSIS — Y929 Unspecified place or not applicable: Secondary | ICD-10-CM

## 2015-12-05 DIAGNOSIS — E669 Obesity, unspecified: Secondary | ICD-10-CM | POA: Diagnosis present

## 2015-12-05 DIAGNOSIS — R1011 Right upper quadrant pain: Secondary | ICD-10-CM | POA: Insufficient documentation

## 2015-12-05 DIAGNOSIS — Z8249 Family history of ischemic heart disease and other diseases of the circulatory system: Secondary | ICD-10-CM

## 2015-12-05 DIAGNOSIS — R0600 Dyspnea, unspecified: Secondary | ICD-10-CM

## 2015-12-05 DIAGNOSIS — Z9981 Dependence on supplemental oxygen: Secondary | ICD-10-CM | POA: Diagnosis not present

## 2015-12-05 DIAGNOSIS — J9601 Acute respiratory failure with hypoxia: Secondary | ICD-10-CM | POA: Diagnosis present

## 2015-12-05 DIAGNOSIS — T782XXD Anaphylactic shock, unspecified, subsequent encounter: Secondary | ICD-10-CM | POA: Diagnosis not present

## 2015-12-05 DIAGNOSIS — N400 Enlarged prostate without lower urinary tract symptoms: Secondary | ICD-10-CM | POA: Diagnosis present

## 2015-12-05 DIAGNOSIS — R739 Hyperglycemia, unspecified: Secondary | ICD-10-CM | POA: Diagnosis present

## 2015-12-05 DIAGNOSIS — N329 Bladder disorder, unspecified: Secondary | ICD-10-CM | POA: Insufficient documentation

## 2015-12-05 DIAGNOSIS — Z807 Family history of other malignant neoplasms of lymphoid, hematopoietic and related tissues: Secondary | ICD-10-CM

## 2015-12-05 DIAGNOSIS — R451 Restlessness and agitation: Secondary | ICD-10-CM | POA: Diagnosis not present

## 2015-12-05 DIAGNOSIS — K921 Melena: Secondary | ICD-10-CM | POA: Insufficient documentation

## 2015-12-05 DIAGNOSIS — Z87891 Personal history of nicotine dependence: Secondary | ICD-10-CM

## 2015-12-05 DIAGNOSIS — Z79899 Other long term (current) drug therapy: Secondary | ICD-10-CM

## 2015-12-05 DIAGNOSIS — Z7952 Long term (current) use of systemic steroids: Secondary | ICD-10-CM

## 2015-12-05 DIAGNOSIS — I700 Atherosclerosis of aorta: Secondary | ICD-10-CM

## 2015-12-05 DIAGNOSIS — R740 Nonspecific elevation of levels of transaminase and lactic acid dehydrogenase [LDH]: Secondary | ICD-10-CM | POA: Diagnosis present

## 2015-12-05 LAB — BLOOD GAS, ARTERIAL
ACID-BASE DEFICIT: 8.5 mmol/L — AB (ref 0.0–2.0)
Allens test (pass/fail): POSITIVE — AB
BICARBONATE: 18.4 meq/L — AB (ref 21.0–28.0)
FIO2: 1
MECHVT: 500 mL
Mechanical Rate: 20
O2 SAT: 99.8 %
PCO2 ART: 42 mmHg (ref 32.0–48.0)
PEEP/CPAP: 5 cmH2O
PH ART: 7.25 — AB (ref 7.350–7.450)
PO2 ART: 263 mmHg — AB (ref 83.0–108.0)
Patient temperature: 37

## 2015-12-05 LAB — CREATININE, SERUM
Creatinine, Ser: 1.93 mg/dL — ABNORMAL HIGH (ref 0.61–1.24)
GFR calc non Af Amer: 33 mL/min — ABNORMAL LOW (ref >60)
GFR, EST AFRICAN AMERICAN: 38 mL/min — AB (ref >60)

## 2015-12-05 LAB — BASIC METABOLIC PANEL
Anion gap: 10 (ref 5–15)
BUN: 13 mg/dL (ref 6–20)
CHLORIDE: 100 mmol/L — AB (ref 101–111)
CO2: 22 mmol/L (ref 22–32)
CREATININE: 1.88 mg/dL — AB (ref 0.61–1.24)
Calcium: 8.9 mg/dL (ref 8.9–10.3)
GFR calc non Af Amer: 34 mL/min — ABNORMAL LOW (ref >60)
GFR, EST AFRICAN AMERICAN: 39 mL/min — AB (ref >60)
GLUCOSE: 218 mg/dL — AB (ref 65–99)
Potassium: 4 mmol/L (ref 3.5–5.1)
Sodium: 132 mmol/L — ABNORMAL LOW (ref 135–145)

## 2015-12-05 LAB — GLUCOSE, CAPILLARY: Glucose-Capillary: 211 mg/dL — ABNORMAL HIGH (ref 65–99)

## 2015-12-05 LAB — LACTIC ACID, PLASMA: Lactic Acid, Venous: 1.8 mmol/L (ref 0.5–1.9)

## 2015-12-05 LAB — CBC
HEMATOCRIT: 44.2 % (ref 40.0–52.0)
Hemoglobin: 15.1 g/dL (ref 13.0–18.0)
MCH: 31.5 pg (ref 26.0–34.0)
MCHC: 34.1 g/dL (ref 32.0–36.0)
MCV: 92.3 fL (ref 80.0–100.0)
Platelets: 294 10*3/uL (ref 150–440)
RBC: 4.79 MIL/uL (ref 4.40–5.90)
RDW: 13.9 % (ref 11.5–14.5)
WBC: 27 10*3/uL — ABNORMAL HIGH (ref 3.8–10.6)

## 2015-12-05 LAB — MRSA PCR SCREENING: MRSA by PCR: NEGATIVE

## 2015-12-05 MED ORDER — CHLORHEXIDINE GLUCONATE 0.12% ORAL RINSE (MEDLINE KIT)
15.0000 mL | Freq: Two times a day (BID) | OROMUCOSAL | Status: DC
  Administered 2015-12-05 – 2015-12-07 (×4): 15 mL via OROMUCOSAL
  Filled 2015-12-05 (×4): qty 15

## 2015-12-05 MED ORDER — SODIUM CHLORIDE 0.9 % IV BOLUS (SEPSIS)
1000.0000 mL | Freq: Once | INTRAVENOUS | Status: AC
  Administered 2015-12-05: 1000 mL via INTRAVENOUS

## 2015-12-05 MED ORDER — EPINEPHRINE 0.3 MG/0.3ML IJ SOAJ
INTRAMUSCULAR | Status: AC
  Administered 2015-12-05: 19:00:00
  Filled 2015-12-05: qty 0.3

## 2015-12-05 MED ORDER — EPINEPHRINE 0.3 MG/0.3ML IJ SOAJ
INTRAMUSCULAR | Status: AC
  Administered 2015-12-05: 0.01 mg
  Filled 2015-12-05: qty 0.3

## 2015-12-05 MED ORDER — SODIUM CHLORIDE 0.9 % IV SOLN
1.0000 mg/h | INTRAVENOUS | Status: DC
  Administered 2015-12-05: 2 mg/h via INTRAVENOUS
  Filled 2015-12-05: qty 10

## 2015-12-05 MED ORDER — EPINEPHRINE HCL 1 MG/ML IJ SOLN
INTRAMUSCULAR | Status: AC
  Filled 2015-12-05: qty 1

## 2015-12-05 MED ORDER — FENTANYL 2500MCG IN NS 250ML (10MCG/ML) PREMIX INFUSION
25.0000 ug/h | INTRAVENOUS | Status: DC
  Administered 2015-12-05: 100 ug/h via INTRAVENOUS
  Filled 2015-12-05: qty 250

## 2015-12-05 MED ORDER — SODIUM CHLORIDE 0.9 % IV SOLN: 250.0000 mL | INTRAVENOUS | Status: DC | PRN

## 2015-12-05 MED ORDER — ONDANSETRON HCL 4 MG/2ML IJ SOLN
INTRAMUSCULAR | Status: AC
  Administered 2015-12-05: 19:00:00
  Filled 2015-12-05: qty 2

## 2015-12-05 MED ORDER — FAMOTIDINE IN NACL 20-0.9 MG/50ML-% IV SOLN
20.0000 mg | Freq: Two times a day (BID) | INTRAVENOUS | Status: DC
  Administered 2015-12-05 – 2015-12-06 (×3): 20 mg via INTRAVENOUS
  Filled 2015-12-05 (×4): qty 50

## 2015-12-05 MED ORDER — MIDAZOLAM HCL 2 MG/2ML IJ SOLN
2.0000 mg | INTRAMUSCULAR | Status: DC | PRN
  Filled 2015-12-05: qty 2

## 2015-12-05 MED ORDER — METHYLPREDNISOLONE SODIUM SUCC 125 MG IJ SOLR
INTRAMUSCULAR | Status: AC
  Administered 2015-12-05: 125 mg via INTRAVENOUS
  Filled 2015-12-05: qty 2

## 2015-12-05 MED ORDER — EPINEPHRINE 0.3 MG/0.3ML IJ SOAJ
0.2000 mg | Freq: Once | INTRAMUSCULAR | Status: AC
  Administered 2015-12-05: 0.01 mg via INTRAMUSCULAR

## 2015-12-05 MED ORDER — KETAMINE HCL 50 MG/ML IJ SOLN
INTRAMUSCULAR | Status: AC
  Administered 2015-12-05: 170 mg
  Filled 2015-12-05: qty 10

## 2015-12-05 MED ORDER — EPINEPHRINE HCL 1 MG/ML IJ SOLN
0.0000 ug/min | INTRAMUSCULAR | Status: DC
  Administered 2015-12-05: 1.5 ug/min via INTRAVENOUS
  Administered 2015-12-06: 6 ug/min via INTRAVENOUS
  Filled 2015-12-05 (×2): qty 4

## 2015-12-05 MED ORDER — PROPOFOL 1000 MG/100ML IV EMUL
INTRAVENOUS | Status: AC
  Filled 2015-12-05: qty 100

## 2015-12-05 MED ORDER — ALBUTEROL SULFATE (2.5 MG/3ML) 0.083% IN NEBU
2.5000 mg | INHALATION_SOLUTION | RESPIRATORY_TRACT | Status: DC
  Administered 2015-12-05 – 2015-12-06 (×4): 2.5 mg via RESPIRATORY_TRACT
  Filled 2015-12-05 (×3): qty 3

## 2015-12-05 MED ORDER — MIDAZOLAM HCL 2 MG/2ML IJ SOLN
2.0000 mg | INTRAMUSCULAR | Status: DC | PRN
  Administered 2015-12-05: 2 mg via INTRAVENOUS

## 2015-12-05 MED ORDER — IOPAMIDOL (ISOVUE-300) INJECTION 61%
100.0000 mL | Freq: Once | INTRAVENOUS | Status: AC | PRN
  Administered 2015-12-05: 100 mL via INTRAVENOUS

## 2015-12-05 MED ORDER — CHLORHEXIDINE GLUCONATE 0.12% ORAL RINSE (MEDLINE KIT)
15.0000 mL | Freq: Two times a day (BID) | OROMUCOSAL | Status: DC
  Filled 2015-12-05: qty 15

## 2015-12-05 MED ORDER — ALBUTEROL SULFATE (2.5 MG/3ML) 0.083% IN NEBU
INHALATION_SOLUTION | RESPIRATORY_TRACT | Status: AC
  Administered 2015-12-05: 2.5 mg via RESPIRATORY_TRACT
  Filled 2015-12-05: qty 9

## 2015-12-05 MED ORDER — METHYLPREDNISOLONE SODIUM SUCC 125 MG IJ SOLR
60.0000 mg | Freq: Four times a day (QID) | INTRAMUSCULAR | Status: DC
  Administered 2015-12-05: 125 mg via INTRAVENOUS
  Administered 2015-12-06 – 2015-12-07 (×6): 60 mg via INTRAVENOUS
  Filled 2015-12-05 (×6): qty 2

## 2015-12-05 MED ORDER — HEPARIN SODIUM (PORCINE) 5000 UNIT/ML IJ SOLN
5000.0000 [IU] | Freq: Three times a day (TID) | INTRAMUSCULAR | Status: DC
  Administered 2015-12-05 – 2015-12-07 (×5): 5000 [IU] via SUBCUTANEOUS
  Filled 2015-12-05 (×5): qty 1

## 2015-12-05 MED ORDER — IPRATROPIUM-ALBUTEROL 0.5-2.5 (3) MG/3ML IN SOLN
RESPIRATORY_TRACT | Status: AC
  Administered 2015-12-05: 3 mL
  Filled 2015-12-05: qty 9

## 2015-12-05 MED ORDER — ANTISEPTIC ORAL RINSE SOLUTION (CORINZ)
7.0000 mL | OROMUCOSAL | Status: DC
  Administered 2015-12-05: 7 mL via OROMUCOSAL
  Filled 2015-12-05 (×5): qty 7

## 2015-12-05 MED ORDER — ANTISEPTIC ORAL RINSE SOLUTION (CORINZ)
7.0000 mL | OROMUCOSAL | Status: DC
  Administered 2015-12-06 (×4): 7 mL via OROMUCOSAL
  Filled 2015-12-05 (×8): qty 7

## 2015-12-05 MED ORDER — FENTANYL BOLUS VIA INFUSION
50.0000 ug | INTRAVENOUS | Status: DC | PRN
  Filled 2015-12-05: qty 50

## 2015-12-05 MED ORDER — SODIUM CHLORIDE 0.9 % IV SOLN
INTRAVENOUS | Status: DC
  Administered 2015-12-05: 21:00:00 via INTRAVENOUS

## 2015-12-05 NOTE — Care Management (Signed)
The patient is intubated and unable to speak currently. However, the patient has opened his eyes and moved a bit. The Chaplain spoke with patients sister and checked on the family on several occassions. The Chaplain will continue rounds and follow up to check on the patient and family over the next 24 hours.

## 2015-12-05 NOTE — Progress Notes (Signed)
ETT pulled back 3cm to 24cm at the lip from 27cm at the lip. Chest xray pending.

## 2015-12-05 NOTE — H&P (Signed)
PULMONARY / CRITICAL CARE MEDICINE   Name: Robert Ball MRN: [id number] DOB: 09-25-1945    ADMISSION DATE:  12/05/2015 CONSULTATION DATE:  12/05/15  REFERRING MD: Dr. Silvano Rusk  CHIEF COMPLAINT:  Anaphylactic shock  HISTORY OF PRESENT ILLNESS:   Robert Ball is a 70 year old male with PMH significant for COPD, CAD, cardiac defibrillator in place, hyperlipidemia, GERD, depression. Apparently patient had a history of chronic diarrhea for the last past 6 months and abdominal pain. Patient went to his PCP. CT of the abdomen and pelvis was ordered with contrast. Patient went home and had supper with his wife around 16:30, wife noticed his face starts swelling. EMS was called and  Patient was brought  to [facility] ED.  Patient received 2 dose of epinephrine and albuterol treatment but his airway was compromised and therefore was intubated and on mechanical ventilation.  PAST MEDICAL HISTORY :  He  has a past medical history of Anxiety; Cardiac defibrillator in place; COPD (chronic obstructive pulmonary disease) (HCC); Depression; Elevated lipids; GERD (gastroesophageal reflux disease); Headache; Hypertension; Prostate enlargement; PTSD (post-traumatic stress disorder); and Shakes.  PAST SURGICAL HISTORY: He  has a past surgical history that includes Neck surgery; Hernia repair; Hemorrhoid surgery; Cataract extraction w/ intraocular lens  implant, bilateral (Bilateral); Eye muscle surgery (Bilateral); Icd lead removal (Left, 12/27/2014); and Back surgery.  Allergies  Allergen Reactions  . Aspirin Other (See Comments) and Itching    Pt is unable to take this medication.    . Ciprofloxacin Hcl Other (See Comments)    Reaction:  Unknown   . Ibuprofen Other (See Comments) and Itching    Pt is unable to take this medication.    . Metronidazole Other (See Comments)    Reaction:  Unknown   . Naproxen Other (See Comments)    Pt is unable to take this medication.    . Oxycodone Itching  . Tetracyclines &  Related Other (See Comments)    Reaction:  Unknown   . Ciprofloxacin Rash  . Nitrofurantoin Rash  . Sulfa Antibiotics Rash  . Tetracycline Rash    Current Facility-Administered Medications on File Prior to Encounter  Medication  . dexamethasone (DECADRON) injection 10 mg  . dexamethasone (DECADRON) injection 10 mg  . dexamethasone (DECADRON) injection 10 mg  . dexamethasone (DECADRON) injection 10 mg  . ropivacaine (PF) 2 mg/ml (0.2%) (NAROPIN) epidural 10 mL  . ropivacaine (PF) 2 mg/ml (0.2%) (NAROPIN) epidural 10 mL  . ropivacaine (PF) 2 mg/ml (0.2%) (NAROPIN) epidural 10 mL  . ropivacaine (PF) 2 mg/ml (0.2%) (NAROPIN) epidural 10 mL   Current Outpatient Prescriptions on File Prior to Encounter  Medication Sig  . acetaminophen (TYLENOL) 500 MG tablet Take 500 mg by mouth every 6 (six) hours as needed for mild pain or headache.  . albuterol (PROVENTIL HFA;VENTOLIN HFA) 108 (90 BASE) MCG/ACT inhaler Inhale 2 puffs into the lungs every 6 (six) hours as needed for wheezing or shortness of breath.  Marland Kitchen albuterol (PROVENTIL) (2.5 MG/3ML) 0.083% nebulizer solution Take 2.5 mg by nebulization every 6 (six) hours as needed for wheezing or shortness of breath.  Marland Kitchen amLODipine (NORVASC) 10 MG tablet Take 10 mg by mouth daily.  Marland Kitchen amoxicillin-clavulanate (AUGMENTIN) 875-125 MG tablet Take 1 tablet by mouth 2 (two) times daily. (Patient not taking: Reported on 07/17/2015)  . budesonide-formoterol (SYMBICORT) 160-4.5 MCG/ACT inhaler Inhale into the lungs.  . carvedilol (COREG) 12.5 MG tablet Take 12.5 mg by mouth 2 (two) times daily.   . cetirizine (  ZYRTEC) 10 MG tablet Take by mouth.  . Cholecalciferol (VITAMIN D3) 5000 UNITS CAPS Take 5,000 Units by mouth every other day. Reported on 10/31/2015  . fenofibrate 160 MG tablet Take 160 mg by mouth daily.  . furosemide (LASIX) 40 MG tablet Take 40 mg by mouth daily.  Marland Kitchen loperamide (IMODIUM) 2 MG capsule Take 4 mg by mouth daily.   Marland Kitchen omeprazole (PRILOSEC)  20 MG capsule Take 20 mg by mouth 2 (two) times daily.   . predniSONE (DELTASONE) 10 MG tablet Take 10 mg by mouth daily.  . prochlorperazine (COMPAZINE) 5 MG tablet Take 5 mg by mouth 3 (three) times daily as needed for nausea or vomiting.   . roflumilast (DALIRESP) 500 MCG TABS tablet Take 500 mcg by mouth daily.  . sertraline (ZOLOFT) 100 MG tablet Take 100 mg by mouth 2 (two) times daily. Reported on 07/17/2015  . tamsulosin (FLOMAX) 0.4 MG CAPS capsule Take 1 capsule (0.4 mg total) by mouth daily. (Patient taking differently: Take 0.4 mg by mouth 2 (two) times daily. )  . tiotropium (SPIRIVA) 18 MCG inhalation capsule Place 18 mcg into inhaler and inhale daily.  . traMADol (ULTRAM) 50 MG tablet Take 1 tablet (50 mg total) by mouth every 6 (six) hours as needed.  . ziprasidone (GEODON) 60 MG capsule Take 60 mg by mouth at bedtime.    FAMILY HISTORY:  His indicated that his mother is deceased. He indicated that his father is deceased.    SOCIAL HISTORY: He  reports that he quit smoking about 8 years ago. His smoking use included Cigarettes. He has a 200.00 pack-year smoking history. He does not have any smokeless tobacco history on file. He reports that he drinks alcohol. He reports that he does not use drugs.  REVIEW OF SYSTEMS:   Unable to obtain  SUBJECTIVE:  Unable to obtain  VITAL SIGNS: BP 92/81 (BP Location: Left Arm)   Pulse 68   Resp (!) 25   SpO2 100%   HEMODYNAMICS:    VENTILATOR SETTINGS: Vent Mode: AC FiO2 (%):  [100 %] 100 % Set Rate:  [20 bmp] 20 bmp Vt Set:  [500 mL] 500 mL PEEP:  [5 cmH20] 5 cmH20  INTAKE / OUTPUT: No intake/output data recorded.  PHYSICAL EXAMINATION: General:  Elderly, well built white male on ventilator Neuro: sedated HEENT:  Atraumatic, normocephalic, PERRLA, No JVD appreciated Lungs: congested, diffused wheezes, no crackles noted Cardiovascular: S1S2, RRR, no mrg noted Abdomen:  Obese, round, soft, nontender Musculoskeletal:   No inflammation/deformity noted Skin:  Grossly intact  LABS:  BMET No results for input(s): NA, K, CL, CO2, BUN, CREATININE, GLUCOSE in the last 168 hours.  Electrolytes No results for input(s): CALCIUM, MG, PHOS in the last 168 hours.  CBC No results for input(s): WBC, HGB, HCT, PLT in the last 168 hours.  Coag's No results for input(s): APTT, INR in the last 168 hours.  Sepsis Markers No results for input(s): LATICACIDVEN, PROCALCITON, O2SATVEN in the last 168 hours.  ABG No results for input(s): PHART, PCO2ART, PO2ART in the last 168 hours.  Liver Enzymes No results for input(s): AST, ALT, ALKPHOS, BILITOT, ALBUMIN in the last 168 hours.  Cardiac Enzymes No results for input(s): TROPONINI, PROBNP in the last 168 hours.  Glucose No results for input(s): GLUCAP in the last 168 hours.  Imaging Ct Abdomen Pelvis W Contrast  Result Date: 12/05/2015 CLINICAL DATA:  70 year old male with history of chronic diarrhea for the past 6 months. Generalized  abdominal pain. EXAM: CT ABDOMEN AND PELVIS WITH CONTRAST TECHNIQUE: Multidetector CT imaging of the abdomen and pelvis was performed using the standard protocol following bolus administration of intravenous contrast. CONTRAST:  ISOVUE-300 IOPAMIDOL (ISOVUE-300) INJECTION 61% COMPARISON:  CT the abdomen and pelvis 04/09/2015. FINDINGS: Lower chest: Pacemaker leads terminating in the right ventricular apex, right atrial appendage, and overlying the lateral wall of the left ventricle via the coronary sinus and coronary veins. Hepatobiliary: Diffuse low attenuation throughout the hepatic parenchyma, compatible with severe hepatic steatosis. No definite cystic or solid hepatic lesions. No intra or extrahepatic biliary ductal dilatation. Gallbladder is nearly decompressed, but otherwise unremarkable in appearance. Pancreas: Several coarse calcifications are noted in the head of the pancreas, likely sequela of prior episodes of pancreatitis.  No discrete pancreatic mass. No pancreatic ductal dilatation. No pancreatic or peripancreatic fluid or inflammatory changes. Spleen: Spleen is enlarged measuring 16.1 x 7.7 x 17.8 cm (estimated splenic volume of 1,103 mL). Adrenals/Urinary Tract: Bilateral adrenal glands and bilateral kidneys are normal in appearance. No hydroureteronephrosis or perinephric stranding to indicate urinary tract obstruction at this time. Urinary bladder wall is mildly thickened, somewhat asymmetrically so involving predominantly the left side and posterior aspect of the urinary bladder wall, best appreciated on image 69 of series 2. No discrete bladder wall mass is confidently identified. Stomach/Bowel: Normal appearance of the stomach. No pathologic dilatation of small bowel or colon. A few scattered colonic diverticulae are noted, without surrounding inflammatory changes to suggest an acute diverticulitis at this time. No regional areas of mural thickening or surrounding inflammatory changes are seen in associations with either the small bowel or colon. The appendix is not confidently identified and may be surgically absent. Regardless, there are no inflammatory changes noted adjacent to the cecum to suggest the presence of an acute appendicitis at this time. Vascular/Lymphatic: Aortic atherosclerosis, without evidence of aneurysm or dissection in the abdominal or pelvic vasculature. No lymphadenopathy noted in the abdomen or pelvis. Reproductive: Prostate gland and seminal vesicles are unremarkable in appearance. Other: No significant volume of ascites.  No pneumoperitoneum. Musculoskeletal: There are no aggressive appearing lytic or blastic lesions noted in the visualized portions of the skeleton. IMPRESSION: 1. No acute findings in the abdomen or pelvis to account for the patient's symptoms. 2. Asymmetric thickening of the urinary bladder wall. This is nonspecific, but somewhat unusual in appearance. While there is no discrete  urothelial lesion identified, nonemergent Urologic consultation is suggested to exclude the possibility of infiltrative bladder wall mass. 3. Mild colonic diverticulosis without evidence to suggest an acute diverticulitis at this time. 4. Severe hepatic steatosis. 5. Aortic atherosclerosis. 6. Splenomegaly. 7. Additional incidental findings, as above. Electronically Signed   By: Trudie Reed M.D.   On: 12/05/2015 13:15   Dg Chest Portable 1 View  Result Date: 12/05/2015 CLINICAL DATA:  Status post intubation. EXAM: PORTABLE CHEST 1 VIEW COMPARISON:  07/11/2015 FINDINGS: Endotracheal tube tip lies at the [employer]. Consider retracting 1-2 cm. Cardiac silhouette is partly obscured by the diaphragms. It is borderline enlarged. No mediastinal or hilar masses. Left anterior chest wall biventricular cardioverter-defibrillator is stable in well positioned. Mild basilar opacities noted most likely atelectasis. No convincing pulmonary edema or pleural effusion. No pneumothorax. IMPRESSION: 1. Endotracheal tube tip projects at the [place]. Recommend retracting 1-2 cm. 2. Bibasilar opacity is noted most likely atelectasis. No convincing pneumonia no pulmonary edema. Electronically Signed   By: Amie Portland M.D.   On: 12/05/2015 19:18     STUDIES:  8/3 CT abdomen and pelvis>>No acute findings in the abdomen or pelvis  8/10 EF-40% hypo contractile LV function 8/1 - C-diff negative CULTURES: none  ANTIBIOTICS: none  SIGNIFICANT EVENTS: 8/3>> Patient admitted to [facility] in Anaphylactic shock, intubated and mechanically ventilated  LINES/TUBES: none  DISCUSSION: 70 year old male with Anaphylactic shock with contrast dye.  ASSESSMENT / PLAN:  PULMONARY A: Anaphylactic shock secondary to  Acute respiratory failure due to anaphylactic reaction  severe-COPD- stage 2 -3liter(sees Dr. Meredeth Ide at [facility] P:   Full vent support, wean as tolerated WUA in am Fentanyl /versed for sedation Methyl  prednisone 60 mg X2 days Albuterol  Routine ABG Routine CXR  CARDIOVASCULAR A:   Mildly elevated troponin Cardiac defibrillator in SITU Chronic systolic heart failure- Hx of hyperlipidemia Hx of hypertension P:  Resuscitate with fluids Epinephrine gtt MAP goals>65 Hold Amlodipine/coreg Hold fenofibrate  Hold home dose lasix  RENAL A:   Acute on chronic Kidney disease  Hx of Hyponatremia Hx of BPH P:   Follow chemistry Replace electrolytes per ICU protocol Hold Flomax  GASTROINTESTINAL A:   Hx of chronic diarrhea Hx of abdominal pain Acute diverticulitis  Hx ofGERD P:   NPO now Will obtain GI consult famotidine   HEMATOLOGIC A:   No active issues P:  Heparin for DVT prophylaxis Transfuse if Hgb <7  INFECTIOUS A:   Leukocytosis P:   Follow lactic acid Monitor fever curve  ENDOCRINE A:   No active issues P:   Blood sugar checks intermittently   NEUROLOGIC A:   Hx Of PTSD Hx of depression/tremors  P:   RASS goal: 0 to -1 Hold Geodon/zoloft      Bincy Varughese,AG-ACNP Pulmonary and Critical Care Medicine [facility]   12/05/2015, 7:59 PM  PCCM ATTENDING ATTESTATION:  I have evaluated patient with ANP Luci Bank, reviewed database in its entirety and discussed care plan in detail. In addition, this patient was discussed on multidisciplinary rounds.   Important exam findings: RASS -1 to 0 Facial, labial, tongue swelling all appear much improved + leak test No wheezes Passed SBT  Major problems addressed by PCCM team: Anaphylaxis due to IV contrast - much improved Shock resolving Angioedema, appears resolved   PLAN/REC: Extubated this AM under my supervision Wean epinephrine gtt off for MAP > 60 mmHg Continue systemic steroids, H1RA, H2RA Nebulized steroids and bronchodilators SSI coverage  CCM time: 45 mins The above time includes time spent in consultation with patient and/or family members and reviewing care plan  on multidisciplinary rounds  Milfred Fischer, MD PCCM service Mobile [phone] Pager [phone]

## 2015-12-05 NOTE — ED Triage Notes (Signed)
Pt arrives to ED via [facility] from home d/t difficulty breathing and an allergic reaction to IV contrast dye given earlier today for a pelvic and abdominal CT scan performed at [facility].

## 2015-12-05 NOTE — ED Notes (Signed)
100 mg Succ and 70mg  Ketamine given IVP.

## 2015-12-05 NOTE — ED Provider Notes (Signed)
[facility] Emergency Department Provider Note  ____________________________________________  Time seen: Approximately 7:12 PM  I have reviewed the triage vital signs and the nursing notes.   HISTORY  Chief Complaint Allergic Reaction  Level 5 caveat:  Portions of the history and physical were unable to be obtained due to severe anaphylaxis   HPI Robert VERNO is a 70 y.o. male history of COPD, cardiac defibrillator, hypertension who presents for evaluation of anaphylaxis. Patient underwent a CT abdomen and pelvis earlier today and after going home started having swelling of his lips. He developed difficulty breathing and EMS was called. Fire department arrived and gave patient an Careers adviser. Then EMS arrived and gave patient an EpiPen. Patient was placed on oxygen and transferred to [place]. Upon arrival patient had a severe angioedema of his lips and tongue, decreased air movement, stridor, but maintaining his sats. Patient was unable to provide much history. Patient received 2 EpiPen's and was started on a epi drip and two back to back duoneb treatments with no improvement of his symptoms. Patient received  of solumedrol. For concerns of impending airway, patient was then intubated with ketamine only and glydescope successfully. After successful intubation, patient was paralyzed with succs and started on propofol drip. Patient was started on continuous albuterol. Patient was admitted to ICU in critical but stable condition.  Past Medical History:  Diagnosis Date  . Anxiety   . Cardiac defibrillator in place   . COPD (chronic obstructive pulmonary disease) (HCC)    On 2liter oxygen  . Depression   . Elevated lipids   . GERD (gastroesophageal reflux disease)   . Headache   . Hypertension   . Prostate enlargement   . PTSD (post-traumatic stress disorder)   . Shakes     Patient Active Problem List   Diagnosis Date Noted  . Anaphylactic shock  12/05/2015  . Nocturia 10/15/2015  . Arteriosclerosis of coronary artery 07/25/2015  . Benign prostatic hyperplasia with urinary obstruction 07/03/2015  . Chronic LBP 04/16/2015  . Abdominal pain 04/09/2015  . Clinical depression 01/31/2015  . Other long term (current) drug therapy 01/31/2015  . 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 Chest pain 01/02/2015  . Venous insufficiency of leg 01/02/2015  . Pulmonary hypertension (HCC) 03/20/2014  . Chronic systolic heart failure (HCC) 03/20/2014  . Allergic rhinitis 01/25/2014  . Back pain, chronic 01/25/2014  . Chronic diarrhea 01/25/2014  . Chronic kidney disease (CKD), stage III (moderate) 01/25/2014  . Benign essential HTN 01/25/2014  . Combined fat and carbohydrate induced hyperlipemia 01/25/2014  . Has a tremor 01/25/2014  . Oxygen desaturation 12/28/2013  . Subclinical hypothyroidism 12/28/2012  . Encounter for issue of repeat prescription 06/23/2012  . Acid reflux 12/17/2011  . Neurosis, posttraumatic 12/17/2011  . Persons encountering health services in other specified circumstances 12/17/2011  . Encounter for other specified special examinations 12/17/2011  . Colon polyp 12/10/2011  . Chronic obstructive pulmonary disease (HCC) 12/10/2011  . Gout 12/10/2011  . History of biliary T-tube placement 12/10/2011    Past Surgical History:  Procedure Laterality Date  . BACK SURGERY    . CATARACT EXTRACTION W/ INTRAOCULAR LENS  IMPLANT, BILATERAL Bilateral   . EYE MUSCLE SURGERY Bilateral   . HEMORRHOID SURGERY    . HERNIA REPAIR    . ICD LEAD REMOVAL Left 12/27/2014   Procedure: ICD LEAD REMOVAL/ICD GENERATOR CHANGE OUT;  Surgeon: Marcina Millard, MD;  Location: [facility] ORS;  Service: Cardiovascular;  Laterality: Left;  .  NECK SURGERY      Prior to Admission medications   Medication Sig Start Date End Date Taking? Authorizing Provider  acetaminophen (TYLENOL) 500 MG tablet Take 500 mg by mouth every 6 (six) hours as  needed for mild pain or headache.    Historical Provider, MD  albuterol (PROVENTIL HFA;VENTOLIN HFA) 108 (90 BASE) MCG/ACT inhaler Inhale 2 puffs into the lungs every 6 (six) hours as needed for wheezing or shortness of breath.    Historical Provider, MD  albuterol (PROVENTIL) (2.5 MG/3ML) 0.083% nebulizer solution Take 2.5 mg by nebulization every 6 (six) hours as needed for wheezing or shortness of breath.    Historical Provider, MD  amLODipine (NORVASC) 10 MG tablet Take 10 mg by mouth daily.    Historical Provider, MD  amoxicillin-clavulanate (AUGMENTIN) 875-125 MG tablet Take 1 tablet by mouth 2 (two) times daily. Patient not taking: Reported on 07/17/2015 07/10/15   [name], MD  budesonide-formoterol [facility]) 160-4.5 MCG/ACT inhaler Inhale into the lungs.    Historical Provider, MD  carvedilol (COREG) 12.5 MG tablet Take 12.5 mg by mouth 2 (two) times daily.     Historical Provider, MD  cetirizine (ZYRTEC) 10 MG tablet Take by mouth. 07/26/14   Historical Provider, MD  Cholecalciferol (VITAMIN D3) 5000 UNITS CAPS Take 5,000 Units by mouth every other day. Reported on 10/31/2015    Historical Provider, MD  fenofibrate 160 MG tablet Take 160 mg by mouth daily.    Historical Provider, MD  furosemide (LASIX) 40 MG tablet Take 40 mg by mouth daily.    Historical Provider, MD  loperamide (IMODIUM) 2 MG capsule Take 4 mg by mouth daily.     Historical Provider, MD  omeprazole (PRILOSEC) 20 MG capsule Take 20 mg by mouth 2 (two) times daily.     Historical Provider, MD  predniSONE (DELTASONE) 10 MG tablet Take 10 mg by mouth daily. 09/20/15   Historical Provider, MD  prochlorperazine (COMPAZINE) 5 MG tablet Take 5 mg by mouth 3 (three) times daily as needed for nausea or vomiting.     Historical Provider, MD  roflumilast (DALIRESP) 500 MCG TABS tablet Take 500 mcg by mouth daily.    Historical Provider, MD  sertraline (ZOLOFT) 100 MG tablet Take 100 mg by mouth 2 (two) times daily. Reported on  07/17/2015    Historical Provider, MD  tamsulosin (FLOMAX) 0.4 MG CAPS capsule Take 1 capsule (0.4 mg total) by mouth daily. Patient taking differently: Take 0.4 mg by mouth 2 (two) times daily.  07/09/15   Crist Fat, MD  tiotropium (SPIRIVA) 18 MCG inhalation capsule Place 18 mcg into inhaler and inhale daily.    Historical Provider, MD  traMADol (ULTRAM) 50 MG tablet Take 1 tablet (50 mg total) by mouth every 6 (six) hours as needed. 07/17/15   Yevette Edwards, MD  ziprasidone (GEODON) 60 MG capsule Take 60 mg by mouth at bedtime.    Historical Provider, MD    Allergies Aspirin; Ciprofloxacin hcl; Ibuprofen; Metronidazole; Naproxen; Oxycodone; Tetracyclines & related; Ciprofloxacin; Nitrofurantoin; Sulfa antibiotics; and Tetracycline  Family History  Problem Relation Age of Onset  . Heart disease Mother   . Heart disease Father   . Lymphoma Father     Social History Social History  Substance Use Topics  . Smoking status: Former Smoker    Packs/day: 5.00    Years: 40.00    Types: Cigarettes    Quit date: 09/24/2007  . Smokeless tobacco: Not on file  .  Alcohol use 0.0 oz/week     Comment: daily, beer and hard liquor    Review of Systems  Constitutional: Negative for fever. Eyes: Negative for visual changes. ENT: Negative for sore throat. + angioedema Cardiovascular: Negative for chest pain. Respiratory: + shortness of breath, wheezing, stridor Gastrointestinal: Negative for abdominal pain, vomiting or diarrhea. Genitourinary: Negative for dysuria. Musculoskeletal: Negative for back pain. Skin: Negative for rash. Neurological: Negative for headaches, weakness or numbness.  ____________________________________________   PHYSICAL EXAM:  VITAL SIGNS: 20 -- 100 % -- 74 Monitor 75  86/63 Automatic 71   Constitutional: Awake and in moderate respiratory distress. HEENT:      Head: Normocephalic and atraumatic.         Eyes: Conjunctivae are normal. Sclera is  non-icteric. EOMI. PERRL      Mouth/Throat: Severe angioedema of the uvula, tongue, lower lip      Neck: Supple with no signs of meningismus. Cardiovascular: Regular rate and rhythm. No murmurs, gallops, or rubs. 2+ symmetrical distal pulses are present in all extremities. No JVD. Respiratory: Wheezing, stridor, diminished air movement bilaterally  Gastrointestinal: Soft, non tender, and non distended with positive bowel sounds. No rebound or guarding. Musculoskeletal: Nontender with normal range of motion in all extremities. No edema, cyanosis, or erythema of extremities. Neurologic: Normal speech and language. Face is symmetric. Moving all extremities. No gross focal neurologic deficits are appreciated. Skin: Skin is warm, dry and intact. No rash noted. Psychiatric: Mood and affect are normal. Speech and behavior are normal.  ____________________________________________   LABS (all labs ordered are listed, but only abnormal results are displayed)  Labs Reviewed  BLOOD GAS, ARTERIAL - Abnormal; Notable for the following:       Result Value   pH, Arterial 7.25 (*)    pO2, Arterial 263 (*)    Bicarbonate 18.4 (*)    Acid-base deficit 8.5 (*)    Allens test (pass/fail) POSITIVE (*)    All other components within normal limits  CBC - Abnormal; Notable for the following:    WBC 27.0 (*)    All other components within normal limits  CREATININE, SERUM - Abnormal; Notable for the following:    Creatinine, Ser 1.93 (*)    GFR calc non Af Amer 33 (*)    GFR calc Af Amer 38 (*)    All other components within normal limits  GLUCOSE, CAPILLARY - Abnormal; Notable for the following:    Glucose-Capillary 211 (*)    All other components within normal limits  BASIC METABOLIC PANEL - Abnormal; Notable for the following:    Sodium 132 (*)    Chloride 100 (*)    Glucose, Bld 218 (*)    Creatinine, Ser 1.88 (*)    GFR calc non Af Amer 34 (*)    GFR calc Af Amer 39 (*)    All other components  within normal limits  MRSA PCR SCREENING  LACTIC ACID, PLASMA  CBC  BASIC METABOLIC PANEL  BLOOD GAS, ARTERIAL  MAGNESIUM  PHOSPHORUS  LACTIC ACID, PLASMA   ____________________________________________  EKG  none ____________________________________________  RADIOLOGY  CXR: 1. Endotracheal tube tip projects at the [employer]. Recommend retracting 1-2 cm. 2. Bibasilar opacity is noted most likely atelectasis. No convincing pneumonia no pulmonary edema ____________________________________________   PROCEDURES  Procedure(s) performed:yes .Intubation Date/Time: 12/05/2015 7:16 PM Performed by: Nita Sickle Authorized by: Nita Sickle   Consent:    Consent obtained:  Verbal   Consent given by:  Patient  Risks discussed:  Hypoxia and death Pre-procedure details:    Patient status:  Awake   Paralytics:  None Procedure details:    Preoxygenation:  Nonrebreather mask   CPR in progress: no     Intubation method:  Oral   Oral intubation technique:  Video-assisted   Laryngoscope blade:  Mac 3   Tube size (mm):  7.0   Tube type:  Cuffed   Number of attempts:  1   Ventilation between attempts: no     Cricoid pressure: no     Tube visualized through cords: yes   Placement assessment:    ETT to lip:  23   Tube secured with:  ETT holder   Breath sounds:  Equal and absent over the epigastrium   Placement verification: chest rise, CXR verification, equal breath sounds and ETCO2 detector     CXR findings:  ETT in proper place Post-procedure details:    Patient tolerance of procedure:  Tolerated well, no immediate complications       CRITICAL CARE Performed by: Nita Sickle   Total critical care time: 60 minutes  Critical care time was exclusive of separately billable procedures and treating other patients.  Critical care was necessary to treat or prevent imminent or life-threatening deterioration.  Critical care was time spent personally by me on  the following activities: development of treatment plan with patient and/or surrogate as well as nursing, discussions with consultants, evaluation of patient's response to treatment, examination of patient, obtaining history from patient or surrogate, ordering and performing treatments and interventions, ordering and review of laboratory studies, ordering and review of radiographic studies, pulse oximetry and re-evaluation of patient's condition.    ____________________________________________   INITIAL IMPRESSION / ASSESSMENT AND PLAN / ED COURSE  70 y.o. male history of COPD, cardiac defibrillator, hypertension who presents for evaluation of anaphylaxis. Patient underwent a CT abdomen and pelvis earlier today and after going home started having swelling of his lips. He developed difficulty breathing and EMS was called. Fire department arrived and gave patient an Careers adviser. Then EMS arrived and gave patient an EpiPen. Patient was placed on oxygen and transferred to [place]. Upon arrival patient had a severe angioedema of his lips and tongue, decreased air movement, stridor, but maintaining his sats. Patient was unable to provide much history. Patient received 2 EpiPen's and was started on a epi drip and two back to back duoneb treatments with no improvement of his symptoms. Patient received 125mg  of solumedrol. For concerns of impending airway, patient was then intubated with ketamine only and glydescope successfully. After successful intubation, patient was paralyzed with succs and started on propofol drip. Patient was started on continuous albuterol. Patient was admitted to ICU in critical but stable condition.   Clinical Course    Pertinent labs & imaging results that were available during my care of the patient were reviewed by me and considered in my medical decision making (see chart for details).    ____________________________________________   FINAL CLINICAL IMPRESSION(S) / ED  DIAGNOSES  Final diagnoses:  Anaphylactic shock, initial encounter  Acute respiratory failure     NEW MEDICATIONS STARTED DURING THIS VISIT:  Current Discharge Medication List       Note:  This document was prepared using Dragon voice recognition software and may include unintentional dictation errors.    Nita Sickle, MD 12/05/15 [phone]

## 2015-12-05 NOTE — ED Notes (Signed)
Propofol bolus of given.

## 2015-12-06 ENCOUNTER — Inpatient Hospital Stay: Payer: Medicare PPO

## 2015-12-06 LAB — GLUCOSE, CAPILLARY
GLUCOSE-CAPILLARY: 247 mg/dL — AB (ref 65–99)
GLUCOSE-CAPILLARY: 229 mg/dL — AB (ref 65–99)
Glucose-Capillary: 263 mg/dL — ABNORMAL HIGH (ref 65–99)

## 2015-12-06 LAB — BLOOD GAS, ARTERIAL
ACID-BASE DEFICIT: 0.5 mmol/L (ref 0.0–2.0)
Acid-base deficit: 10.1 mmol/L — ABNORMAL HIGH (ref 0.0–2.0)
BICARBONATE: 23.1 meq/L (ref 21.0–28.0)
Bicarbonate: 16.7 mEq/L — ABNORMAL LOW (ref 21.0–28.0)
FIO2: 0.5
FIO2: 0.32
MECHANICAL RATE: 20
O2 SAT: 95.9 %
O2 Saturation: 98.3 %
PATIENT TEMPERATURE: 37
PATIENT TEMPERATURE: 37
PEEP/CPAP: 5 cmH2O
PO2 ART: 78 mmHg — AB (ref 83.0–108.0)
VT: 500 mL
pCO2 arterial: 39 mmHg (ref 32.0–48.0)
pCO2 arterial: 34 mmHg (ref 32.0–48.0)
pH, Arterial: 7.24 — ABNORMAL LOW (ref 7.350–7.450)
pH, Arterial: 7.44 (ref 7.350–7.450)
pO2, Arterial: 127 mmHg — ABNORMAL HIGH (ref 83.0–108.0)

## 2015-12-06 LAB — BASIC METABOLIC PANEL
Anion gap: 8 (ref 5–15)
BUN: 15 mg/dL (ref 6–20)
CO2: 21 mmol/L — ABNORMAL LOW (ref 22–32)
CREATININE: 2 mg/dL — AB (ref 0.61–1.24)
Calcium: 8.5 mg/dL — ABNORMAL LOW (ref 8.9–10.3)
Chloride: 105 mmol/L (ref 101–111)
GFR calc Af Amer: 37 mL/min — ABNORMAL LOW (ref >60)
GFR, EST NON AFRICAN AMERICAN: 32 mL/min — AB (ref >60)
Glucose, Bld: 253 mg/dL — ABNORMAL HIGH (ref 65–99)
POTASSIUM: 4.7 mmol/L (ref 3.5–5.1)
SODIUM: 134 mmol/L — AB (ref 135–145)

## 2015-12-06 LAB — PHOSPHORUS
PHOSPHORUS: 2 mg/dL — AB (ref 2.5–4.6)
Phosphorus: 3.6 mg/dL (ref 2.5–4.6)

## 2015-12-06 LAB — PROCALCITONIN: PROCALCITONIN: 0.91 ng/mL

## 2015-12-06 LAB — CBC
HCT: 45.1 % (ref 40.0–52.0)
Hemoglobin: 15.4 g/dL (ref 13.0–18.0)
MCH: 31.7 pg (ref 26.0–34.0)
MCHC: 34.2 g/dL (ref 32.0–36.0)
MCV: 92.6 fL (ref 80.0–100.0)
PLATELETS: 247 10*3/uL (ref 150–440)
RBC: 4.87 MIL/uL (ref 4.40–5.90)
RDW: 14.3 % (ref 11.5–14.5)
WBC: 24.4 10*3/uL — ABNORMAL HIGH (ref 3.8–10.6)

## 2015-12-06 LAB — BASIC METABOLIC PANEL WITH GFR
Anion gap: 9 (ref 5–15)
BUN: 18 mg/dL (ref 6–20)
CO2: 20 mmol/L — ABNORMAL LOW (ref 22–32)
Calcium: 8.5 mg/dL — ABNORMAL LOW (ref 8.9–10.3)
Chloride: 106 mmol/L (ref 101–111)
Creatinine, Ser: 1.47 mg/dL — ABNORMAL HIGH (ref 0.61–1.24)
GFR calc Af Amer: 53 mL/min — ABNORMAL LOW
GFR calc non Af Amer: 46 mL/min — ABNORMAL LOW
Glucose, Bld: 110 mg/dL — ABNORMAL HIGH (ref 65–99)
Potassium: 4.2 mmol/L (ref 3.5–5.1)
Sodium: 135 mmol/L (ref 135–145)

## 2015-12-06 LAB — MAGNESIUM
MAGNESIUM: 1.6 mg/dL — AB (ref 1.7–2.4)
Magnesium: 1.6 mg/dL — ABNORMAL LOW (ref 1.7–2.4)

## 2015-12-06 LAB — AMMONIA: AMMONIA: 28 umol/L (ref 9–35)

## 2015-12-06 LAB — LACTIC ACID, PLASMA: LACTIC ACID, VENOUS: 3.8 mmol/L — AB (ref 0.5–1.9)

## 2015-12-06 MED ORDER — FENOFIBRATE 160 MG PO TABS
160.0000 mg | ORAL_TABLET | Freq: Every day | ORAL | Status: DC
  Administered 2015-12-06 – 2015-12-11 (×6): 160 mg via ORAL
  Filled 2015-12-06 (×6): qty 1

## 2015-12-06 MED ORDER — BUDESONIDE 0.25 MG/2ML IN SUSP
0.2500 mg | Freq: Four times a day (QID) | RESPIRATORY_TRACT | Status: DC
  Administered 2015-12-06 – 2015-12-10 (×14): 0.25 mg via RESPIRATORY_TRACT
  Filled 2015-12-06 (×15): qty 2

## 2015-12-06 MED ORDER — VITAMIN D 1000 UNITS PO TABS
5000.0000 [IU] | ORAL_TABLET | ORAL | Status: DC
Start: 2015-12-06 — End: 2015-12-11
  Administered 2015-12-06 – 2015-12-10 (×3): 5000 [IU] via ORAL
  Filled 2015-12-06 (×4): qty 5

## 2015-12-06 MED ORDER — TIOTROPIUM BROMIDE MONOHYDRATE 18 MCG IN CAPS
18.0000 ug | ORAL_CAPSULE | Freq: Every day | RESPIRATORY_TRACT | Status: DC
  Administered 2015-12-06 – 2015-12-11 (×6): 18 ug via RESPIRATORY_TRACT
  Filled 2015-12-06: qty 5

## 2015-12-06 MED ORDER — IPRATROPIUM-ALBUTEROL 0.5-2.5 (3) MG/3ML IN SOLN
3.0000 mL | Freq: Four times a day (QID) | RESPIRATORY_TRACT | Status: DC
  Administered 2015-12-06 – 2015-12-07 (×4): 3 mL via RESPIRATORY_TRACT
  Filled 2015-12-06 (×4): qty 3

## 2015-12-06 MED ORDER — ROFLUMILAST 500 MCG PO TABS
500.0000 ug | ORAL_TABLET | Freq: Every day | ORAL | Status: DC
  Administered 2015-12-06 – 2015-12-11 (×6): 500 ug via ORAL
  Filled 2015-12-06 (×6): qty 1

## 2015-12-06 MED ORDER — CETYLPYRIDINIUM CHLORIDE 0.05 % MT LIQD: 7.0000 mL | Freq: Two times a day (BID) | OROMUCOSAL | Status: DC

## 2015-12-06 MED ORDER — SODIUM PHOSPHATES 45 MMOLE/15ML IV SOLN
30.0000 mmol | Freq: Once | INTRAVENOUS | Status: DC
  Filled 2015-12-06: qty 10

## 2015-12-06 MED ORDER — DIPHENHYDRAMINE HCL 50 MG/ML IJ SOLN
12.5000 mg | Freq: Once | INTRAMUSCULAR | Status: AC
  Administered 2015-12-06: 12.5 mg via INTRAVENOUS
  Filled 2015-12-06: qty 1

## 2015-12-06 MED ORDER — SODIUM CHLORIDE 0.9 % IV BOLUS (SEPSIS)
500.0000 mL | Freq: Once | INTRAVENOUS | Status: AC
  Administered 2015-12-06: 500 mL via INTRAVENOUS

## 2015-12-06 MED ORDER — LORAZEPAM 2 MG/ML IJ SOLN
1.0000 mg | Freq: Once | INTRAMUSCULAR | Status: AC
  Administered 2015-12-06: 1 mg via INTRAVENOUS
  Filled 2015-12-06: qty 1

## 2015-12-06 MED ORDER — TAMSULOSIN HCL 0.4 MG PO CAPS
0.4000 mg | ORAL_CAPSULE | Freq: Every day | ORAL | Status: DC
  Administered 2015-12-06 – 2015-12-11 (×6): 0.4 mg via ORAL
  Filled 2015-12-06 (×6): qty 1

## 2015-12-06 MED ORDER — SERTRALINE HCL 100 MG PO TABS
100.0000 mg | ORAL_TABLET | Freq: Two times a day (BID) | ORAL | Status: DC
  Administered 2015-12-06 – 2015-12-11 (×10): 100 mg via ORAL
  Filled 2015-12-06 (×10): qty 1

## 2015-12-06 MED ORDER — DIPHENHYDRAMINE HCL 50 MG/ML IJ SOLN
12.5000 mg | Freq: Four times a day (QID) | INTRAMUSCULAR | Status: DC
  Administered 2015-12-06 (×2): 12.5 mg via INTRAVENOUS
  Filled 2015-12-06 (×2): qty 1

## 2015-12-06 MED ORDER — ALBUTEROL SULFATE (2.5 MG/3ML) 0.083% IN NEBU
2.5000 mg | INHALATION_SOLUTION | RESPIRATORY_TRACT | Status: DC | PRN
  Administered 2015-12-06: 2.5 mg via RESPIRATORY_TRACT
  Filled 2015-12-06: qty 3

## 2015-12-06 MED ORDER — INSULIN ASPART 100 UNIT/ML ~~LOC~~ SOLN
0.0000 [IU] | SUBCUTANEOUS | Status: DC
  Administered 2015-12-06: 3 [IU] via SUBCUTANEOUS
  Administered 2015-12-06: 2 [IU] via SUBCUTANEOUS
  Administered 2015-12-06 (×2): 5 [IU] via SUBCUTANEOUS
  Administered 2015-12-07: 2 [IU] via SUBCUTANEOUS
  Filled 2015-12-06: qty 5
  Filled 2015-12-06: qty 3
  Filled 2015-12-06: qty 2
  Filled 2015-12-06: qty 5
  Filled 2015-12-06: qty 2

## 2015-12-06 MED ORDER — MAGNESIUM SULFATE 2 GM/50ML IV SOLN
2.0000 g | Freq: Once | INTRAVENOUS | Status: AC
  Administered 2015-12-06: 2 g via INTRAVENOUS
  Filled 2015-12-06: qty 50

## 2015-12-06 MED ORDER — ZIPRASIDONE MESYLATE 20 MG IM SOLR
40.0000 mg | Freq: Once | INTRAMUSCULAR | Status: AC
  Administered 2015-12-06: 40 mg via INTRAMUSCULAR
  Filled 2015-12-06 (×2): qty 40

## 2015-12-06 MED ORDER — POTASSIUM PHOSPHATES 15 MMOLE/5ML IV SOLN
10.0000 mmol | Freq: Once | INTRAVENOUS | Status: AC
  Administered 2015-12-06: 10 mmol via INTRAVENOUS
  Filled 2015-12-06: qty 3.33

## 2015-12-06 NOTE — Progress Notes (Signed)
Extubated per MD order without complications 

## 2015-12-06 NOTE — Progress Notes (Signed)
Refused to wear humidified face tent.States he cannot tolerate any type of mask

## 2015-12-06 NOTE — Progress Notes (Signed)
eLink Physician-Brief Progress Note Patient Name: Robert Ball DOB: 03-13-1946 MRN: [id number]   Date of Service  12/06/2015  HPI/Events of Note  Hyperglycemia  eICU Interventions  Placed on q4 hour SSI - moderate      Intervention Category Intermediate Interventions: Hyperglycemia - evaluation and treatment  DETERDING,ELIZABETH 12/06/2015, 2:42 AM

## 2015-12-06 NOTE — Progress Notes (Signed)
Patient requested that foley catheter remain in place.  Will continue to monitor patient.

## 2015-12-06 NOTE — Progress Notes (Addendum)
Inpatient Diabetes Program Recommendations  AACE/ADA: New Consensus Statement on Inpatient Glycemic Control (2015)  Target Ranges:  Prepandial:   less than 140 mg/dL      Peak postprandial:   less than 180 mg/dL (1-2 hours)      Critically ill patients:  140 - 180 mg/dL   Lab Results  Component Value Date   GLUCAP 229 (H) 12/06/2015    Review of Glycemic Control  Results for Robert Ball, Robert Ball (MRN [id number]) as of 12/06/2015 11:36  Ref. Range 12/05/2015 20:45 12/06/2015 02:35 12/06/2015 07:29 12/06/2015 08:54  Glucose-Capillary Latest Ref Range: 65 - 99 mg/dL 858 (H) 850 (H) 277 (H) 229 (H)    Diabetes history: none noted Outpatient Diabetes medications: none Current orders for Inpatient glycemic control: Novolog 0-15 units q4h  Inpatient Diabetes Program Recommendations:  Agree with current orders for blood sugar management  Susette Racer, RN, [place], [place], CDE Diabetes Coordinator Inpatient Diabetes Program  [phone] (Team Pager) [phone] [facility] Office) 12/06/2015 11:40 AM

## 2015-12-06 NOTE — Progress Notes (Signed)
eLink Physician-Brief Progress Note Patient Name: Robert Ball DOB: 1945-09-30 MRN: [id number]   Date of Service  12/06/2015  HPI/Events of Note  Multiple issues 1) anxiety 2) Requesting home meds (geodon and spiriva) 3) dyspnea  eICU Interventions  Restart geodon 40mg  Ativan x1 dose Restart spiriva Face tent, humidified air CXR Restart Spiriva     Intervention Category Intermediate Interventions: Respiratory distress - evaluation and management Minor Interventions: Routine modifications to care plan (e.g. PRN medications for pain, fever);Agitation / anxiety - evaluation and management  [name] 12/06/2015, 5:34 PM

## 2015-12-06 NOTE — Progress Notes (Signed)
Initial Nutrition Assessment  DOCUMENTATION CODES:   Not applicable  INTERVENTION:  -Await diet progression, pt may benefit from GI soft diet, at least initially due to report of abdominal pain and diarrhea for 6 months prior to admission -If po intake inadequate, recommend addition of nutritional supplement   NUTRITION DIAGNOSIS:   Inadequate oral intake related to acute illness  as evidenced by NPO status  GOAL:   Patient will meet greater than or equal to 90% of their needs  MONITOR:   Diet advancement, PO intake, Weight trends, Labs  REASON FOR ASSESSMENT:   Rounds    ASSESSMENT:    70 yo male admitted with anaphylatic shock from contrast dye requiring intubation  Pt extubated today  Pt nodding in and out on visit today, wife at bedside. Wife reports pt appetite has been good; pt typically eats multiple times throughout the day. Pt eats toast or biscuit with coffee for breakfast, sometimes a sandwich around lunch time. He typically eats 25-50% of what his wife serves at dinner, but will eat the rest some time during the night. Pt also generally eats another snack during the night. Reports wt has been stable.   Reports abdominal pain, diarrhea and some N/V for about 6 months. Noted CT abdomen performed just prior to admission with no acute findings in abdomen or pelvis   Past Medical History:  Diagnosis Date  . Anxiety   . Cardiac defibrillator in place   . COPD (chronic obstructive pulmonary disease) (HCC)    On 2liter oxygen  . Depression   . Elevated lipids   . GERD (gastroesophageal reflux disease)   . Headache   . Hypertension   . Prostate enlargement   . PTSD (post-traumatic stress disorder)   . Shakes    Diet Order:  Diet NPO time specified  Skin:  Reviewed, no issues  Last BM:  no documented BM   Labs:   Glucose Profile:   Recent Labs  12/06/15 0235 12/06/15 0729 12/06/15 0854  GLUCAP 247* 263* 229*   Meds: reviewed  Height:   Ht  Readings from Last 1 Encounters:  12/05/15 5\' 10"  (1.778 m)    Weight:   Wt Readings from Last 1 Encounters:  12/06/15 190 lb 14.7 oz (86.6 kg)    Ideal Body Weight:     BMI:  Body mass index is 27.39 kg/m.  Estimated Nutritional Needs:   Kcal:  2165-2500 kcals  Protein:  87-104 g  Fluid:  >/= 2 L  EDUCATION NEEDS:   No education needs identified at this time Dictation #1 [id number]  [id number]   Romelle Starcher MS, RD, LDN [phone] Pager  [phone] Weekend/On-Call Pager

## 2015-12-06 NOTE — Progress Notes (Signed)
eLink Physician-Brief Progress Note Patient Name: Robert Ball DOB: Nov 07, 1945 MRN: [id number]   Date of Service  12/06/2015  HPI/Events of Note  Hypophosphatemia and hypomag  eICU Interventions  Phos and mag replaced     Intervention Category Intermediate Interventions: Electrolyte abnormality - evaluation and management  Jaime Grizzell 12/06/2015, 10:29 PM

## 2015-12-06 NOTE — Progress Notes (Signed)
MD informed of pt lactic acid of 3.8, new orders given.

## 2015-12-07 ENCOUNTER — Encounter: Payer: Self-pay | Admitting: *Deleted

## 2015-12-07 ENCOUNTER — Inpatient Hospital Stay: Payer: Medicare PPO

## 2015-12-07 DIAGNOSIS — J449 Chronic obstructive pulmonary disease, unspecified: Secondary | ICD-10-CM

## 2015-12-07 DIAGNOSIS — R1084 Generalized abdominal pain: Secondary | ICD-10-CM

## 2015-12-07 DIAGNOSIS — T782XXD Anaphylactic shock, unspecified, subsequent encounter: Secondary | ICD-10-CM

## 2015-12-07 LAB — COMPREHENSIVE METABOLIC PANEL
ALT: 15 U/L — ABNORMAL LOW (ref 17–63)
ANION GAP: 13 (ref 5–15)
AST: 57 U/L — ABNORMAL HIGH (ref 15–41)
Albumin: 3.4 g/dL — ABNORMAL LOW (ref 3.5–5.0)
Alkaline Phosphatase: 21 U/L — ABNORMAL LOW (ref 38–126)
BUN: 19 mg/dL (ref 6–20)
CALCIUM: 8.4 mg/dL — AB (ref 8.9–10.3)
CHLORIDE: 101 mmol/L (ref 101–111)
CO2: 19 mmol/L — AB (ref 22–32)
Creatinine, Ser: 1.45 mg/dL — ABNORMAL HIGH (ref 0.61–1.24)
GFR, EST AFRICAN AMERICAN: 54 mL/min — AB (ref >60)
GFR, EST NON AFRICAN AMERICAN: 47 mL/min — AB (ref >60)
Glucose, Bld: 116 mg/dL — ABNORMAL HIGH (ref 65–99)
Potassium: 4.1 mmol/L (ref 3.5–5.1)
SODIUM: 133 mmol/L — AB (ref 135–145)
Total Bilirubin: 1.6 mg/dL — ABNORMAL HIGH (ref 0.3–1.2)
Total Protein: 6.5 g/dL (ref 6.5–8.1)

## 2015-12-07 LAB — HEPATIC FUNCTION PANEL
ALBUMIN: 3.4 g/dL — AB (ref 3.5–5.0)
ALK PHOS: 21 U/L — AB (ref 38–126)
ALT: 12 U/L — ABNORMAL LOW (ref 17–63)
AST: 45 U/L — AB (ref 15–41)
BILIRUBIN DIRECT: 0.3 mg/dL (ref 0.1–0.5)
BILIRUBIN TOTAL: 1 mg/dL (ref 0.3–1.2)
Indirect Bilirubin: 0.7 mg/dL (ref 0.3–0.9)
Total Protein: 6.2 g/dL — ABNORMAL LOW (ref 6.5–8.1)

## 2015-12-07 LAB — CBC
HCT: 37 % — ABNORMAL LOW (ref 40.0–52.0)
Hemoglobin: 12.8 g/dL — ABNORMAL LOW (ref 13.0–18.0)
MCH: 31.5 pg (ref 26.0–34.0)
MCHC: 34.4 g/dL (ref 32.0–36.0)
MCV: 91.4 fL (ref 80.0–100.0)
PLATELETS: 113 10*3/uL — AB (ref 150–440)
RBC: 4.05 MIL/uL — ABNORMAL LOW (ref 4.40–5.90)
RDW: 13.7 % (ref 11.5–14.5)
WBC: 7.3 10*3/uL (ref 3.8–10.6)

## 2015-12-07 LAB — GLUCOSE, CAPILLARY
GLUCOSE-CAPILLARY: 118 mg/dL — AB (ref 65–99)
GLUCOSE-CAPILLARY: 110 mg/dL — AB (ref 65–99)
GLUCOSE-CAPILLARY: 80 mg/dL (ref 65–99)

## 2015-12-07 LAB — AMYLASE: Amylase: 389 U/L — ABNORMAL HIGH (ref 28–100)

## 2015-12-07 LAB — LIPASE, BLOOD: Lipase: 189 U/L — ABNORMAL HIGH (ref 11–51)

## 2015-12-07 LAB — PROCALCITONIN: Procalcitonin: 0.91 ng/mL

## 2015-12-07 MED ORDER — LORAZEPAM 1 MG PO TABS
1.0000 mg | ORAL_TABLET | Freq: Four times a day (QID) | ORAL | Status: AC | PRN
  Administered 2015-12-07 – 2015-12-10 (×3): 1 mg via ORAL
  Filled 2015-12-07 (×3): qty 1

## 2015-12-07 MED ORDER — INSULIN ASPART 100 UNIT/ML ~~LOC~~ SOLN
0.0000 [IU] | Freq: Three times a day (TID) | SUBCUTANEOUS | Status: DC
  Administered 2015-12-08: 2 [IU] via SUBCUTANEOUS
  Administered 2015-12-09: 3 [IU] via SUBCUTANEOUS
  Administered 2015-12-10: 2 [IU] via SUBCUTANEOUS
  Administered 2015-12-10: 3 [IU] via SUBCUTANEOUS
  Administered 2015-12-10: 2 [IU] via SUBCUTANEOUS
  Administered 2015-12-11: 3 [IU] via SUBCUTANEOUS
  Filled 2015-12-07 (×2): qty 2
  Filled 2015-12-07: qty 3
  Filled 2015-12-07 (×2): qty 2
  Filled 2015-12-07 (×2): qty 3

## 2015-12-07 MED ORDER — MOMETASONE FURO-FORMOTEROL FUM 200-5 MCG/ACT IN AERO
2.0000 | INHALATION_SPRAY | Freq: Two times a day (BID) | RESPIRATORY_TRACT | Status: DC
  Administered 2015-12-07 – 2015-12-11 (×9): 2 via RESPIRATORY_TRACT
  Filled 2015-12-07: qty 8.8

## 2015-12-07 MED ORDER — IPRATROPIUM-ALBUTEROL 0.5-2.5 (3) MG/3ML IN SOLN
3.0000 mL | RESPIRATORY_TRACT | Status: DC | PRN
  Administered 2015-12-08 – 2015-12-10 (×2): 3 mL via RESPIRATORY_TRACT
  Filled 2015-12-07 (×3): qty 3

## 2015-12-07 MED ORDER — PHENOL 1.4 % MT LIQD
1.0000 | OROMUCOSAL | Status: DC | PRN
  Administered 2015-12-07 (×2): 1 via OROMUCOSAL
  Filled 2015-12-07: qty 177

## 2015-12-07 MED ORDER — FUROSEMIDE 40 MG PO TABS
40.0000 mg | ORAL_TABLET | Freq: Every day | ORAL | Status: DC
  Administered 2015-12-07 – 2015-12-10 (×4): 40 mg via ORAL
  Filled 2015-12-07 (×4): qty 1

## 2015-12-07 MED ORDER — DIPHENHYDRAMINE HCL 50 MG/ML IJ SOLN: 12.5000 mg | Freq: Four times a day (QID) | INTRAMUSCULAR | Status: DC | PRN

## 2015-12-07 MED ORDER — AMLODIPINE BESYLATE 10 MG PO TABS
10.0000 mg | ORAL_TABLET | Freq: Every day | ORAL | Status: DC
  Administered 2015-12-07 – 2015-12-11 (×5): 10 mg via ORAL
  Filled 2015-12-07 (×5): qty 1

## 2015-12-07 MED ORDER — INSULIN ASPART 100 UNIT/ML ~~LOC~~ SOLN: 0.0000 [IU] | Freq: Every day | SUBCUTANEOUS | Status: DC

## 2015-12-07 MED ORDER — TRAMADOL HCL 50 MG PO TABS
50.0000 mg | ORAL_TABLET | Freq: Four times a day (QID) | ORAL | Status: DC | PRN
  Administered 2015-12-07 – 2015-12-10 (×8): 50 mg via ORAL
  Filled 2015-12-07 (×8): qty 1

## 2015-12-07 MED ORDER — CALCIUM CARBONATE ANTACID 500 MG PO CHEW
2.0000 | CHEWABLE_TABLET | Freq: Four times a day (QID) | ORAL | Status: DC | PRN
  Administered 2015-12-07: 400 mg via ORAL
  Filled 2015-12-07: qty 2

## 2015-12-07 MED ORDER — PREDNISONE 10 MG PO TABS
10.0000 mg | ORAL_TABLET | Freq: Every day | ORAL | Status: DC
  Administered 2015-12-08 – 2015-12-10 (×3): 10 mg via ORAL
  Filled 2015-12-07 (×3): qty 1

## 2015-12-07 MED ORDER — PANTOPRAZOLE SODIUM 40 MG PO TBEC
40.0000 mg | DELAYED_RELEASE_TABLET | Freq: Two times a day (BID) | ORAL | Status: DC
  Administered 2015-12-07 – 2015-12-11 (×8): 40 mg via ORAL
  Filled 2015-12-07 (×9): qty 1

## 2015-12-07 MED ORDER — CARVEDILOL 12.5 MG PO TABS
12.5000 mg | ORAL_TABLET | Freq: Two times a day (BID) | ORAL | Status: DC
  Administered 2015-12-07 – 2015-12-11 (×9): 12.5 mg via ORAL
  Filled 2015-12-07 (×2): qty 1
  Filled 2015-12-07: qty 2
  Filled 2015-12-07 (×6): qty 1

## 2015-12-07 MED ORDER — ENOXAPARIN SODIUM 40 MG/0.4ML ~~LOC~~ SOLN
40.0000 mg | SUBCUTANEOUS | Status: DC
  Administered 2015-12-07 – 2015-12-09 (×3): 40 mg via SUBCUTANEOUS
  Filled 2015-12-07 (×3): qty 0.4

## 2015-12-07 MED ORDER — LORAZEPAM 2 MG/ML IJ SOLN
1.0000 mg | Freq: Four times a day (QID) | INTRAMUSCULAR | Status: AC | PRN
  Administered 2015-12-08 (×2): 1 mg via INTRAVENOUS
  Filled 2015-12-07 (×2): qty 1

## 2015-12-07 NOTE — Consult Note (Signed)
GI Inpatient Consult Note  Reason for Consult:pancreatitis, abdominal pain   Attending Requesting Consult:Dr. Luci Bank  History of Present Illness: Robert Ball is a 70 y.o. male admitted for acute anaphylactic reaction to IV contrast dye with intubation.  Now extubated and doing better.  Pt CT showed calcifications in pancreas and fatty liver.  Elevated lipase and amylase now and diffuse abd pain.  He has diarrhea chronicly for last 6 months.  A three day stool collection has recently been collected for fat which may show steatorrhea due to chronic pancreatitis.   Past Medical History:  Past Medical History:  Diagnosis Date  . Anxiety   . Cardiac defibrillator in place   . COPD (chronic obstructive pulmonary disease) (HCC)    On 2liter oxygen  . Depression   . Elevated lipids   . GERD (gastroesophageal reflux disease)   . Headache   . Hypertension   . Prostate enlargement   . PTSD (post-traumatic stress disorder)   . Shakes     Problem List: Patient Active Problem List   Diagnosis Date Noted  . Anaphylactic shock 12/05/2015  . Nocturia 10/15/2015  . Arteriosclerosis of coronary artery 07/25/2015  . Benign prostatic hyperplasia with urinary obstruction 07/03/2015  . Chronic LBP 04/16/2015  . Abdominal pain 04/09/2015  . Clinical depression 01/31/2015  . Other long term (current) drug therapy 01/31/2015  . 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 Chest pain 01/02/2015  . Venous insufficiency of leg 01/02/2015  . Pulmonary hypertension (HCC) 03/20/2014  . Chronic systolic heart failure (HCC) 03/20/2014  . Allergic rhinitis 01/25/2014  . Back pain, chronic 01/25/2014  . Chronic diarrhea 01/25/2014  . Chronic kidney disease (CKD), stage III (moderate) 01/25/2014  . Benign essential HTN 01/25/2014  . Combined fat and carbohydrate induced hyperlipemia 01/25/2014  . Has a tremor 01/25/2014  . Oxygen desaturation 12/28/2013  . Subclinical hypothyroidism 12/28/2012  .  Encounter for issue of repeat prescription 06/23/2012  . Acid reflux 12/17/2011  . Neurosis, posttraumatic 12/17/2011  . Persons encountering health services in other specified circumstances 12/17/2011  . Encounter for other specified special examinations 12/17/2011  . Colon polyp 12/10/2011  . Chronic obstructive pulmonary disease (HCC) 12/10/2011  . Gout 12/10/2011  . History of biliary T-tube placement 12/10/2011    Past Surgical History: Past Surgical History:  Procedure Laterality Date  . BACK SURGERY    . CATARACT EXTRACTION W/ INTRAOCULAR LENS  IMPLANT, BILATERAL Bilateral   . EYE MUSCLE SURGERY Bilateral   . HEMORRHOID SURGERY    . HERNIA REPAIR    . ICD LEAD REMOVAL Left 12/27/2014   Procedure: ICD LEAD REMOVAL/ICD GENERATOR CHANGE OUT;  Surgeon: Marcina Millard, MD;  Location: [facility] ORS;  Service: Cardiovascular;  Laterality: Left;  . NECK SURGERY      Allergies: Allergies  Allergen Reactions  . Contrast Media [Iodinated Diagnostic Agents] Anaphylaxis    Angioedema  . Aspirin Other (See Comments) and Itching    Pt is unable to take this medication.    . Ciprofloxacin Hcl Other (See Comments)    Reaction:  Unknown   . Ibuprofen Other (See Comments) and Itching    Pt is unable to take this medication.    . Metronidazole Other (See Comments)    Reaction:  Unknown   . Naproxen Other (See Comments)    Pt is unable to take this medication.    . Oxycodone Itching  . Tetracyclines & Related Other (See Comments)    Reaction:  Unknown   .  Ciprofloxacin Rash  . Nitrofurantoin Rash  . Sulfa Antibiotics Rash  . Tetracycline Rash    Home Medications: Facility-Administered Medications Prior to Admission  Medication Dose Route Frequency Provider Last Rate Last Dose  . dexamethasone (DECADRON) injection 10 mg  10 mg Other Once Yevette Edwards, MD      . dexamethasone (DECADRON) injection 10 mg  10 mg Other Once Yevette Edwards, MD      . dexamethasone (DECADRON) injection 10  mg  10 mg Other Once Yevette Edwards, MD      . dexamethasone (DECADRON) injection 10 mg  10 mg Other Once Yevette Edwards, MD      . ropivacaine (PF) 2 mg/ml (0.2%) (NAROPIN) epidural 10 mL  10 mL Epidural Once Yevette Edwards, MD      . ropivacaine (PF) 2 mg/ml (0.2%) (NAROPIN) epidural 10 mL  10 mL Epidural Once Yevette Edwards, MD      . ropivacaine (PF) 2 mg/ml (0.2%) (NAROPIN) epidural 10 mL  10 mL Epidural Once Yevette Edwards, MD      . ropivacaine (PF) 2 mg/ml (0.2%) (NAROPIN) epidural 10 mL  10 mL Epidural Once Yevette Edwards, MD       Prescriptions Prior to Admission  Medication Sig Dispense Refill Last Dose  . acetaminophen (TYLENOL) 500 MG tablet Take 500 mg by mouth every 6 (six) hours as needed for mild pain or headache.   Taking  . albuterol (PROVENTIL HFA;VENTOLIN HFA) 108 (90 BASE) MCG/ACT inhaler Inhale 2 puffs into the lungs every 6 (six) hours as needed for wheezing or shortness of breath.   Taking  . albuterol (PROVENTIL) (2.5 MG/3ML) 0.083% nebulizer solution Take 2.5 mg by nebulization every 6 (six) hours as needed for wheezing or shortness of breath.   Taking  . amLODipine (NORVASC) 10 MG tablet Take 10 mg by mouth daily.   Taking  . budesonide-formoterol (SYMBICORT) 160-4.5 MCG/ACT inhaler Inhale into the lungs.   Taking  . carvedilol (COREG) 12.5 MG tablet Take 12.5 mg by mouth 2 (two) times daily.    Taking  . cetirizine (ZYRTEC) 10 MG tablet Take by mouth.   Taking  . Cholecalciferol (VITAMIN D3) 5000 UNITS CAPS Take 5,000 Units by mouth every other day. Reported on 10/31/2015   Not Taking  . fenofibrate 160 MG tablet Take 160 mg by mouth daily.   Taking  . furosemide (LASIX) 40 MG tablet Take 40 mg by mouth daily.   Taking  . loperamide (IMODIUM) 2 MG capsule Take 4 mg by mouth daily.    Taking  . omeprazole (PRILOSEC) 20 MG capsule Take 20 mg by mouth 2 (two) times daily.    Taking  . predniSONE (DELTASONE) 10 MG tablet Take 10 mg by mouth daily.  0 Taking  . prochlorperazine  (COMPAZINE) 5 MG tablet Take 5 mg by mouth 3 (three) times daily as needed for nausea or vomiting.    Taking  . roflumilast (DALIRESP) 500 MCG TABS tablet Take 500 mcg by mouth daily.   Taking  . sertraline (ZOLOFT) 100 MG tablet Take 100 mg by mouth 2 (two) times daily. Reported on 07/17/2015   Taking  . tamsulosin (FLOMAX) 0.4 MG CAPS capsule Take 1 capsule (0.4 mg total) by mouth daily. (Patient taking differently: Take 0.4 mg by mouth 2 (two) times daily. ) 14 capsule 3 Taking  . tiotropium (SPIRIVA) 18 MCG inhalation capsule Place 18 mcg into inhaler and inhale daily.  Taking  . traMADol (ULTRAM) 50 MG tablet Take 1 tablet (50 mg total) by mouth every 6 (six) hours as needed. 120 tablet 2 Taking  . ziprasidone (GEODON) 60 MG capsule Take 60 mg by mouth at bedtime.   Taking  . [DISCONTINUED] amoxicillin-clavulanate (AUGMENTIN) 875-125 MG tablet Take 1 tablet by mouth 2 (two) times daily. (Patient not taking: Reported on 07/17/2015) 14 tablet 0 Not Taking   Home medication reconciliation was completed with the patient.   Scheduled Inpatient Medications:   . amLODipine  10 mg Oral Daily  . budesonide (PULMICORT) nebulizer solution  0.25 mg Nebulization Q6H  . carvedilol  12.5 mg Oral BID WC  . cholecalciferol  5,000 Units Oral Q48H  . enoxaparin (LOVENOX) injection  40 mg Subcutaneous Q24H  . fenofibrate  160 mg Oral Daily  . furosemide  40 mg Oral Daily  . insulin aspart  0-15 Units Subcutaneous TID WC  . insulin aspart  0-5 Units Subcutaneous QHS  . mometasone-formoterol  2 puff Inhalation BID  . pantoprazole  40 mg Oral BID AC  . [START ON 12/08/2015] predniSONE  10 mg Oral Q breakfast  . roflumilast  500 mcg Oral Daily  . sertraline  100 mg Oral BID  . tamsulosin  0.4 mg Oral Daily  . tiotropium  18 mcg Inhalation Daily    Continuous Inpatient Infusions:     PRN Inpatient Medications:  sodium chloride, albuterol, diphenhydrAMINE, ipratropium-albuterol, phenol  Family  History: family history includes Heart disease in his father and mother; Lymphoma in his father.  The patient's family history is negative for inflammatory bowel disorders, GI malignancy, or solid organ transplantation.  Social History:   reports that he quit smoking about 8 years ago. His smoking use included Cigarettes. He has a 200.00 pack-year smoking history. He does not have any smokeless tobacco history on file. He reports that he drinks alcohol. He reports that he does not use drugs. The patient denies ETOH, tobacco, or drug use. Patient has hx of drinking a lot of beer and other alcohol as well for many years.  Pt interviewed with his wife.  Review of Systems: Constitutional: Weight is stable. Recovering from IV dye reaction. Eyes: No changes in vision. ENT: No oral lesions, sore throat.  GI: see HPI.  Heme/Lymph: No easy bruising.  CV: No chest pain.  GU: No hematuria.  Integumentary: No rashes.  Neuro: No headaches.  Psych: No depression/anxiety.  Endocrine: No heat/cold intolerance.  Allergic/Immunologic: No urticaria.  Resp: No cough, SOB.  Musculoskeletal: No joint swelling.    Physical Examination: BP (!) 149/60   Pulse 95   Temp 97.7 F (36.5 C) (Oral)   Resp (!) 29   Ht 5\' 10"  (1.778 m)   Wt 82 kg (180 lb 12.4 oz)   SpO2 94%   BMI 25.94 kg/m  Gen: NAD, alert and oriented x 4 HEENT: PEERLA, EOMI, Neck: supple, no JVD or thyromegaly Chest: CTA bilaterally, no wheezes, crackles, or other adventitious sounds, decreased air flow global CV: RRR, no m/g/c/r Abd: distended, bowel sounds present but decreased, diffuse tenderness to finger percussion Ext: no edema, well perfused with 2+ pulses, Skin: no rash or lesions noted Lymph: no LAD  Data: Lab Results  Component Value Date   WBC 7.3 12/07/2015   HGB 12.8 (L) 12/07/2015   HCT 37.0 (L) 12/07/2015   MCV 91.4 12/07/2015   PLT 113 (L) 12/07/2015    Recent Labs Lab 12/05/15 2011 12/06/15 0137  12/07/15  0504  HGB 15.1 15.4 12.8*   Lab Results  Component Value Date   NA 135 12/06/2015   K 4.2 12/06/2015   CL 106 12/06/2015   CO2 20 (L) 12/06/2015   BUN 18 12/06/2015   CREATININE 1.47 (H) 12/06/2015   Lab Results  Component Value Date   ALT 12 (L) 12/07/2015   AST 45 (H) 12/07/2015   ALKPHOS 21 (L) 12/07/2015   BILITOT 1.0 12/07/2015   No results for input(s): APTT, INR, PTT in the last 168 hours. Assessment/Plan: Mr. Echavarria is a 70 y.o. male likely chronic pancreatitis due to chronic alcohol ingestion.  This likely is cause of his diarrhea (reports 6-7 bowel movements a day)  He also has fatty liver.  Had severe anaphylactic reaction to iv contrast dye with need for intubation.  He has signif pain across abdomen so I would have him on water and ice chips for now.  Pain medicine as appropriate for his pancreatitis.  Follow Lipase and amylase.  Recommendations:supportive care with pain meds, antiemetics prn, iv fluids, check labs daily or every other day, await stool studies, when he is eating again would get fecal elastase stool test as this is a short test that will show if pancreatic exocrine insuff exists.  Thank you for the consult. Please call with questions or concerns.  Lynnae Prude, MD

## 2015-12-07 NOTE — Progress Notes (Signed)
Dr. Emmit Pomfret notified about the patient's last drink of ETOH. CIWA protocol ordered and explained to patient and family that is present

## 2015-12-07 NOTE — Progress Notes (Signed)
Wife explained that patient took his last alcoholic drink on Wed. Or Thursday and he is usually a heavy drinker. Urinary urgency and frequency noted. 10 20 ml of urine at a time. Bladder scan reveals 250-400 ml in the bladder.

## 2015-12-07 NOTE — Progress Notes (Signed)
[facility] [place] at [facility]   PATIENT NAME: Robert Ball    MR#:  [id number]  DATE OF BIRTH:  11-May-1945  SUBJECTIVE:  CHIEF COMPLAINT:   Chief Complaint  Patient presents with  . Allergic Reaction   Patient is 70 year old Caucasian male with medical history significant for history of COPD, coronary artery disease, cardiac defibrillator, hyperlipidemia, gastroesophageal reflux disease, depression, chronic diarrhea, abdominal pain, who presents to the hospital after he was noted to have swelling and rash as well as shortness of breath undergoing CT scan of the abdomen with contrast. He was brought to emergency room for further evaluation, received 2 doses of epinephrine and albuterol, but his airway was compromised and therefore he was intubated and placed on mechanical and ventilation and admitted to intensive care unit. The patient was extubated on 12/06/2015. He feels somewhat better, still feels weak, garbled speech, somnolent. Admits of some abdominal pain. CT of abdomen revealed thickened urinary bladder, hepatic steatosis, some pancreatic calcifications. Patient's labs revealed elevated lipase as well as amylase and AST, gastroenterology consultation was requested. Dr. Mechele Collin saw patient in consultation and recommended supportive care, await for stool cultures, get fecal elastase stool test to rule out pancreatic insufficiency.  Review of Systems  Unable to perform ROS: Medical condition    VITAL SIGNS: Blood pressure (!) 154/68, pulse 84, temperature 98.9 F (37.2 C), temperature source Oral, resp. rate 20, height 5\' 10"  (1.778 m), weight 82 kg (180 lb 12.4 oz), SpO2 97 %.  PHYSICAL EXAMINATION:   GENERAL:  70 y.o.-year-old patient lying in the bed with no acute distress. Some somnolent and has slurry speech EYES: Pupils equal, round, reactive to light and accommodation. No scleral icterus. Extraocular muscles intact.  HEENT: Head atraumatic,  normocephalic. Oropharynx and nasopharynx clear.  NECK:  Supple, no jugular venous distention. No thyroid enlargement, no tenderness.  LUNGS: Normal breath sounds bilaterally, no wheezing, rales,rhonchi or crepitation. No use of accessory muscles of respiration.  CARDIOVASCULAR: S1, S2 normal. No murmurs, rubs, or gallops.  ABDOMEN: Moderately form, tender diffusely, but no rebound or guarding noted, some distended. Bowel sounds present. No organomegaly or mass.  EXTREMITIES: No pedal edema, cyanosis, or clubbing.  NEUROLOGIC: Cranial nerves II through XII are intact. Muscle strength 5/5 in all extremities. Sensation intact. Gait not checked.  PSYCHIATRIC: The patient is alert and oriented x 3.  SKIN: No obvious rash, lesion, or ulcer.   ORDERS/RESULTS REVIEWED:   CBC  Recent Labs Lab 12/05/15 2011 12/06/15 0137 12/07/15 0504  WBC 27.0* 24.4* 7.3  HGB 15.1 15.4 12.8*  HCT 44.2 45.1 37.0*  PLT 294 247 113*  MCV 92.3 92.6 91.4  MCH 31.5 31.7 31.5  MCHC 34.1 34.2 34.4  RDW 13.9 14.3 13.7   ------------------------------------------------------------------------------------------------------------------  Chemistries   Recent Labs Lab 12/05/15 2011 12/06/15 0137 12/06/15 2119 12/07/15 0504  NA 132* 134* 135  --   K 4.0 4.7 4.2  --   CL 100* 105 106  --   CO2 22 21* 20*  --   GLUCOSE 218* 253* 110*  --   BUN 13 15 18   --   CREATININE 1.88*  1.93* 2.00* 1.47*  --   CALCIUM 8.9 8.5* 8.5*  --   MG  --  1.6* 1.6*  --   AST  --   --   --  45*  ALT  --   --   --  12*  ALKPHOS  --   --   --  21*  BILITOT  --   --   --  1.0   ------------------------------------------------------------------------------------------------------------------ estimated creatinine clearance is 46.9 mL/min (by C-G formula based on SCr of 1.47 mg/dL). ------------------------------------------------------------------------------------------------------------------ No results for input(s): TSH,  T4TOTAL, T3FREE, THYROIDAB in the last 72 hours.  Invalid input(s): FREET3  Cardiac Enzymes No results for input(s): CKMB, TROPONINI, MYOGLOBIN in the last 168 hours.  Invalid input(s): CK ------------------------------------------------------------------------------------------------------------------ Invalid input(s): POCBNP ---------------------------------------------------------------------------------------------------------------  RADIOLOGY: Dg Chest Port 1 View  Result Date: 12/06/2015 CLINICAL DATA:  Status post extubation.  Labored breathing EXAM: PORTABLE CHEST 1 VIEW COMPARISON:  12/06/2015 FINDINGS: There is a left chest wall ICD with leads in the right ventricle and coronary sinus. Mild cardiac enlargement. Lung volumes are low. No pleural effusion or edema. No airspace consolidation identified. IMPRESSION: Persistent decreased lung volumes. Electronically Signed   By: Signa Kell M.D.   On: 12/06/2015 19:27   Dg Chest Port 1 View  Result Date: 12/06/2015 CLINICAL DATA:  Acute respiratory failure EXAM: PORTABLE CHEST 1 VIEW COMPARISON:  Yesterday FINDINGS: Endotracheal tube tip terminates between the clavicular heads and carina. An nasogastric tube tip is at the distal stomach. Biventricular ICD/pacer leads from the left, stable. Low volumes with basilar atelectasis. No edema, effusion, or pneumothorax. Stable cardiomegaly. IMPRESSION: 1. Stable positioning of endotracheal and nasogastric tubes. 2. Low volumes with bibasilar atelectasis. Electronically Signed   By: Marnee Spring M.D.   On: 12/06/2015 07:30   Dg Chest Port 1 View  Result Date: 12/05/2015 CLINICAL DATA:  Intubation. EXAM: PORTABLE CHEST 1 VIEW COMPARISON:  Chest radiograph December 05, 2015 at 1902 hours FINDINGS: Endotracheal tube tip projects 3.6 cm above the carina. Nasogastric tube past distal stomach, distal tip not imaged. Cardiac silhouette is mildly enlarged, mediastinal silhouette is nonsuspicious. Mild  pulmonary vascular congestion. Bibasilar strandy densities. Small RIGHT pleural effusion. No pneumothorax. LEFT cardiac defibrillator in situ. Soft tissue planes and included osseous structures are unchanged. Multiple tubes and lines overlie the patient. IMPRESSION: Retracted endotracheal tube, distal tip projects 3.6 cm above the carina. Nasogastric tube past distal stomach. Mild cardiomegaly and pulmonary vascular congestion. Bibasilar atelectasis. Small RIGHT pleural effusion. Electronically Signed   By: Awilda Metro M.D.   On: 12/05/2015 22:43   Dg Chest Portable 1 View  Result Date: 12/05/2015 CLINICAL DATA:  Status post intubation. EXAM: PORTABLE CHEST 1 VIEW COMPARISON:  07/11/2015 FINDINGS: Endotracheal tube tip lies at the [facility]. Consider retracting 1-2 cm. Cardiac silhouette is partly obscured by the diaphragms. It is borderline enlarged. No mediastinal or hilar masses. Left anterior chest wall biventricular cardioverter-defibrillator is stable in well positioned. Mild basilar opacities noted most likely atelectasis. No convincing pulmonary edema or pleural effusion. No pneumothorax. IMPRESSION: 1. Endotracheal tube tip projects at the [place]. Recommend retracting 1-2 cm. 2. Bibasilar opacity is noted most likely atelectasis. No convincing pneumonia no pulmonary edema. Electronically Signed   By: Amie Portland M.D.   On: 12/05/2015 19:18   Dg Abd Portable 1v  Result Date: 12/05/2015 CLINICAL DATA:  Encounter for nasogastric tube placement. EXAM: PORTABLE ABDOMEN - 1 VIEW COMPARISON:  CT 12/05/2015 FINDINGS: Tip and side port of the enteric tube below the diaphragm in the stomach. No dilated bowel loops in the upper abdomen. There is enteric contrast within colon in the left abdomen from prior CT. IMPRESSION: Tip and side port of the enteric tube below the diaphragm in the stomach. Electronically Signed   By: Rubye Oaks M.D.   On: 12/05/2015 22:48  EKG:  Orders placed or performed  during the hospital encounter of 06/27/15  . EKG 12-Lead  . EKG 12-Lead  . ED EKG  . ED EKG    ASSESSMENT AND PLAN:  Active Problems:   Anaphylactic shock #1. Anaphylactic shock, requiring intubation and mechanical ventilation, extubated fourth of August 2017, doing relatively well, continue Benadryl, prednisone, supportive therapy #2. Diffuse abdominal pain, likely due to chronic pancreatitis, supportive therapy, getting stool elastase  testing to rule out pancreatic insufficiency, initiate Creon if needed #3. Acute renal failure, improving with medications, follow in the morning, get bladder scan, renal ultrasound #4. Elevated transaminase level, likely fatty liver disease due to obesity, questionable alcohol abuse, supportive therapy, follow LFTs in the morning #5. Diarrhea, questionable exocrine pancreatic insufficiency, stool elastase level will be checked to rule out pancreatic insufficiency, initiate Creon as needed  Management plans discussed with the patient, family and they are in agreement.   DRUG ALLERGIES:  Allergies  Allergen Reactions  . Contrast Media [Iodinated Diagnostic Agents] Anaphylaxis    Angioedema  . Aspirin Other (See Comments) and Itching    Pt is unable to take this medication.    . Ciprofloxacin Hcl Other (See Comments)    Reaction:  Unknown   . Ibuprofen Other (See Comments) and Itching    Pt is unable to take this medication.    . Metronidazole Other (See Comments)    Reaction:  Unknown   . Naproxen Other (See Comments)    Pt is unable to take this medication.    . Oxycodone Itching  . Tetracyclines & Related Other (See Comments)    Reaction:  Unknown   . Ciprofloxacin Rash  . Nitrofurantoin Rash  . Sulfa Antibiotics Rash  . Tetracycline Rash    CODE STATUS:     Code Status Orders        Start     Ordered   12/05/15 1928  Full code  Continuous     12/05/15 1929    Code Status History    Date Active Date Inactive Code Status Order  ID Comments User Context   04/09/2015 10:30 PM 04/11/2015  6:23 PM Full Code [id number]  Houston Siren, MD Inpatient   12/27/2014  3:17 PM 12/27/2014  7:47 PM Full Code [id number]  Marcina Millard, MD Inpatient      TOTAL TIME TAKING CARE OF THIS PATIENT: 35 minutes.    Katharina Caper M.D on 12/07/2015 at 1:56 PM  Between 7am to 6pm - Pager - [phone]  After 6pm go to [email or web address] - password [email or web address] [facility]  [facility]  Office  [phone]  CC: Primary care physician; Mickey Farber, MD

## 2015-12-07 NOTE — Progress Notes (Signed)
PULMONARY / CRITICAL CARE MEDICINE   Name: Robert Ball MRN: [id number] DOB: 27-Aug-1945    ADMISSION DATE:  12/05/2015 CONSULTATION DATE:  12/05/15  REFERRING MD: Dr. Silvano Rusk  CHIEF COMPLAINT:  Anaphylactic shock  HISTORY OF PRESENT ILLNESS:   Robert Ball is a 70 year old male with PMH significant for COPD, CAD, cardiac defibrillator in place, hyperlipidemia, GERD, depression. Apparently patient had a history of chronic diarrhea for the last past 6 months and abdominal pain. Patient went to his PCP. CT of the abdomen and pelvis was ordered with contrast. Patient went home and had supper with his wife around 16:30, wife noticed his face starts swelling. EMS was called and  Patient was brought  to [facility] ED.  Patient received 2 dose of epinephrine and albuterol treatment but his airway was compromised and therefore was intubated and on mechanical ventilation.  PAST MEDICAL HISTORY :  He  has a past medical history of Anxiety; Cardiac defibrillator in place; COPD (chronic obstructive pulmonary disease) (HCC); Depression; Elevated lipids; GERD (gastroesophageal reflux disease); Headache; Hypertension; Prostate enlargement; PTSD (post-traumatic stress disorder); and Shakes.   SUBJECTIVE:  Extubated 8/4. Occasional wheezing, and anxiety; declined humidified O2. C/o di   VITAL SIGNS: BP (!) 157/83   Pulse 94   Temp 97.8 F (36.6 C) (Oral)   Resp (!) 25   Ht 5\' 10"  (1.778 m)   Wt 190 lb 14.7 oz (86.6 kg)   SpO2 97%   BMI 27.39 kg/m   HEMODYNAMICS:    VENTILATOR SETTINGS: Vent Mode: PRVC FiO2 (%):  [40 %] 40 % Set Rate:  [20 bmp] 20 bmp Vt Set:  [500 mL] 500 mL PEEP:  [5 cmH20] 5 cmH20  INTAKE / OUTPUT: I/O last 3 completed shifts: In: 2215.8 [I.V.:1365.8; IV Piggyback:850] Out: 1075 [Urine:1075]  PHYSICAL EXAMINATION: General:  Elderly, well built white male  Neuro: lethargic but awakens to voice, touch and follows some commands HEENT:  Atraumatic, normocephalic, PERRLA,  No JVD appreciated Lungs: Normal WOB, diffused wheezes, no crackles noted, breath sounds diminished in the bases Cardiovascular: S1S2, RRR, no mrg noted Abdomen:  Obese, round, soft, nontender Musculoskeletal:  No inflammation/deformity noted Skin:  Grossly intact  LABS:  BMET  Recent Labs Lab 12/05/15 2011 12/06/15 0137 12/06/15 2119  NA 132* 134* 135  K 4.0 4.7 4.2  CL 100* 105 106  CO2 22 21* 20*  BUN 13 15 18   CREATININE 1.88*  1.93* 2.00* 1.47*  GLUCOSE 218* 253* 110*    Electrolytes  Recent Labs Lab 12/05/15 2011 12/06/15 0137 12/06/15 2119  CALCIUM 8.9 8.5* 8.5*  MG  --  1.6* 1.6*  PHOS  --  3.6 2.0*    CBC  Recent Labs Lab 12/05/15 2011 12/06/15 0137  WBC 27.0* 24.4*  HGB 15.1 15.4  HCT 44.2 45.1  PLT 294 247    Coag's No results for input(s): APTT, INR in the last 168 hours.  Sepsis Markers  Recent Labs Lab 12/05/15 2011 12/06/15 0137 12/06/15 2119 12/07/15 0504  LATICACIDVEN 1.8 3.8*  --   --   PROCALCITON  --   --  0.91 0.91    ABG  Recent Labs Lab 12/05/15 2030 12/06/15 0411 12/06/15 2138  PHART 7.25* 7.24* 7.44  PCO2ART 42 39 34  PO2ART 263* 127* 78*    Liver Enzymes No results for input(s): AST, ALT, ALKPHOS, BILITOT, ALBUMIN in the last 168 hours.  Cardiac Enzymes No results for input(s): TROPONINI, PROBNP in the last 168 hours.  Glucose  Recent Labs Lab 12/05/15 2045 12/06/15 0235 12/06/15 0729 12/06/15 0854  GLUCAP 211* 247* 263* 229*    Imaging Dg Chest Port 1 View  Result Date: 12/06/2015 CLINICAL DATA:  Status post extubation.  Labored breathing EXAM: PORTABLE CHEST 1 VIEW COMPARISON:  12/06/2015 FINDINGS: There is a left chest wall ICD with leads in the right ventricle and coronary sinus. Mild cardiac enlargement. Lung volumes are low. No pleural effusion or edema. No airspace consolidation identified. IMPRESSION: Persistent decreased lung volumes. Electronically Signed   By: Signa Kell M.D.   On:  12/06/2015 19:27    STUDIES:  8/3 CT abdomen and pelvis>>No acute findings in the abdomen or pelvis  8/10 EF-40% hypo contractile LV function 8/1 - C-diff negative  CULTURES: none  ANTIBIOTICS: none  SIGNIFICANT EVENTS: 8/3>> Patient admitted to [facility] in Anaphylactic shock, necessitating mechanical ventilation. Now extubated and doing well  LINES/TUBES: none  DISCUSSION: 70 year old male with Anaphylactic shock with contrast dye.  ASSESSMENT / PLAN:  PULMONARY A: Anaphylactic shock secondary to  Acute respiratory failure due to anaphylactic reaction  severe-COPD- stage 2 -3liter(sees Dr. Meredeth Ide at [place] [facility] P:   Supplemental O2 as needed Albuterol prn  Prn CXR  CARDIOVASCULAR A:  Mildly elevated troponin Cardiac defibrillator in SITU Chronic systolic heart failure- Hx of hyperlipidemia Hx of hypertension P:  Continue Epinephrine gtt MAP goals>65 Resume Amlodipine/coreg, fenofibrate  and  lasix  RENAL A:   Acute on chronic Kidney disease  Hx of Hyponatremia Hx of BPH P:   Follow chemistry Replace electrolytes per ICU protocol Resume Flomax  GASTROINTESTINAL A:   Abdominal pain Hx of chronic diarrhea Hx of abdominal pain Acute diverticulitis Hx of GERD P:   Oral intake as tolerated GI consult Continue famotidine  HEMATOLOGIC A:   No active issues P:  Heparin for DVT prophylaxis Transfuse if Hgb <7  INFECTIOUS A:   Leukocytosis P:   Follow lactic acid Monitor fever curve Trend cbc  ENDOCRINE A:   No active issues P:   Blood sugar checks intermittently   NEUROLOGIC A:   Hx Of PTSD Hx of depression/tremors  P:   RASS goal: 0 to -1 Resume Geodon/zoloft  Disposition and family update: Son and daughter-in-law at bedside; updated on patient's condition  Magdalene S. [facility] ANP-BC Pulmonary and Critical Care Medicine [facility] Pager [phone] or [phone]  12/07/2015, 6:42 AM    PCCM ATTENDING  ATTESTATION:  I have evaluated patient with ANP Luci Bank, reviewed database in its entirety and discussed care plan in detail.  Major problems addressed by PCCM team: Anaphylaxis due to IV contrast - resolved Severe COPD  Followed by Dr Meredeth Ide as outpt Abdominal pain Elevate amylase/lipase Recent CT abd for chronic abdominal pain  Pancreatic calcifications noted  Evaluated by Dr Daphine Deutscher as outpt prior to admission    PLAN/REC: Transfer to med/surg Hospitalists to assume primary duties of care. Discussed with Dr Winona Legato Resume oupt COPD regimen Continue PRN albuterol DC methylpred. Resume outpt dose of prednisone (10 mg daily) GI consultation requested As of AM 08/06, PCCM will sign off. Please call if we can be of further assistance  Nethaniel Fischer, MD PCCM service Mobile [phone] Pager [phone]

## 2015-12-07 NOTE — Progress Notes (Signed)
Pt ambulated to the chair and is sitting in the chair comfortably. Tolerated ambulation well. Will continue to encourage ambulation.   Karsten Ro

## 2015-12-08 LAB — BASIC METABOLIC PANEL
ANION GAP: 16 — AB (ref 5–15)
BUN: 24 mg/dL — ABNORMAL HIGH (ref 6–20)
CALCIUM: 8.5 mg/dL — AB (ref 8.9–10.3)
CO2: 17 mmol/L — AB (ref 22–32)
Chloride: 99 mmol/L — ABNORMAL LOW (ref 101–111)
Creatinine, Ser: 1.41 mg/dL — ABNORMAL HIGH (ref 0.61–1.24)
GFR calc non Af Amer: 48 mL/min — ABNORMAL LOW (ref >60)
GFR, EST AFRICAN AMERICAN: 56 mL/min — AB (ref >60)
Glucose, Bld: 65 mg/dL (ref 65–99)
Potassium: 3.7 mmol/L (ref 3.5–5.1)
SODIUM: 132 mmol/L — AB (ref 135–145)

## 2015-12-08 LAB — GLUCOSE, CAPILLARY
GLUCOSE-CAPILLARY: 66 mg/dL (ref 65–99)
GLUCOSE-CAPILLARY: 82 mg/dL (ref 65–99)
GLUCOSE-CAPILLARY: 132 mg/dL — AB (ref 65–99)
GLUCOSE-CAPILLARY: 92 mg/dL (ref 65–99)
Glucose-Capillary: 120 mg/dL — ABNORMAL HIGH (ref 65–99)

## 2015-12-08 LAB — CBC
HCT: 32.3 % — ABNORMAL LOW (ref 40.0–52.0)
HEMOGLOBIN: 11.3 g/dL — AB (ref 13.0–18.0)
MCH: 32.3 pg (ref 26.0–34.0)
MCHC: 35.1 g/dL (ref 32.0–36.0)
MCV: 92 fL (ref 80.0–100.0)
PLATELETS: 110 10*3/uL — AB (ref 150–440)
RBC: 3.51 MIL/uL — AB (ref 4.40–5.90)
RDW: 13.7 % (ref 11.5–14.5)
WBC: 7.8 10*3/uL (ref 3.8–10.6)

## 2015-12-08 LAB — CLOSTRIDIUM DIFFICILE BY PCR: Toxigenic C. Difficile by PCR: POSITIVE — AB

## 2015-12-08 LAB — LIPASE, BLOOD: Lipase: 62 U/L — ABNORMAL HIGH (ref 11–51)

## 2015-12-08 LAB — C DIFFICILE QUICK SCREEN W PCR REFLEX
C Diff antigen: POSITIVE — AB
C Diff toxin: NEGATIVE

## 2015-12-08 LAB — AMMONIA: Ammonia: 16 umol/L (ref 9–35)

## 2015-12-08 MED ORDER — SUCRALFATE 1 GM/10ML PO SUSP
1.0000 g | Freq: Three times a day (TID) | ORAL | Status: DC
  Administered 2015-12-08 – 2015-12-11 (×13): 1 g via ORAL
  Filled 2015-12-08 (×14): qty 10

## 2015-12-08 MED ORDER — VANCOMYCIN 50 MG/ML ORAL SOLUTION
125.0000 mg | Freq: Four times a day (QID) | ORAL | Status: DC
  Administered 2015-12-08 – 2015-12-11 (×14): 125 mg via ORAL
  Filled 2015-12-08 (×17): qty 2.5

## 2015-12-08 MED ORDER — LORAZEPAM 2 MG/ML IJ SOLN
1.0000 mg | Freq: Once | INTRAMUSCULAR | Status: AC
Start: 2015-12-08 — End: 2015-12-08
  Administered 2015-12-08: 1 mg via INTRAVENOUS
  Filled 2015-12-08: qty 1

## 2015-12-08 MED FILL — Medication: Qty: 1 | Status: AC

## 2015-12-08 NOTE — Progress Notes (Signed)
CRITICAL VALUE ALERT  Critical value received: C Diff  Date of notification: 12/08/15  Time of notification:  0210 Critical value read back:yes  Nurse who received alert:  Donnie Ahoobin Sharis Keeran RN  MD notified (1st page):  Hugelmeyer  Time of first page:  0212  MD notified (2nd page):  Time of second page:  Responding MD:    Time MD responded:

## 2015-12-08 NOTE — Progress Notes (Signed)
4 large loose, liguid golden colored foul smelling stools incontinent tonight. MD notified. Stool spec. Sent for c diff. Enteric precautions started. Explained to patient and family

## 2015-12-08 NOTE — Progress Notes (Signed)
[facility]   PATIENT NAME: Robert Ball    MR#:  [id number]  DATE OF BIRTH:  29-Mar-1946  SUBJECTIVE:  CHIEF COMPLAINT:   Chief Complaint  Patient presents with  . Allergic Reaction   Patient is 70 year old Caucasian male with medical history significant for history of COPD, coronary artery disease, cardiac defibrillator, hyperlipidemia, gastroesophageal reflux disease, depression, chronic diarrhea, abdominal pain, who presents to the hospital after he was noted to have swelling and rash as well as shortness of breath undergoing CT scan of the abdomen with contrast. He was brought to emergency room for further evaluation, received 2 doses of epinephrine and albuterol, but his airway was compromised and therefore he was intubated and placed on mechanical and ventilation and admitted to intensive care unit. The patient was extubated on 12/06/2015. He feels somewhat better, still feels weak, garbled speech, somnolent. Admits of some abdominal pain. CT of abdomen revealed thickened urinary bladder, hepatic steatosis, some pancreatic calcifications. Patient's labs revealed elevated lipase as well as amylase and AST, gastroenterology consultation was requested. Dr. Mechele Collin saw patient in consultation and recommended supportive care, await for stool cultures, get fecal elastase stool test to rule out pancreatic insufficiency.  Patient feels comfortable today, however, just awoke, still remains sleepy Review of Systems  Unable to perform ROS: Medical condition    VITAL SIGNS: Blood pressure 128/71, pulse 88, temperature 98.8 F (37.1 C), temperature source Oral, resp. rate (!) 24, height 5\' 10"  (1.778 m), weight 82 kg (180 lb 12.4 oz), SpO2 98 %.  PHYSICAL EXAMINATION:   GENERAL:  70 y.o.-year-old patient lying in the bed with no acute distress. Some somnolent and has slurry speech EYES: Pupils equal, round, reactive to light and accommodation. No  scleral icterus. Extraocular muscles intact.  HEENT: Head atraumatic, normocephalic. Oropharynx and nasopharynx clear.  NECK:  Supple, no jugular venous distention. No thyroid enlargement, no tenderness.  LUNGS: Normal breath sounds bilaterally, no wheezing, rales,rhonchi or crepitation. No use of accessory muscles of respiration.  CARDIOVASCULAR: S1, S2 normal. No murmurs, rubs, or gallops.  ABDOMEN: Moderately form, tender diffusely, but no rebound or guarding noted, some distended. Bowel sounds present. No organomegaly or mass.  EXTREMITIES: No pedal edema, cyanosis, or clubbing.  NEUROLOGIC: Cranial nerves II through XII are intact. Muscle strength 5/5 in all extremities. Sensation intact. Gait not checked.  PSYCHIATRIC: The patient is alert and oriented x 3.  SKIN: No obvious rash, lesion, or ulcer.   ORDERS/RESULTS REVIEWED:   CBC  Recent Labs Lab 12/05/15 2011 12/06/15 0137 12/07/15 0504 12/08/15 0726  WBC 27.0* 24.4* 7.3 7.8  HGB 15.1 15.4 12.8* 11.3*  HCT 44.2 45.1 37.0* 32.3*  PLT 294 247 113* 110*  MCV 92.3 92.6 91.4 92.0  MCH 31.5 31.7 31.5 32.3  MCHC 34.1 34.2 34.4 35.1  RDW 13.9 14.3 13.7 13.7   ------------------------------------------------------------------------------------------------------------------  Chemistries   Recent Labs Lab 12/05/15 2011 12/06/15 0137 12/06/15 2119 12/07/15 0504 12/07/15 1609  NA 132* 134* 135  --  133*  K 4.0 4.7 4.2  --  4.1  CL 100* 105 106  --  101  CO2 22 21* 20*  --  19*  GLUCOSE 218* 253* 110*  --  116*  BUN 13 15 18   --  19  CREATININE 1.88*  1.93* 2.00* 1.47*  --  1.45*  CALCIUM 8.9 8.5* 8.5*  --  8.4*  MG  --  1.6* 1.6*  --   --  AST  --   --   --  45* 57*  ALT  --   --   --  12* 15*  ALKPHOS  --   --   --  21* 21*  BILITOT  --   --   --  1.0 1.6*   ------------------------------------------------------------------------------------------------------------------ estimated creatinine clearance is 47.5  mL/min (by C-G formula based on SCr of 1.45 mg/dL). ------------------------------------------------------------------------------------------------------------------ No results for input(s): TSH, T4TOTAL, T3FREE, THYROIDAB in the last 72 hours.  Invalid input(s): FREET3  Cardiac Enzymes No results for input(s): CKMB, TROPONINI, MYOGLOBIN in the last 168 hours.  Invalid input(s): CK ------------------------------------------------------------------------------------------------------------------ Invalid input(s): POCBNP ---------------------------------------------------------------------------------------------------------------  RADIOLOGY: [place] Renal  Result Date: 12/07/2015 CLINICAL DATA:  Patient with acute renal failure. EXAM: RENAL / URINARY TRACT ULTRASOUND COMPLETE COMPARISON:  CT abdomen pelvis 12/05/2015. FINDINGS: Right Kidney: Length: 9.8 cm. Echogenicity within normal limits. No mass or hydronephrosis visualized. Left Kidney: Length: 10.8 cm. Echogenicity within normal limits. No mass or hydronephrosis visualized. Bladder: Decompressed with Foley catheter. Hepatic steatosis. Splenomegaly, measuring up to 18 cm. IMPRESSION: No hydronephrosis. Hepatic steatosis. Splenomegaly. Electronically Signed   By: Annia Beltrew  Davis M.D.   On: 12/07/2015 15:31   Dg Chest Port 1 View  Result Date: 12/06/2015 CLINICAL DATA:  Status post extubation.  Labored breathing EXAM: PORTABLE CHEST 1 VIEW COMPARISON:  12/06/2015 FINDINGS: There is a left chest wall ICD with leads in the right ventricle and coronary sinus. Mild cardiac enlargement. Lung volumes are low. No pleural effusion or edema. No airspace consolidation identified. IMPRESSION: Persistent decreased lung volumes. Electronically Signed   By: Signa Kellaylor  Stroud M.D.   On: 12/06/2015 19:27    EKG:  Orders placed or performed during the hospital encounter of 06/27/15  . EKG 12-Lead  . EKG 12-Lead  . ED EKG  . ED EKG    ASSESSMENT AND  PLAN:  Active Problems:   Anaphylactic shock #1. Anaphylactic shock, requiring intubation and mechanical ventilation, extubated fourth of August 2017, doing relatively well, continue Benadryl, prednisone, Nebulizers, supportive therapy, get physical therapist involved for recommendations, likely home health physical therapy upon discharge tomorrow #2. Diffuse abdominal pain, due to chronic pancreatitis, supportive therapy, get stool elastase  testing to rule out pancreatic insufficiency, initiate Creon if needed #3. Acute renal failure, improved with medications, follow in the morning, getting bladder scan, renal ultrasound, no hydronephrosis noted #4. Elevated transaminase level, likely fatty liver disease due to obesity, significant alcohol abuse, per patient's family member, supportive therapy, follow LFTs in the morning #5. 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 Generalized weakness, getting physical therapist involved for recommendations, possible home health services  Management plans discussed with the patient, family and they are in agreement.   DRUG ALLERGIES:  Allergies  Allergen Reactions  . Contrast Media [Iodinated Diagnostic Agents] Anaphylaxis    Angioedema  . Aspirin Other (See Comments) and Itching    Pt is unable to take this medication.    . Ciprofloxacin Hcl Other (See Comments)    Reaction:  Unknown   . Ibuprofen Other (See Comments) and Itching    Pt is unable to take this medication.    . Metronidazole Other (See Comments)    Reaction:  Unknown   . Naproxen Other (See Comments)    Pt is unable to take this medication.    . Oxycodone Itching  .  Tetracyclines & Related Other (See Comments)    Reaction:  Unknown   . Ciprofloxacin Rash  . Nitrofurantoin Rash  . Sulfa Antibiotics Rash  .  Tetracycline Rash    CODE STATUS:     Code Status Orders        Start     Ordered   12/05/15 1928  Full code  Continuous     12/05/15 1929    Code Status History    Date Active Date Inactive Code Status Order ID Comments User Context   04/09/2015 10:30 PM 04/11/2015  6:23 PM Full Code [id number]  Houston Siren, MD Inpatient   12/27/2014  3:17 PM 12/27/2014  7:47 PM Full Code [id number]  Marcina Millard, MD Inpatient      TOTAL TIME TAKING CARE OF THIS PATIENT: 35 minutes.    Katharina Caper M.D on 12/08/2015 at 11:41 AM  Between 7am to 6pm - Pager - [phone]  After 6pm go to [email or web address] - password [email or web address] [facility]  [place] [facility]  Office  [phone]  CC: Primary care physician; Mickey Farber, MD

## 2015-12-08 NOTE — Consult Note (Signed)
Patient with resp problems, had reaction to IV dye with hospitalization and intubation, now off vent.  He has C. Diff and is on vancomycin.  WBC 7.8  hgb 11.3  HCO3 17, lipase down to 62 NH3 16.  He has distended abdomen with some bowel sounds and percussion notes indicate ascites.  Pt family reports more alcohol use than patient will let on.  Will follow with you.

## 2015-12-08 NOTE — Care Management Important Message (Signed)
Important Message  Patient Details  Name: Robert Ball MRN: [id number] Date of Birth: 1945-08-22   Medicare Important Message Given:  Yes    [name] A, RN 12/08/2015, 2:36 PM

## 2015-12-08 NOTE — Progress Notes (Signed)
Resident in bed with  HOB elevated; meds ordered and CIWA as ordered; prn Ativan given as needed; Stool specimen collected this shift and sent to lab; appetite fair; fluids encouraged; Spouse at bedside and no distress observed; Will continue to monitor.

## 2015-12-09 LAB — GLUCOSE, CAPILLARY
GLUCOSE-CAPILLARY: 162 mg/dL — AB (ref 65–99)
GLUCOSE-CAPILLARY: 135 mg/dL — AB (ref 65–99)
Glucose-Capillary: 112 mg/dL — ABNORMAL HIGH (ref 65–99)
Glucose-Capillary: 112 mg/dL — ABNORMAL HIGH (ref 65–99)
Glucose-Capillary: 118 mg/dL — ABNORMAL HIGH (ref 65–99)
Glucose-Capillary: 121 mg/dL — ABNORMAL HIGH (ref 65–99)
Glucose-Capillary: 131 mg/dL — ABNORMAL HIGH (ref 65–99)
Glucose-Capillary: 144 mg/dL — ABNORMAL HIGH (ref 65–99)
Glucose-Capillary: 158 mg/dL — ABNORMAL HIGH (ref 65–99)
Glucose-Capillary: 111 mg/dL — ABNORMAL HIGH (ref 65–99)
Glucose-Capillary: 123 mg/dL — ABNORMAL HIGH (ref 65–99)

## 2015-12-09 LAB — CBC
HCT: 30.6 % — ABNORMAL LOW (ref 40.0–52.0)
Hemoglobin: 10.6 g/dL — ABNORMAL LOW (ref 13.0–18.0)
MCH: 31.8 pg (ref 26.0–34.0)
MCHC: 34.5 g/dL (ref 32.0–36.0)
MCV: 92 fL (ref 80.0–100.0)
Platelets: 119 10*3/uL — ABNORMAL LOW (ref 150–440)
RBC: 3.33 MIL/uL — AB (ref 4.40–5.90)
RDW: 13.8 % (ref 11.5–14.5)
WBC: 7.2 10*3/uL (ref 3.8–10.6)

## 2015-12-09 LAB — COMPREHENSIVE METABOLIC PANEL
ALK PHOS: 22 U/L — AB (ref 38–126)
ALT: 15 U/L — AB (ref 17–63)
ANION GAP: 15 (ref 5–15)
AST: 30 U/L (ref 15–41)
Albumin: 2.8 g/dL — ABNORMAL LOW (ref 3.5–5.0)
BILIRUBIN TOTAL: 2 mg/dL — AB (ref 0.3–1.2)
BUN: 23 mg/dL — ABNORMAL HIGH (ref 6–20)
CALCIUM: 8.2 mg/dL — AB (ref 8.9–10.3)
CO2: 21 mmol/L — AB (ref 22–32)
CREATININE: 1.22 mg/dL (ref 0.61–1.24)
Chloride: 98 mmol/L — ABNORMAL LOW (ref 101–111)
GFR calc non Af Amer: 57 mL/min — ABNORMAL LOW (ref >60)
Glucose, Bld: 71 mg/dL (ref 65–99)
Potassium: 3.5 mmol/L (ref 3.5–5.1)
SODIUM: 134 mmol/L — AB (ref 135–145)
TOTAL PROTEIN: 6.1 g/dL — AB (ref 6.5–8.1)

## 2015-12-09 NOTE — NC FL2 (Signed)
[place] MEDICAID FL2 LEVEL OF CARE SCREENING TOOL     IDENTIFICATION  Patient Name: Robert Ball Birthdate: December 26, 1945 Sex: male Admission Date (Current Location): 12/05/2015  [place] and [place] Number:  Chiropodist and Address:  [facility], [address], [place], [place] [address]      Provider Number: [id number]  Attending Physician Name and Address:  Katharina Caper, MD  Relative Name and Phone Number:       Current Level of Care: Hospital Recommended Level of Care: Skilled Nursing Facility Prior Approval Number:    Date Approved/Denied:   PASRR Number: [id number]  Discharge Plan: SNF    Current Diagnoses: Patient Active Problem List   Diagnosis Date Noted  . Anaphylactic shock 12/05/2015  . Nocturia 10/15/2015  . Arteriosclerosis of coronary artery 07/25/2015  . Benign prostatic hyperplasia with urinary obstruction 07/03/2015  . Chronic LBP 04/16/2015  . Abdominal pain 04/09/2015  . Clinical depression 01/31/2015  . Other long term (current) drug therapy 01/31/2015  . 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 Chest pain 01/02/2015  . Venous insufficiency of leg 01/02/2015  . Pulmonary hypertension (HCC) 03/20/2014  . Chronic systolic heart failure (HCC) 03/20/2014  . Allergic rhinitis 01/25/2014  . Back pain, chronic 01/25/2014  . Chronic diarrhea 01/25/2014  . Chronic kidney disease (CKD), stage III (moderate) 01/25/2014  . Benign essential HTN 01/25/2014  . Combined fat and carbohydrate induced hyperlipemia 01/25/2014  . Has a tremor 01/25/2014  . Oxygen desaturation 12/28/2013  . Subclinical hypothyroidism 12/28/2012  . Encounter for issue of repeat prescription 06/23/2012  . Acid reflux 12/17/2011  . Neurosis, posttraumatic 12/17/2011  . Persons encountering health services in other specified circumstances 12/17/2011  . Encounter for other specified special examinations 12/17/2011  . Colon polyp 12/10/2011   . Chronic obstructive pulmonary disease (HCC) 12/10/2011  . Gout 12/10/2011  . History of biliary T-tube placement 12/10/2011    Orientation RESPIRATION BLADDER Height & Weight     Self, Time, Situation, Place  Normal, O2 (2 liters) Incontinent Weight: 184 lb 4.8 oz (83.6 kg) Height:  5\' 10"  (177.8 cm)  BEHAVIORAL SYMPTOMS/MOOD NEUROLOGICAL BOWEL NUTRITION STATUS   (none)  (none) Incontinent Diet (carb modified)  AMBULATORY STATUS COMMUNICATION OF NEEDS Skin   Extensive Assist Verbally Normal (closed incision left shoulder)                       Personal Care Assistance Level of Assistance  Dressing, Bathing Bathing Assistance: Limited assistance   Dressing Assistance: Limited assistance     Functional Limitations Info             SPECIAL CARE FACTORS FREQUENCY  PT (By licensed PT)                    Contractures Contractures Info: Not present    Additional Factors Info  Code Status, Allergies Code Status Info: full Allergies Info: contrast media, asa, cipro, ibuprofen,naproxen, oxcycodone,nitrofuratione, tetracyclines, sulfa abx           Current Medications (12/09/2015):  This is the current hospital active medication list Current Facility-Administered Medications  Medication Dose Route Frequency Provider Last Rate Last Dose  . 0.9 %  sodium chloride infusion  250 mL Intravenous PRN Bincy S Varughese, NP      . albuterol (PROVENTIL) (2.5 MG/3ML) 0.083% nebulizer solution 2.5 mg  2.5 mg Nebulization Q3H PRN Merwyn Katos, MD   2.5 mg at 12/06/15 1745  .  amLODipine (NORVASC) tablet 10 mg  10 mg Oral Daily Lewie Loron, NP   10 mg at 12/09/15 1123  . budesonide (PULMICORT) nebulizer solution 0.25 mg  0.25 mg Nebulization Q6H Merwyn Katos, MD   0.25 mg at 12/09/15 0757  . calcium carbonate (TUMS - dosed in mg elemental calcium) chewable tablet 400 mg of elemental calcium  2 tablet Oral Q6H PRN Katha Hamming, MD   400 mg of elemental  calcium at 12/07/15 1848  . carvedilol (COREG) tablet 12.5 mg  12.5 mg Oral BID WC Lewie Loron, NP   12.5 mg at 12/09/15 0833  . cholecalciferol (VITAMIN D) tablet 5,000 Units  5,000 Units Oral Q48H Merwyn Katos, MD   5,000 Units at 12/08/15 1138  . diphenhydrAMINE (BENADRYL) injection 12.5 mg  12.5 mg Intravenous Q6H PRN Merwyn Katos, MD      . fenofibrate tablet 160 mg  160 mg Oral Daily Merwyn Katos, MD   160 mg at 12/09/15 1123  . furosemide (LASIX) tablet 40 mg  40 mg Oral Daily Lewie Loron, NP   40 mg at 12/09/15 1123  . insulin aspart (novoLOG) injection 0-15 Units  0-15 Units Subcutaneous TID WC Merwyn Katos, MD   3 Units at 12/09/15 1308  . insulin aspart (novoLOG) injection 0-5 Units  0-5 Units Subcutaneous QHS Merwyn Katos, MD      . ipratropium-albuterol (DUONEB) 0.5-2.5 (3) MG/3ML nebulizer solution 3 mL  3 mL Nebulization Q4H PRN Merwyn Katos, MD   3 mL at 12/08/15 0005  . LORazepam (ATIVAN) tablet 1 mg  1 mg Oral Q6H PRN Alexis Hugelmeyer, DO   1 mg at 12/08/15 2226   Or  . LORazepam (ATIVAN) injection 1 mg  1 mg Intravenous Q6H PRN Alexis Hugelmeyer, DO   1 mg at 12/08/15 1613  . mometasone-formoterol (DULERA) 200-5 MCG/ACT inhaler 2 puff  2 puff Inhalation BID Merwyn Katos, MD   2 puff at 12/09/15 [phone]  . pantoprazole (PROTONIX) EC tablet 40 mg  40 mg Oral BID AC Merwyn Katos, MD   40 mg at 12/09/15 9604  . phenol (CHLORASEPTIC) mouth spray 1 spray  1 spray Mouth/Throat PRN Lewie Loron, NP   1 spray at 12/07/15 1022  . predniSONE (DELTASONE) tablet 10 mg  10 mg Oral Q breakfast Merwyn Katos, MD   10 mg at 12/09/15 5409  . roflumilast (DALIRESP) tablet 500 mcg  500 mcg Oral Daily Merwyn Katos, MD   500 mcg at 12/09/15 1123  . sertraline (ZOLOFT) tablet 100 mg  100 mg Oral BID Merwyn Katos, MD   100 mg at 12/09/15 1123  . sucralfate (CARAFATE) 1 GM/10ML suspension 1 g  1 g Oral TID WC & HS Katharina Caper, MD   1 g at 12/09/15 1309   . tamsulosin (FLOMAX) capsule 0.4 mg  0.4 mg Oral Daily Merwyn Katos, MD   0.4 mg at 12/09/15 1123  . tiotropium (SPIRIVA) inhalation capsule 18 mcg  18 mcg Inhalation Daily Lupita Leash, MD   18 mcg at 12/09/15 0835  . traMADol (ULTRAM) tablet 50 mg  50 mg Oral Q6H PRN Merwyn Katos, MD   50 mg at 12/08/15 1822  . vancomycin (VANCOCIN) 50 mg/mL oral solution 125 mg  125 mg Oral QID Ihor Austin, MD   125 mg at 12/09/15 1526     Discharge Medications: Please see discharge summary for a  list of discharge medications.  Relevant Imaging Results:  Relevant Lab Results:   Additional Information SS: [id number]  York SpanielMonica Kano Heckmann, LCSW

## 2015-12-09 NOTE — Evaluation (Signed)
Physical Therapy Evaluation Patient Details Name: Robert Ball MRN: [id number] DOB: 04/04/1946 Today's Date: 12/09/2015   History of Present Illness  Pt is a 70 yr old who presented to his PCP 8/3 with chronic diarrhea and abdominal pain x 6months. Pt underwent CT with contrast of the abdomen and later that evening was noted to have facial swelling and rash. Pt presented to the ED and was admitted with anaphylactic shock in response to IV contrast and was intubated 8/3 secondary to compromised airway. Pt was then extubated 8/4. PMH significant for COPD, CAD, cardiac defibrillator placement, GERD and h/o increased EtOH intake (pt with CIWA protocol in place).   Clinical Impression  Prior to admission, pt was mod I versus I with household mobility and basic ADLs.  Pt lives with his spouse, in a one-story home with 7 STE (3 steps, platform, 4 steps) with an abundance of DME.  On evaluation, pt is mod A for bed mobility using the log roll technique, and min A with RW for sit <> stand transfers and ambulatory transfer x343ft bed>chair.  Pt is significantly fatigued and SOB following mobility, though SaO2 maintained 100% on 2L supplemental O2.  Pt is limited by generalized weakness, decreased functional mobility, and lethargy and is unable to perform at PLOF at this time. Pt would benefit from skilled PT to address noted impairments and functional limitations.  Recommend pt discharge to STR for optimal return to PLOF when medically appropriate.     Follow Up Recommendations SNF    Equipment Recommendations  None recommended by PT (pt has equipment)    Recommendations for Other Services       Precautions / Restrictions Precautions Precautions: Fall Precaution Comments: Cardiac defibrillator Restrictions Weight Bearing Restrictions: No      Mobility  Bed Mobility Overal bed mobility: Needs Assistance Bed Mobility: Supine to Sit Supine to sit: Mod assist General bed mobility comments: Pt  educated in log roll technique and able to perform with mod A under bilateral shoulders for transition from sidelying to sitting. Pt significantly SOB following transfer to EOB; SaO2 100% on 2L supplemental O2. Increased time, vc's, and encouragement required.  Transfers Overall transfer level: Needs assistance Equipment used: Rolling walker (2 wheeled) Transfers: Sit to/from Stand Sit to Stand: Min assist General transfer comment: Vc's for hand/foot placement, DME use, and sequencing. Increased time required.   Ambulation/Gait Ambulation/Gait assistance: Min assist Ambulation Distance (Feet): 3 Feet Assistive device: Rolling walker (2 wheeled) Gait Pattern/deviations: Shuffle;Decreased stride length;Narrow base of support Gait velocity: Decreased General Gait Details: Pt able to perform several small, shuffle steps from EOB > chair. Pt notably fatigued and SOB following ambulatory transfer, though SaO2 maintained 100% on 2L O2. Vc's for DME use and technique. Distance limited by pt fatigue/lethargy.   Stairs    Wheelchair Mobility    Modified Rankin (Stroke Patients Only)       Balance Overall balance assessment: Needs assistance Sitting-balance support: Feet supported;Bilateral upper extremity supported Sitting balance-Leahy Scale: Good Standing balance support: Bilateral upper extremity supported (on RW) Standing balance-Leahy Scale: Fair      Pertinent Vitals/Pain Pain Assessment: 0-10 Pain Score: 7  Pain Location: Abdomen Pain Descriptors / Indicators: Aching Pain Intervention(s): Limited activity within patient's tolerance;Monitored during session;Repositioned  HR and O2 monitored throughout session and maintained WFL.     Home Living Family/patient expects to be discharged to:: Private residence Living Arrangements: Spouse/significant other Available Help at Discharge: Family Type of Home: House Home Access: Stairs to  enter Entrance Stairs-Rails:  Right;Left Entrance Stairs-Number of Steps: 3, then landing, then 4 more Home Layout: One level Home Equipment: Walker - 2 wheels;Walker - 4 wheels;Walker - standard;Cane - single point;Bedside commode;Shower seat;Grab bars - toilet;Grab bars - tub/shower;Wheelchair - manual;Transport chair;Hospital bed; Trapeze bar for over bed    Prior Function Level of Independence: Independent;Independent with assistive device(s)  Comments: PRN use of AD, PRN use of 2L supplemental O2, PRN assist from wife     Hand Dominance   Dominant Hand: Right    Extremity/Trunk Assessment   Upper Extremity Assessment: Generalized weakness  Tremor noted with RUE AROM; unsure if this is baseline or related to CIWA    Lower Extremity Assessment: Generalized weakness Strength at least 3-/5 throughout, no deficits noted in light touch sensation.  Cervical / Trunk Assessment: Normal    Communication   Communication: HOH  Cognition Arousal/Alertness: Lethargic Behavior During Therapy: Flat affect Overall Cognitive Status: Within Functional Limits for tasks assessed    General Comments Nursing cleared pt for participation in physical therapy.  Pt agreeable to PT session. Pt's wife present throughout evaluation and involved in pt's care.    Exercises Pt able to perform bilateral ankle pumps x10 repetitions each LE. Pt then reporting an increase in abdominal pain with bilateral LE exercises to 10/10 (attempted heel slides, SLR, hip abd/add, SAQ), thus LE exercises were discontinued this date. Pain subsided to resting 7/10 with cessation of exercise.       Assessment/Plan    PT Assessment Patient needs continued PT services  PT Diagnosis Difficulty walking;Generalized weakness;Acute pain   PT Problem List Decreased strength;Decreased activity tolerance;Decreased balance;Decreased mobility;Decreased knowledge of use of DME  PT Treatment Interventions DME instruction;Gait training;Stair training;Functional  mobility training;Therapeutic activities;Therapeutic exercise;Balance training;Patient/family education   PT Goals (Current goals can be found in the Care Plan section) Acute Rehab PT Goals Patient Stated Goal: To go home PT Goal Formulation: With patient Time For Goal Achievement: 12/23/15 Potential to Achieve Goals: Fair    Frequency Min 2X/week   Barriers to discharge Assist levels       End of Session Equipment Utilized During Treatment: Gait belt;Oxygen (2L O2) Activity Tolerance: Patient limited by fatigue;Patient limited by pain;Patient limited by lethargy Patient left: in chair;with call bell/phone within reach;with chair alarm set;with family/visitor present Nurse Communication: Mobility status;Precautions         Time: 1420-1441 PT Time Calculation (min) (ACUTE ONLY): 21 min   Charges:         PT G Codes:        Chrissi Crow, SPT 12/09/2015, 3:12 PM

## 2015-12-09 NOTE — Consult Note (Signed)
Patient with pancreatitis that is getting better, his abd is a little less distended. Pt had only 3 bowel movements today (C. Diff colitis).  Breathing some better.  No new suggestions, continue vancomycin for a 14 day course.

## 2015-12-09 NOTE — Progress Notes (Signed)
[facility] - [place] at [facility]   PATIENT NAME: Robert Ball    MR#:  [id number]  DATE OF BIRTH:  10/14/1945  SUBJECTIVE:  CHIEF COMPLAINT:   Chief Complaint  Patient presents with  . Allergic Reaction   Patient is 70 year old Caucasian male with medical history significant for history of COPD, coronary artery disease, cardiac defibrillator, hyperlipidemia, gastroesophageal reflux disease, depression, chronic diarrhea, abdominal pain, who presents to the hospital after he was noted to have swelling and rash as well as shortness of breath undergoing CT scan of the abdomen with contrast. He was brought to emergency room for further evaluation, received 2 doses of epinephrine and albuterol, but his airway was compromised and therefore he was intubated and placed on mechanical and ventilation and admitted to intensive care unit. The patient was extubated on 12/06/2015. He feels somewhat better, still feels weak, garbled speech, somnolent. Admits of some abdominal pain. CT of abdomen revealed thickened urinary bladder, hepatic steatosis, some pancreatic calcifications. Patient's labs revealed elevated lipase as well as amylase and AST, gastroenterology consultation was requested. Dr. Mechele CollinElliott saw patient in consultation and recommended supportive care, await for stool cultures, get fecal elastase stool test to rule out pancreatic insufficiency.  Patient feels comfortable today, however remains sleepy.Less Abdominal pain, less cough, no sputum production Review of Systems  Unable to perform ROS: Medical condition    VITAL SIGNS: Blood pressure 137/61, pulse 74, temperature 97.7 F (36.5 C), temperature source Oral, resp. rate (!) 22, height 5\' 10"  (1.778 m), weight 83.6 kg (184 lb 4.8 oz), SpO2 99 %.  PHYSICAL EXAMINATION:   GENERAL:  70 y.o.-year-old patient lying in the bed with no acute distress. Remains somnolent and has slurry speech, essentially nonverbal, just  nods to questions or shake his head. Good air entrance bilaterally EYES: Pupils equal, round, reactive to light and accommodation. No scleral icterus. Extraocular muscles intact.  HEENT: Head atraumatic, normocephalic. Oropharynx and nasopharynx clear.  NECK:  Supple, no jugular venous distention. No thyroid enlargement, no tenderness.  LUNGS: Normal breath sounds bilaterally, no wheezing, rales,rhonchi or crepitation. No use of accessory muscles of respiration.  CARDIOVASCULAR: S1, S2 normal. No murmurs, rubs, or gallops.  ABDOMEN: Moderately firm, nontender, no rebound or guarding noted, some distended. Bowel sounds present. No organomegaly or mass.  EXTREMITIES: No pedal edema, cyanosis, or clubbing.  NEUROLOGIC: Cranial nerves II through XII are intact. Muscle strength 5/5 in all extremities. Sensation intact. Gait not checked.  PSYCHIATRIC: The patient is somnolent, difficult to assess orientation SKIN: No obvious rash, lesion, or ulcer.   ORDERS/RESULTS REVIEWED:   CBC  Recent Labs Lab 12/05/15 2011 12/06/15 0137 12/07/15 0504 12/08/15 0726 12/09/15 0546  WBC 27.0* 24.4* 7.3 7.8 7.2  HGB 15.1 15.4 12.8* 11.3* 10.6*  HCT 44.2 45.1 37.0* 32.3* 30.6*  PLT 294 247 113* 110* 119*  MCV 92.3 92.6 91.4 92.0 92.0  MCH 31.5 31.7 31.5 32.3 31.8  MCHC 34.1 34.2 34.4 35.1 34.5  RDW 13.9 14.3 13.7 13.7 13.8   ------------------------------------------------------------------------------------------------------------------  Chemistries   Recent Labs Lab 12/06/15 0137 12/06/15 2119 12/07/15 0504 12/07/15 1609 12/08/15 0726 12/09/15 0546  NA 134* 135  --  133* 132* 134*  K 4.7 4.2  --  4.1 3.7 3.5  CL 105 106  --  101 99* 98*  CO2 21* 20*  --  19* 17* 21*  GLUCOSE 253* 110*  --  116* 65 71  BUN 15 18  --  19 24* 23*  CREATININE 2.00* 1.47*  --  1.45* 1.41* 1.22  CALCIUM 8.5* 8.5*  --  8.4* 8.5* 8.2*  MG 1.6* 1.6*  --   --   --   --   AST  --   --  45* 57*  --  30  ALT  --    --  12* 15*  --  15*  ALKPHOS  --   --  21* 21*  --  22*  BILITOT  --   --  1.0 1.6*  --  2.0*   ------------------------------------------------------------------------------------------------------------------ estimated creatinine clearance is 56.5 mL/min (by C-G formula based on SCr of 1.22 mg/dL). ------------------------------------------------------------------------------------------------------------------ No results for input(s): TSH, T4TOTAL, T3FREE, THYROIDAB in the last 72 hours.  Invalid input(s): FREET3  Cardiac Enzymes No results for input(s): CKMB, TROPONINI, MYOGLOBIN in the last 168 hours.  Invalid input(s): CK ------------------------------------------------------------------------------------------------------------------ Invalid input(s): POCBNP ---------------------------------------------------------------------------------------------------------------  RADIOLOGY: No results found.  EKG:  Orders placed or performed during the hospital encounter of 06/27/15  . EKG 12-Lead  . EKG 12-Lead  . ED EKG  . ED EKG    ASSESSMENT AND PLAN:  Active Problems:   Anaphylactic shock #1. Anaphylactic shock, requiring intubation and mechanical ventilation, extubated fourth of August 2017, doing relatively well, continue  prednisone, Nebulizers, supportive therapy, get physical therapist involved for recommendations. Discontinue Benadryl due to continuous sedation #2. Diffuse abdominal pain, due to chronic pancreatitis, supportive therapy, get stool elastase  testing to rule out pancreatic insufficiency, initiate Creon if needed #3. Acute renal failure, resolved with  with medications,  renal ultrasound performed during this admission, no hydronephrosis noted #4. Elevated transaminase level, likely fatty liver disease due to obesity, significant alcohol abuse, per patient's family member, supportive therapy, improving LFTs #5. Diarrhea, was initially suspected to be exocrine  pancreatic insufficiency, stool elastase level  to rule out pancreatic insufficiency is pending, initiate Creon as needed, C. difficile testing revealed C. difficile positivity, initiated on vancomycin orally, follow clinically #. Generalized weakness, physical therapist recommendations are pending, patient however is agreeable for rehabilitation placement Management plans discussed with the patient, family and they are in agreement.   DRUG ALLERGIES:  Allergies  Allergen Reactions  . Contrast Media [Iodinated Diagnostic Agents] Anaphylaxis    Angioedema  . Aspirin Other (See Comments) and Itching    Pt is unable to take this medication.    . Ciprofloxacin Hcl Other (See Comments)    Reaction:  Unknown   . Ibuprofen Other (See Comments) and Itching    Pt is unable to take this medication.    . Metronidazole Other (See Comments)    Reaction:  Unknown   . Naproxen Other (See Comments)    Pt is unable to take this medication.    . Oxycodone Itching  . Tetracyclines & Related Other (See Comments)    Reaction:  Unknown   . Ciprofloxacin Rash  . Nitrofurantoin Rash  . Sulfa Antibiotics Rash  . Tetracycline Rash    CODE STATUS:     Code Status Orders        Start     Ordered   12/05/15 1928  Full code  Continuous     12/05/15 1929    Code Status History    Date Active Date Inactive Code Status Order ID Comments User Context   04/09/2015 10:30 PM 04/11/2015  6:23 PM Full Code [id number]  Houston Siren, MD Inpatient   12/27/2014  3:17 PM 12/27/2014  7:47 PM Full Code [id number]  Marcina Millard,  MD Inpatient      TOTAL TIME TAKING CARE OF THIS PATIENT: 35 minutes.   Discussed with patient's family Kaleesi Guyton M.D on 12/09/2015 at 3:22 PM  Between 7am to 6pm - Pager - [phone]  After 6pm go to [email or web address] - password [email or web address] [facility]  [place] [facility]  Office  [phone]  CC: Primary care physician; Mickey Farber, MD

## 2015-12-10 LAB — BASIC METABOLIC PANEL
Anion gap: 14 (ref 5–15)
BUN: 21 mg/dL — AB (ref 6–20)
CHLORIDE: 95 mmol/L — AB (ref 101–111)
CO2: 21 mmol/L — AB (ref 22–32)
Calcium: 8.7 mg/dL — ABNORMAL LOW (ref 8.9–10.3)
Creatinine, Ser: 1.12 mg/dL (ref 0.61–1.24)
GFR calc Af Amer: >60 mL/min (ref >60)
GFR calc non Af Amer: >60 mL/min (ref >60)
Glucose, Bld: 143 mg/dL — ABNORMAL HIGH (ref 65–99)
POTASSIUM: 3.2 mmol/L — AB (ref 3.5–5.1)
SODIUM: 130 mmol/L — AB (ref 135–145)

## 2015-12-10 LAB — GLUCOSE, CAPILLARY
GLUCOSE-CAPILLARY: 164 mg/dL — AB (ref 65–99)
GLUCOSE-CAPILLARY: 129 mg/dL — AB (ref 65–99)
Glucose-Capillary: 148 mg/dL — ABNORMAL HIGH (ref 65–99)

## 2015-12-10 MED ORDER — PREDNISONE 20 MG PO TABS
50.0000 mg | ORAL_TABLET | Freq: Every day | ORAL | Status: DC
  Administered 2015-12-11: 50 mg via ORAL
  Filled 2015-12-10: qty 2

## 2015-12-10 MED ORDER — ALBUTEROL SULFATE (2.5 MG/3ML) 0.083% IN NEBU
2.5000 mg | INHALATION_SOLUTION | RESPIRATORY_TRACT | Status: DC
  Administered 2015-12-10 – 2015-12-11 (×7): 2.5 mg via RESPIRATORY_TRACT
  Filled 2015-12-10 (×7): qty 3

## 2015-12-10 MED ORDER — BUDESONIDE 0.25 MG/2ML IN SUSP
0.2500 mg | Freq: Two times a day (BID) | RESPIRATORY_TRACT | Status: DC
  Administered 2015-12-10 – 2015-12-11 (×2): 0.25 mg via RESPIRATORY_TRACT
  Filled 2015-12-10 (×2): qty 2

## 2015-12-10 MED ORDER — GLUCERNA SHAKE PO LIQD
237.0000 mL | Freq: Three times a day (TID) | ORAL | Status: DC
  Administered 2015-12-10 – 2015-12-11 (×4): 237 mL via ORAL

## 2015-12-10 MED ORDER — MIRTAZAPINE 15 MG PO TABS
15.0000 mg | ORAL_TABLET | Freq: Every day | ORAL | Status: DC
  Administered 2015-12-10: 15 mg via ORAL
  Filled 2015-12-10: qty 1

## 2015-12-10 MED ORDER — POTASSIUM CHLORIDE CRYS ER 20 MEQ PO TBCR
40.0000 meq | EXTENDED_RELEASE_TABLET | Freq: Once | ORAL | Status: AC
  Administered 2015-12-10: 40 meq via ORAL
  Filled 2015-12-10: qty 2

## 2015-12-10 NOTE — Progress Notes (Signed)
[facility] - [place] [facility]   PATIENT NAME: Robert Ball    MR#:  [id number]  DATE OF BIRTH:  [birth year]  SUBJECTIVE:  CHIEF COMPLAINT:   Chief Complaint  Patient presents with  . Allergic Reaction   Patient is 70 year old Caucasian male with medical history significant for history of COPD, coronary artery disease, cardiac defibrillator, hyperlipidemia, gastroesophageal reflux disease, depression, chronic diarrhea, abdominal pain, who presents to the hospital after he was noted to have swelling and rash as well as shortness of breath undergoing CT scan of the abdomen with contrast. He was brought to emergency room for further evaluation, received 2 doses of epinephrine and albuterol, but his airway was compromised and therefore he was intubated and placed on mechanical and ventilation and admitted to intensive care unit. The patient was extubated on 12/06/2015. He feels somewhat better, still feels weak, garbled speech, somnolent. Admits of some abdominal pain. CT of abdomen revealed thickened urinary bladder, hepatic steatosis, some pancreatic calcifications. Patient's labs revealed elevated lipase as well as amylase and AST, gastroenterology consultation was requested. Dr. Mechele Collin saw patient in consultation and recommended supportive care, await for stool cultures, get fecal elastase stool test to rule out pancreatic insufficiency.  Patient's C. difficile came back positive, patient was initiated on vancomycin orally, continues to have diarrheal stool and feels very uncomfortable about that. Respiratory status is also poor, according to patient, continues to have wheezing. Overall does not feel well Review of Systems  Unable to perform ROS: Medical condition  Constitutional: Negative for chills, fever and weight loss.  HENT: Negative for congestion.   Eyes: Negative for blurred vision and double vision.  Respiratory: Positive for shortness of breath and  wheezing. Negative for cough and sputum production.   Cardiovascular: Negative for chest pain, palpitations, orthopnea, leg swelling and PND.  Gastrointestinal: Positive for abdominal pain and diarrhea. Negative for blood in stool, constipation, nausea and vomiting.  Genitourinary: Negative for dysuria, frequency, hematuria and urgency.  Musculoskeletal: Negative for falls.  Neurological: Negative for dizziness, tremors, focal weakness and headaches.  Endo/Heme/Allergies: Does not bruise/bleed easily.  Psychiatric/Behavioral: Negative for depression. The patient does not have insomnia.     VITAL SIGNS: Blood pressure 138/77, pulse 62, temperature 98.5 F (36.9 C), temperature source Oral, resp. rate (!) 28, height 5\' 10"  (1.778 m), weight 83.6 kg (184 lb 4.8 oz), SpO2 100 %.  PHYSICAL EXAMINATION:   GENERAL:  70 y.o.-year-old patient lying in the bed in moderate respiratory distress. Tachypneic, dyspneic, wheezing from the distance  EYES: Pupils equal, round, reactive to light and accommodation. No scleral icterus. Extraocular muscles intact.  HEENT: Head atraumatic, normocephalic. Oropharynx and nasopharynx clear.  NECK:  Supple, no jugular venous distention. No thyroid enlargement, no tenderness.  LUNGS: Some diminished breath sounds bilaterally, bilateral audible from the distance wheezing, few rales,rhonchi , but no crepitations. Using accessory muscles of respiration.  CARDIOVASCULAR: S1, S2 , tachycardic, distant, rhythm was regular. No murmurs, rubs, or gallops.  ABDOMEN: Softer, diffusely tender, no rebound , but voluntarily guarding was noted, distended. Bowel sounds present, diminished. No organomegaly or mass.  EXTREMITIES: No pedal edema, cyanosis, or clubbing.  NEUROLOGIC: Cranial nerves II through XII are intact. Muscle strength 5/5 in all extremities. Sensation intact. Gait not checked.  PSYCHIATRIC: The patient is alert, oriented 3 SKIN: No obvious rash, lesion, or ulcer.    ORDERS/RESULTS REVIEWED:   CBC  Recent Labs Lab 12/05/15 2011 12/06/15 6578 12/07/15 4696 12/08/15 2952 12/09/15 8413  WBC 27.0* 24.4* 7.3 7.8 7.2  HGB 15.1 15.4 12.8* 11.3* 10.6*  HCT 44.2 45.1 37.0* 32.3* 30.6*  PLT 294 247 113* 110* 119*  MCV 92.3 92.6 91.4 92.0 92.0  MCH 31.5 31.7 31.5 32.3 31.8  MCHC 34.1 34.2 34.4 35.1 34.5  RDW 13.9 14.3 13.7 13.7 13.8   ------------------------------------------------------------------------------------------------------------------  Chemistries   Recent Labs Lab 12/06/15 0137 12/06/15 2119 12/07/15 0504 12/07/15 1609 12/08/15 0726 12/09/15 0546 12/10/15 0541  NA 134* 135  --  133* 132* 134* 130*  K 4.7 4.2  --  4.1 3.7 3.5 3.2*  CL 105 106  --  101 99* 98* 95*  CO2 21* 20*  --  19* 17* 21* 21*  GLUCOSE 253* 110*  --  116* 65 71 143*  BUN 15 18  --  19 24* 23* 21*  CREATININE 2.00* 1.47*  --  1.45* 1.41* 1.22 1.12  CALCIUM 8.5* 8.5*  --  8.4* 8.5* 8.2* 8.7*  MG 1.6* 1.6*  --   --   --   --   --   AST  --   --  45* 57*  --  30  --   ALT  --   --  12* 15*  --  15*  --   ALKPHOS  --   --  21* 21*  --  22*  --   BILITOT  --   --  1.0 1.6*  --  2.0*  --    ------------------------------------------------------------------------------------------------------------------ estimated creatinine clearance is 61.6 mL/min (by C-G formula based on SCr of 1.12 mg/dL). ------------------------------------------------------------------------------------------------------------------ No results for input(s): TSH, T4TOTAL, T3FREE, THYROIDAB in the last 72 hours.  Invalid input(s): FREET3  Cardiac Enzymes No results for input(s): CKMB, TROPONINI, MYOGLOBIN in the last 168 hours.  Invalid input(s): CK ------------------------------------------------------------------------------------------------------------------ Invalid input(s):  POCBNP ---------------------------------------------------------------------------------------------------------------  RADIOLOGY: No results found.  EKG:  Orders placed or performed during the hospital encounter of 06/27/15  . EKG 12-Lead  . EKG 12-Lead  . ED EKG  . ED EKG    ASSESSMENT AND PLAN:  Active Problems:   Anaphylactic shock #1. Anaphylactic shock, requiring intubation and mechanical ventilation, extubated fourth of August 2017, doing Poorly today due to worsening respiratory status, advance prednisone, continue Nebulizers, supportive therapy, get physical therapist involved for recommendations. Now off Benadryl due to continuous sedation. Repeat chest x-ray #2. Diffuse abdominal pain, due to chronic pancreatitis, Suspected alcoholic gastritis, as well as C. difficile gastroenteritis, continue vancomycin orally, supportive therapy, awaiting for stool elastase  testing to rule out pancreatic insufficiency, initiate Creon if needed #3. Acute renal failure, resolved with  with medications,  renal ultrasound performed during this admission, no hydronephrosis noted #4. Elevated transaminase level, likely fatty liver disease due to obesity, alcoholic hepatitis,  supportive therapy, improving LFTs #5. Diarrhea, was initially suspected to be exocrine pancreatic insufficiency, stool elastase level  to rule out pancreatic insufficiency is pending, initiate Creon as needed, C. difficile testing revealed C. difficile positivity, initiated on vancomycin orally, not improving clinically, initiate Glucerna #6. Generalized weakness, physical therapist recommendations are pending, patient however is agreeable for rehabilitation placement, sickle therapy session was held today due to to medical issues #7. Acute respiratory failure with hypoxia, could be related to distended abdomen,, get a chest x-ray, continue nebulizers, advance prednisone to 50 mg daily dose, follow clinically  Management plans  discussed with the patient, family and they are in agreement.   DRUG ALLERGIES:  Allergies  Allergen Reactions  . Contrast Media [  Iodinated Diagnostic Agents] Anaphylaxis    Angioedema  . Aspirin Other (See Comments) and Itching    Pt is unable to take this medication.    . Ciprofloxacin Hcl Other (See Comments)    Reaction:  Unknown   . Ibuprofen Other (See Comments) and Itching    Pt is unable to take this medication.    . Metronidazole Other (See Comments)    Reaction:  Unknown   . Naproxen Other (See Comments)    Pt is unable to take this medication.    . Oxycodone Itching  . Tetracyclines & Related Other (See Comments)    Reaction:  Unknown   . Ciprofloxacin Rash  . Nitrofurantoin Rash  . Sulfa Antibiotics Rash  . Tetracycline Rash    CODE STATUS:     Code Status Orders        Start     Ordered   12/05/15 1928  Full code  Continuous     12/05/15 1929    Code Status History    Date Active Date Inactive Code Status Order ID Comments User Context   04/09/2015 10:30 PM 04/11/2015  6:23 PM Full Code [id number]  Houston Siren, MD Inpatient   12/27/2014  3:17 PM 12/27/2014  7:47 PM Full Code [id number]  Marcina Millard, MD Inpatient      TOTAL TIME TAKING CARE OF THIS PATIENT: 35 minutes.   Discussed with patient's family Shenekia Riess M.D on 12/10/2015 at 3:38 PM  Between 7am to 6pm - Pager - [phone]  After 6pm go to [email or web address] - password [email or web address] [facility]  [place] [facility]  Office  [phone]  CC: Primary care physician; Mickey Farber, MD

## 2015-12-10 NOTE — Progress Notes (Signed)
PT Cancellation Note  Patient Details Name: Robert Ball MRN: [id number] DOB: 06-01-1945   Cancelled Treatment:    Reason Eval/Treat Not Completed: Medical issues which prohibited therapy   Pt in bed receiving breathing treatment.  Pt SOB at rest with increased respirations.  Stated he is tired from poor sleep last night.  Session held.  [name] 12/10/2015, 1:23 PM

## 2015-12-10 NOTE — Progress Notes (Signed)
Dr. Tobi BastosPyreddy notified of Na+ 130 and K+ 3.2; New order written. Windy Carinaurner,Christophr Calix K, RN 6:45 AM 12/10/2015

## 2015-12-10 NOTE — Clinical Social Work Note (Signed)
Clinical Social Work Assessment  Patient Details  Name: Robert Ball MRN: [id number] Date of Birth: 11-Apr-1946  Date of referral:  12/10/15               Reason for consult:  Facility Placement                Permission sought to share information with:  Case Manager, Family Supports Permission granted to share information::  Yes, Verbal Permission Granted  Name::        Agency::     Relationship::     Contact Information:     Housing/Transportation Living arrangements for the past 2 months:  Single Family Home Source of Information:  Patient, Spouse Patient Interpreter Needed:  None Criminal Activity/Legal Involvement Pertinent to Current Situation/Hospitalization:  No - Comment as needed Significant Relationships:  Spouse Lives with:    Do you feel safe going back to the place where you live?  Yes Need for family participation in patient care:  Yes (Comment)  Care giving concerns:  Patient requires assistance with ADL's and lives with his spouse.   Social Worker assessment / plan:  CSW met with patient and his wife this morning to discuss PT recommendations. Patient stated he would not consider going to a facility at this time. Patient stated that he also was not interested in home health either. Patient's wife explained that the reason patient did not wish to have home health was that the previous time that home health was supposed to be arranged, hospice arrived instead. Both patient and his wife were not happy about this. Patient's wife offered that she has been a Psychologist, occupational for hospice for many years and that when they came out to see her husband, he informed them that his wife was able to take care of him. CSW inquired if patient's wife felt safe taking caring of patient as he would require much physical assistance. Patient's wife confirmed that she is able to take care of patient at home.   Employment status:  Retired Nurse, adult PT  Recommendations:  [place] / Referral to community resources:     Patient/Family's Response to care:  Patient and wife expressed appreciation for CSW assistance.   Patient/Family's Understanding of and Emotional Response to Diagnosis, Current Treatment, and Prognosis:  Patient and wife expressed understanding of patient's current physical condition. Patient made several angry facial expressions while CSW explained the PT recommendations and mouthed the word, "no" when asked if he would consider their recommendations.  Emotional Assessment Appearance:  Appears stated age Attitude/Demeanor/Rapport:  Guarded, Angry Affect (typically observed):  Angry Orientation:  Oriented to Self, Oriented to Place, Oriented to  Time, Oriented to Situation Alcohol / Substance use:  Not Applicable Psych involvement (Current and /or in the community):  No (Comment)  Discharge Needs  Concerns to be addressed:  Patient refuses services Readmission within the last 30 days:  No Current discharge risk:  Physical Impairment Barriers to Discharge:  No Barriers Identified   Robert Leff, LCSW 12/10/2015, 10:37 AM

## 2015-12-10 NOTE — Consult Note (Signed)
Patient lying in bed, resp rate reported at 28.  Global air flow decrease, yesterday labs hgb 10.2, K 3.2 glucose 143, plt 119.   Abd distended bowel sounds decreased, pt has much less tenderness, had 2-3 stools, still loose.  Pt on vancomycin.  Chronic pancreatitis with flare, now improved with fall in lipase and less abd pain.    He does have oxygen at home and a wife that looks after him carefully.  Not quite ready to go home yet.

## 2015-12-11 ENCOUNTER — Inpatient Hospital Stay: Payer: Medicare PPO

## 2015-12-11 DIAGNOSIS — R531 Weakness: Secondary | ICD-10-CM

## 2015-12-11 DIAGNOSIS — R451 Restlessness and agitation: Secondary | ICD-10-CM

## 2015-12-11 DIAGNOSIS — K7010 Alcoholic hepatitis without ascites: Secondary | ICD-10-CM

## 2015-12-11 DIAGNOSIS — K861 Other chronic pancreatitis: Secondary | ICD-10-CM

## 2015-12-11 DIAGNOSIS — J9601 Acute respiratory failure with hypoxia: Secondary | ICD-10-CM

## 2015-12-11 DIAGNOSIS — N179 Acute kidney failure, unspecified: Secondary | ICD-10-CM

## 2015-12-11 DIAGNOSIS — R1084 Generalized abdominal pain: Secondary | ICD-10-CM

## 2015-12-11 DIAGNOSIS — A0472 Enterocolitis due to Clostridium difficile, not specified as recurrent: Secondary | ICD-10-CM

## 2015-12-11 DIAGNOSIS — J441 Chronic obstructive pulmonary disease with (acute) exacerbation: Secondary | ICD-10-CM

## 2015-12-11 LAB — MAGNESIUM: Magnesium: 1.9 mg/dL (ref 1.7–2.4)

## 2015-12-11 LAB — BASIC METABOLIC PANEL
ANION GAP: 12 (ref 5–15)
BUN: 17 mg/dL (ref 6–20)
CALCIUM: 8.5 mg/dL — AB (ref 8.9–10.3)
CO2: 22 mmol/L (ref 22–32)
Chloride: 97 mmol/L — ABNORMAL LOW (ref 101–111)
Creatinine, Ser: 1.07 mg/dL (ref 0.61–1.24)
GLUCOSE: 111 mg/dL — AB (ref 65–99)
POTASSIUM: 3.1 mmol/L — AB (ref 3.5–5.1)
SODIUM: 131 mmol/L — AB (ref 135–145)

## 2015-12-11 LAB — GLUCOSE, CAPILLARY
GLUCOSE-CAPILLARY: 107 mg/dL — AB (ref 65–99)
Glucose-Capillary: 107 mg/dL — ABNORMAL HIGH (ref 65–99)
Glucose-Capillary: 184 mg/dL — ABNORMAL HIGH (ref 65–99)

## 2015-12-11 LAB — HEMOGLOBIN: Hemoglobin: 11.5 g/dL — ABNORMAL LOW (ref 13.0–18.0)

## 2015-12-11 LAB — PANCREATIC ELASTASE, FECAL: Pancreatic Elastase-1, Stool: 340 ug Elast./g (ref >200)

## 2015-12-11 MED ORDER — SACCHAROMYCES BOULARDII 250 MG PO CAPS: 250.0000 mg | ORAL_CAPSULE | Freq: Two times a day (BID) | ORAL | Status: DC

## 2015-12-11 MED ORDER — POTASSIUM CHLORIDE CRYS ER 20 MEQ PO TBCR
40.0000 meq | EXTENDED_RELEASE_TABLET | Freq: Four times a day (QID) | ORAL | Status: AC
  Administered 2015-12-11 (×2): 40 meq via ORAL
  Filled 2015-12-11 (×2): qty 2

## 2015-12-11 MED ORDER — ZIPRASIDONE HCL 20 MG PO CAPS
60.0000 mg | ORAL_CAPSULE | Freq: Every day | ORAL | Status: DC
  Administered 2015-12-11: 60 mg via ORAL
  Filled 2015-12-11 (×2): qty 3

## 2015-12-11 MED ORDER — LORAZEPAM 1 MG PO TABS: 1.0000 mg | ORAL_TABLET | Freq: Four times a day (QID) | ORAL | Status: DC | PRN

## 2015-12-11 MED ORDER — RISAQUAD PO CAPS: 1.0000 | ORAL_CAPSULE | Freq: Every day | ORAL | Status: DC

## 2015-12-11 NOTE — Discharge Summary (Signed)
[facility] [place] [facility]   PATIENT NAME: Robert Ball    MR#:  [id number]  DATE OF BIRTH:  [birth year]  DATE OF ADMISSION:  12/05/2015 ADMITTING PHYSICIAN: Merwyn Katos, MD  DATE OF DISCHARGE: 12/11/2015  5:00 PM  PRIMARY CARE PHYSICIAN: Mickey Farber, MD     ADMISSION DIAGNOSIS:  Anaphylactic shock, initial encounter [T78.2XXA]  DISCHARGE DIAGNOSIS:  Principal Problem:   Anaphylactic shock Active Problems:   Diffuse abdominal pain   COPD exacerbation (HCC)   Chronic pancreatitis (HCC)   Alcoholic hepatitis   Clostridium difficile enterocolitis   Acute renal failure (HCC)   Acute respiratory failure with hypoxia (HCC)   Agitation   Generalized weakness   SECONDARY DIAGNOSIS:   Past Medical History:  Diagnosis Date  . Anxiety   . Cardiac defibrillator in place   . COPD (chronic obstructive pulmonary disease) (HCC)    On 2liter oxygen  . Depression   . Elevated lipids   . GERD (gastroesophageal reflux disease)   . Headache   . Hypertension   . Prostate enlargement   . PTSD (post-traumatic stress disorder)   . Shakes     .pro HOSPITAL COURSE:   Patient is 70 year old Caucasian male with medical history significant for history of COPD, coronary artery disease, cardiac defibrillator, hyperlipidemia, gastroesophageal reflux disease, depression, chronic diarrhea, abdominal pain, who presents to the hospital after he was noted to have swelling and rash as well as shortness of breath undergoing CT scan of the abdomen with contrast. He was brought to emergency room for further evaluation, received 2 doses of epinephrine and albuterol, but his airway was compromised and therefore he was intubated and placed on mechanical and ventilation and admitted to intensive care unit. The patient was extubated on 12/06/2015. He admited of abdominal pain, diarrhea. CT of abdomen revealed thickened urinary bladder, hepatic steatosis, some pancreatic  calcifications, concerning for chronic alcoholic pancreatitis. Patient's labs revealed elevated lipase as well as amylase and AST, gastroenterology consultation was requested. Dr. Mechele Collin saw patient in consultation and recommended supportive care, get fecal elastase stool test to rule out pancreatic insufficiency. Stool cultures revealed C. diff infection, patient was initiated on vancomycin orally, but continued to have diarrheal stool, anorexia. He also continued to wheeze and cough intermittently and was treated for COPD exacerbation. Unfortunately, the patient grew progressively more  agitated and left hospital AMA. Discussion by problem: #1. Anaphylactic shock, requiring intubation and mechanical ventilation, extubated fourth of August 2017, doing poorly, but some better regarding respiratory status, he was continued on  prednisone, Nebulizers, supportive therapy while in the hospital, but decided to leave AMA.  Repeated chest x-ray revealed a persistent low lung volumes and mild bibasilar atelectasis.  #2. Diffuse abdominal pain, due to chronic pancreatitis,, questionable alcoholic  gastritis, hepatitis, as well as C. difficile gastroenteritis, he was continued on  vancomycin orally in the hospital, but  Vancomycin prescription was not given as patient left AMA, moreover , his oral intake was poor and diarrhea did not subside yet, stool elastase  testing to rule out pancreatic insufficiency is pending #3. Acute renal failure, resolved with medications,  renal ultrasound performed during this admission, no hydronephrosis noted, follow cr as outpatient closely as patient may dehydrate easily due to poor oral intake, diarrhea #4. Elevated transaminase level, likely fatty liver disease due to obesity, alcoholic hepatitis,  supportive therapy, improving LFTs with ETOH abstinence in  the hospital #5. Diarrhea, was initially suspected to be exocrine pancreatic insufficiency,  stool elastase level  to rule out  pancreatic insufficiency is pending, but C. difficile testing was positive for C. difficile PCR, unable to continue  vancomycin as left AMA #6. Generalized weakness, physical therapist recommended skilled nursing facility placement, however, patient refused  #7. Acute respiratory failure with hypoxia, chest x-ray showed persistent low lung volumes and mild bibasilar atelectasis, he was continued on  nebulizers, prednisone, improved, but not enough to be discharged from the hospital #8 agitation, resumed Geodon , but patient remained agitated and decided to leave AMA DISCHARGE CONDITIONS:   poor  CONSULTS OBTAINED:  Treatment Team:  Scot Jun, MD  DRUG ALLERGIES:   Allergies  Allergen Reactions  . Contrast Media [Iodinated Diagnostic Agents] Anaphylaxis    Angioedema  . Aspirin Other (See Comments) and Itching    Pt is unable to take this medication.    . Ciprofloxacin Hcl Other (See Comments)    Reaction:  Unknown   . Ibuprofen Other (See Comments) and Itching    Pt is unable to take this medication.    . Metronidazole Other (See Comments)    Reaction:  Unknown   . Naproxen Other (See Comments)    Pt is unable to take this medication.    . Oxycodone Itching  . Tetracyclines & Related Other (See Comments)    Reaction:  Unknown   . Ciprofloxacin Rash  . Nitrofurantoin Rash  . Sulfa Antibiotics Rash  . Tetracycline Rash    DISCHARGE MEDICATIONS:   Discharge Medication List as of 12/11/2015  5:11 PM    CONTINUE these medications which have NOT CHANGED   Details  acetaminophen (TYLENOL) 500 MG tablet Take 500 mg by mouth every 6 (six) hours as needed for mild pain or headache., Until Discontinued, Historical Med    albuterol (PROVENTIL HFA;VENTOLIN HFA) 108 (90 BASE) MCG/ACT inhaler Inhale 2 puffs into the lungs every 6 (six) hours as needed for wheezing or shortness of breath., Until Discontinued, Historical Med    albuterol (PROVENTIL) (2.5 MG/3ML) 0.083% nebulizer  solution Take 2.5 mg by nebulization every 6 (six) hours as needed for wheezing or shortness of breath., Until Discontinued, Historical Med    amLODipine (NORVASC) 10 MG tablet Take 10 mg by mouth daily., Until Discontinued, Historical Med    budesonide-formoterol (SYMBICORT) 160-4.5 MCG/ACT inhaler Inhale into the lungs., Until Discontinued, Historical Med    carvedilol (COREG) 12.5 MG tablet Take 12.5 mg by mouth 2 (two) times daily. , Until Discontinued, Historical Med    cetirizine (ZYRTEC) 10 MG tablet Take by mouth., Starting 07/26/2014, Until Discontinued, Historical Med    Cholecalciferol (VITAMIN D3) 5000 UNITS CAPS Take 5,000 Units by mouth every other day. Reported on 10/31/2015, Until Discontinued, Historical Med    fenofibrate 160 MG tablet Take 160 mg by mouth daily., Until Discontinued, Historical Med    furosemide (LASIX) 40 MG tablet Take 40 mg by mouth daily., Until Discontinued, Historical Med    loperamide (IMODIUM) 2 MG capsule Take 4 mg by mouth daily. , Until Discontinued, Historical Med    omeprazole (PRILOSEC) 20 MG capsule Take 20 mg by mouth 2 (two) times daily. , Until Discontinued, Historical Med    predniSONE (DELTASONE) 10 MG tablet Take 10 mg by mouth daily., Starting 09/20/2015, Until Discontinued, Historical Med    prochlorperazine (COMPAZINE) 5 MG tablet Take 5 mg by mouth 3 (three) times daily as needed for nausea or vomiting. , Until Discontinued, Historical Med    roflumilast (DALIRESP) 500 MCG TABS  tablet Take 500 mcg by mouth daily., Until Discontinued, Historical Med    sertraline (ZOLOFT) 100 MG tablet Take 100 mg by mouth 2 (two) times daily. Reported on 07/17/2015, Until Discontinued, Historical Med    tamsulosin (FLOMAX) 0.4 MG CAPS capsule Take 1 capsule (0.4 mg total) by mouth daily., Starting 07/09/2015, Until Discontinued, Fax    tiotropium (SPIRIVA) 18 MCG inhalation capsule Place 18 mcg into inhaler and inhale daily., Until Discontinued,  Historical Med    traMADol (ULTRAM) 50 MG tablet Take 1 tablet (50 mg total) by mouth every 6 (six) hours as needed., Starting 07/17/2015, Until Discontinued, Print    ziprasidone (GEODON) 60 MG capsule Take 60 mg by mouth at bedtime., Until Discontinued, Historical Med         DISCHARGE INSTRUCTIONS:    The patient is to follow up with PCP  If you experience worsening of your admission symptoms, develop shortness of breath, life threatening emergency, suicidal or homicidal thoughts you must seek medical attention immediately by calling 911 or calling your MD immediately  if symptoms less severe.  You Must read complete instructions/literature along with all the possible adverse reactions/side effects for all the Medicines you take and that have been prescribed to you. Take any new Medicines after you have completely understood and accept all the possible adverse reactions/side effects.   Please note  You were cared for by a hospitalist during your hospital stay. If you have any questions about your discharge medications or the care you received while you were in the hospital after you are discharged, you can call the unit and asked to speak with the hospitalist on call if the hospitalist that took care of you is not available. Once you are discharged, your primary care physician will handle any further medical issues. Please note that NO REFILLS for any discharge medications will be authorized once you are discharged, as it is imperative that you return to your primary care physician (or establish a relationship with a primary care physician if you do not have one) for your aftercare needs so that they can reassess your need for medications and monitor your lab values.    Today   CHIEF COMPLAINT:   Chief Complaint  Patient presents with  . Allergic Reaction    HISTORY OF PRESENT ILLNESS:  Robert Ball  is a 70 y.o. male with a known history of COPD, coronary artery disease,  cardiac defibrillator, hyperlipidemia, gastroesophageal reflux disease, depression, chronic diarrhea, abdominal pain, who presents to the hospital after he was noted to have swelling and rash as well as shortness of breath undergoing CT scan of the abdomen with contrast. He was brought to emergency room for further evaluation, received 2 doses of epinephrine and albuterol, but his airway was compromised and therefore he was intubated and placed on mechanical and ventilation and admitted to intensive care unit. The patient was extubated on 12/06/2015. He admited of abdominal pain, diarrhea. CT of abdomen revealed thickened urinary bladder, hepatic steatosis, some pancreatic calcifications, concerning for chronic alcoholic pancreatitis. Patient's labs revealed elevated lipase as well as amylase and AST, gastroenterology consultation was requested. Dr. Mechele Collin saw patient in consultation and recommended supportive care, get fecal elastase stool test to rule out pancreatic insufficiency. Stool cultures revealed C. diff infection, patient was initiated on vancomycin orally, but continued to have diarrheal stool, anorexia. He also continued to wheeze and cough intermittently and was treated for COPD exacerbation. Unfortunately, the patient grew progressively more  agitated and left  hospital AMA. Discussion by problem: #1. Anaphylactic shock, requiring intubation and mechanical ventilation, extubated fourth of August 2017, doing poorly, but some better regarding respiratory status, he was continued on  prednisone, Nebulizers, supportive therapy while in the hospital, but decided to leave AMA.  Repeated chest x-ray revealed a persistent low lung volumes and mild bibasilar atelectasis.  #2. Diffuse abdominal pain, due to chronic pancreatitis,, questionable alcoholic  gastritis, hepatitis, as well as C. difficile gastroenteritis, he was continued on  vancomycin orally in the hospital, but  Vancomycin prescription was not  given as patient left AMA, moreover , his oral intake was poor and diarrhea did not subside yet, stool elastase  testing to rule out pancreatic insufficiency is pending #3. Acute renal failure, resolved with medications,  renal ultrasound performed during this admission, no hydronephrosis noted, follow cr as outpatient closely as patient may dehydrate easily due to poor oral intake, diarrhea #4. Elevated transaminase level, likely fatty liver disease due to obesity, alcoholic hepatitis,  supportive therapy, improving LFTs with ETOH abstinence in  the hospital #5. Diarrhea, was initially suspected to be exocrine pancreatic insufficiency, stool elastase level  to rule out pancreatic insufficiency is pending, but C. difficile testing was positive for C. difficile PCR, unable to continue  vancomycin as left AMA #6. Generalized weakness, physical therapist recommended skilled nursing facility placement, however, patient refused  #7. Acute respiratory failure with hypoxia, chest x-ray showed persistent low lung volumes and mild bibasilar atelectasis, he was continued on  nebulizers, prednisone, improved, but not enough to be discharged from the hospital #8 agitation, resumed Geodon , but patient remained agitated and decided to leave AMA   VITAL SIGNS:  Blood pressure 128/68, pulse 83, temperature 98.2 F (36.8 C), temperature source Oral, resp. rate 18, height  (1.778 m), weight 83.6 kg (184 lb 4.8 oz), SpO2 99 %.  I/O:  No intake or output data in the 24 hours ending 12/11/15 1809  PHYSICAL EXAMINATION:  GENERAL:  70 y.o.-year-old patient lying in the bed with no acute distress.  EYES: Pupils equal, round, reactive to light and accommodation. No scleral icterus. Extraocular muscles intact.  HEENT: Head atraumatic, normocephalic. Oropharynx and nasopharynx clear.  NECK:  Supple, no jugular venous distention. No thyroid enlargement, no tenderness.  LUNGS: diminished breath sounds bilaterally,  scattered wheezing, rales,rhonchi , but no crepitations. Intermittent use of accessory muscles of respiration.  CARDIOVASCULAR: S1, S2 normal. No murmurs, rubs, or gallops.  ABDOMEN: Soft, non-tender, non-distended. Bowel sounds present. No organomegaly or mass.  EXTREMITIES: No pedal edema, cyanosis, or clubbing.  NEUROLOGIC: Cranial nerves II through XII are intact. Muscle strength 5/5 in all extremities. Sensation intact. Gait not checked.  PSYCHIATRIC: The patient is alert and oriented x 3. agitated and angry SKIN: No obvious rash, lesion, or ulcer.   DATA REVIEW:   CBC  Recent Labs Lab 12/09/15 0546 12/11/15 0538  WBC 7.2  --   HGB 10.6* 11.5*  HCT 30.6*  --   PLT 119*  --     Chemistries   Recent Labs Lab 12/09/15 0546  12/11/15 0538  NA 134*  < > 131*  K 3.5  < > 3.1*  CL 98*  < > 97*  CO2 21*  < > 22  GLUCOSE 71  < > 111*  BUN 23*  < > 17  CREATININE 1.22  < > 1.07  CALCIUM 8.2*  < > 8.5*  MG  --   --  1.9  AST 30  --   --  ALT 15*  --   --   ALKPHOS 22*  --   --   BILITOT 2.0*  --   --   < > = values in this interval not displayed.  Cardiac Enzymes No results for input(s): TROPONINI in the last 168 hours.  Microbiology Results  Results for orders placed or performed during the hospital encounter of 12/05/15  MRSA PCR Screening     Status: None   Collection Time: 12/05/15  9:00 PM  Result Value Ref Range Status   MRSA by PCR NEGATIVE NEGATIVE Final    Comment:        The GeneXpert MRSA Assay (FDA approved for NASAL specimens only), is one component of a comprehensive MRSA colonization surveillance program. It is not intended to diagnose MRSA infection nor to guide or monitor treatment for MRSA infections.   C difficile quick scan w PCR reflex     Status: Abnormal   Collection Time: 12/08/15 12:17 AM  Result Value Ref Range Status   C Diff antigen POSITIVE (A) NEGATIVE Final   C Diff toxin NEGATIVE NEGATIVE Final   C Diff interpretation  Results are indeterminate. See PCR results.  Final  Clostridium Difficile by PCR     Status: Abnormal   Collection Time: 12/08/15 12:17 AM  Result Value Ref Range Status   Toxigenic C Difficile by pcr POSITIVE (A) NEGATIVE Final    Comment: CRITICAL RESULT CALLED TO, READ BACK BY AND VERIFIED WITH: ROBIN BRIDGES ON 12/08/15 AT 0210 [facility]     RADIOLOGY:  Dg Chest Port 1 View  Result Date: 12/11/2015 CLINICAL DATA:  Respiratory distress. EXAM: PORTABLE CHEST 1 VIEW COMPARISON:  12/06/2015. FINDINGS: AICD in stable position. Heart size stable. Persistent low lung volumes with mild bibasilar atelectasis. No pleural effusion or pneumothorax. IMPRESSION: Persistent low lung volumes with mild basilar atelectasis . Electronically Signed   By: Maisie Fushomas  Register   On: 12/11/2015 06:33    EKG:   Orders placed or performed during the hospital encounter of 06/27/15  . EKG 12-Lead  . EKG 12-Lead  . ED EKG  . ED EKG      Management plans discussed with the patient, family and they are in agreement.  CODE STATUS:     Code Status Orders        Start     Ordered   12/05/15 1928  Full code  Continuous     12/05/15 1929    Code Status History    Date Active Date Inactive Code Status Order ID Comments User Context   04/09/2015 10:30 PM 04/11/2015  6:23 PM Full Code [id number]  Houston SirenVivek J Sainani, MD Inpatient   12/27/2014  3:17 PM 12/27/2014  7:47 PM Full Code [id number]  Marcina MillardAlexander Paraschos, MD Inpatient      TOTAL TIME TAKING CARE OF THIS PATIENT: 40 minutes.    Katharina CaperVAICKUTE,Mila Pair M.D on 12/11/2015 at 6:09 PM  Between 7am to 6pm - Pager - [phone]  After 6pm go to [email or web address] - password [email or web address] [email or web address]  [facility] [facility]  Office  [phone]  CC: Primary care physician; Mickey FarberHIES, DAVID, MD

## 2015-12-11 NOTE — Consult Note (Signed)
GI Inpatient Follow-up Note  Patient Identification: Robert Ball is a 70 y.o. male with a history of COPD, CAD, cardiac defibrillator, cirrhoisis, chronic EtOH pancreatitis, HLD, and GERD hyperlipidemia, GERD, depression, and  chronic diarrhea, admitted on 12/05/15 with anaphylaxis following a CT a/p with contrast (indic: chronic diarrhea x 6 months).  He was intubated on due to airway compromise, and subsequently extubated on 8/4.  Following extubation, respiratory status remained poor with continued wheezing.  CT a/p demonstrated calcifications in the pancrease c/w changes of chronic pancreatitis, as well as fatty liver infiltration.  Lipase and amylase elevated at 189 and 389, respectively.  On 8/6, C diff PCR returned positive.  Notably, pancreatic elastase was WNL (r/o exocrine panc insufficiency).  Abx therapy with IV vancomycin was initiated, in addition to IV fluids and pain management for supportive care.  Subjective: Robert Ball reports continued diffuse abdominal pain.  It is no better or worse than yesterday.  No nausea or vomiting.  Diarrhea continues, with 3 stools thus far today.  No frank blood in stool or melena.  Patient denies subjective fever or chills.  No additional GI concerns.   Scheduled Inpatient Medications:  . albuterol  2.5 mg Nebulization Q4H  . amLODipine  10 mg Oral Daily  . budesonide (PULMICORT) nebulizer solution  0.25 mg Nebulization BID  . carvedilol  12.5 mg Oral BID WC  . cholecalciferol  5,000 Units Oral Q48H  . feeding supplement (GLUCERNA SHAKE)  237 mL Oral TID BM  . fenofibrate  160 mg Oral Daily  . insulin aspart  0-15 Units Subcutaneous TID WC  . insulin aspart  0-5 Units Subcutaneous QHS  . mometasone-formoterol  2 puff Inhalation BID  . pantoprazole  40 mg Oral BID AC  . predniSONE  50 mg Oral Q breakfast  . roflumilast  500 mcg Oral Daily  . sertraline  100 mg Oral BID  . sucralfate  1 g Oral TID WC & HS  . tamsulosin  0.4 mg Oral Daily  .  tiotropium  18 mcg Inhalation Daily  . vancomycin  125 mg Oral QID  . ziprasidone  60 mg Oral QHS    Continuous Inpatient Infusions:     PRN Inpatient Medications:  sodium chloride, calcium carbonate, diphenhydrAMINE, ipratropium-albuterol, phenol, traMADol  Review of Systems: Constitutional: Weight is stable. + general fatigue/malaise Eyes: No changes in vision. ENT: No oral lesions, sore throat.  GI: see HPI.  Heme/Lymph: No easy bruising.  CV: No chest pain.  GU: No hematuria.  Integumentary: No rashes.  Neuro: No headaches.  Psych: No depression/anxiety.  Endocrine: No heat/cold intolerance.  Allergic/Immunologic: No urticaria.  Resp: No cough, SOB.  Musculoskeletal: No joint swelling.    Physical Examination: BP 128/68   Pulse 83   Temp 98.2 F (36.8 C) (Oral)   Resp 18   Ht 5\' 10"  (1.778 m)   Wt 83.6 kg (184 lb 4.8 oz)   SpO2 99%   BMI 26.44 kg/m  Gen: NAD, alert and oriented x 4 HEENT: PEERLA, EOMI, Neck: supple, no JVD or thyromegaly Chest: Diminished breath sounds bilaterally, audible distant wheezing, few rales, no rhonchi or other adventitious sounds CV: RRR, no m/g/c/r Abd: soft, NT, diffusely tender to light palpation throughout abdomen, +BS in all four quadrants; no HSM, guarding, ridigity, or rebound tenderness Ext: no edema, well perfused with 2+ pulses Skin: no rash or lesions noted Lymph: no LAD  Data: Lab Results  Component Value Date   WBC 7.2 12/09/2015  HGB 11.5 (L) 12/11/2015   HCT 30.6 (L) 12/09/2015   MCV 92.0 12/09/2015   PLT 119 (L) 12/09/2015    Recent Labs Lab 12/08/15 0726 12/09/15 0546 12/11/15 0538  HGB 11.3* 10.6* 11.5*   Lab Results  Component Value Date   NA 131 (L) 12/11/2015   K 3.1 (L) 12/11/2015   CL 97 (L) 12/11/2015   CO2 22 12/11/2015   BUN 17 12/11/2015   CREATININE 1.07 12/11/2015   Lab Results  Component Value Date   ALT 15 (L) 12/09/2015   AST 30 12/09/2015   ALKPHOS 22 (L) 12/09/2015    BILITOT 2.0 (H) 12/09/2015   No results for input(s): APTT, INR, PTT in the last 168 hours.   Assessment/Plan: Robert Ball is a 70 y.o. male with a history of COPD, CAD, cardiac defibrillator, cirrhoisis, chronic EtOH pancreatitis, HLD, and GERD hyperlipidemia, GERD, depression, and  chronic diarrhea admitted due to anaphylaxis, now with chronic pancreatitis and C diff infection.  No new complaints today, aside from continued diffuse abdominal pain and diarrhea.  Will continue with current treatment plan (below).  I discuss this patient with Dr. Mechele Collin as well, and consideration of a pulmonary consult was also recommended.  Will continue to follow.  Recommendations: - Consider pulmonary consultation for continue respiratory distress - Begin Probiotic (ordered) - Continue IV Vancomycin as scheduled - Continue supportive care  Patient has been discussed with Dr. Mechele Collin, pending further GI recommendations at this time. Please call with questions or concerns.  Burman Freestone, PA-C [facility] Phone: [phone] Pager: [phone]

## 2015-12-11 NOTE — Care Management Important Message (Signed)
Important Message  Patient Details  Name: Robert Ball MRN: [id number] Date of Birth: [birth year]   Medicare Important Message Given:  Yes    Chapman FitchBOWEN, [place] T, RN 12/11/2015, 12:00 PM

## 2015-12-11 NOTE — Progress Notes (Signed)
Physical Therapy Treatment Patient Details Name: Reece AgarBilly R Helman MRN: [id number] DOB: August 11, 1945 Today's Date: 12/11/2015    History of Present Illness Pt is a 70 yr old who presented to his PCP 8/3 with chronic diarrhea and abdominal pain x 6months. Pt underwent CT with contrast of the abdomen and later that evening was noted to have facial swelling and rash. Pt presented to the ED and was admitted with anaphylactic shock in response to IV contrast and was intubated 8/3 secondary to compromised airway. Pt was then extubated 8/4. PMH significant for COPD, CAD, cardiac defibrillator placement, GERD and h/o increased EtOH intake (pt with CIWA protocol in place).     PT Comments    Pt agrees to supine exercises as described below.  He stated he had just returned to bed with nursing and was fatigued.  Reported that he has been getting up to bedside commode and is able to ambulate around the bed at times.  He self-initiated rest breaks.  Fatigued with session.   Follow Up Recommendations  SNF     Equipment Recommendations  None recommended by PT    Recommendations for Other Services       Precautions / Restrictions Precautions Precautions: Fall Precaution Comments: Cardiac defibrillator Restrictions Weight Bearing Restrictions: No    Mobility  Bed Mobility               General bed mobility comments: declined  Transfers                 General transfer comment: declined  Ambulation/Gait                 Stairs            Wheelchair Mobility    Modified Rankin (Stroke Patients Only)       Balance                                    Cognition Arousal/Alertness: Awake/alert Behavior During Therapy: Flat affect Overall Cognitive Status: Within Functional Limits for tasks assessed                      Exercises Other Exercises Other Exercises: Participated in supine exercised x 5 BLE ankle pumps, heel slides, SLR  AB/Adduction    General Comments        Pertinent Vitals/Pain Pain Assessment: No/denies pain    Home Living                      Prior Function            PT Goals (current goals can now be found in the care plan section) Progress towards PT goals: Progressing toward goals    Frequency  Min 2X/week    PT Plan Current plan remains appropriate    Co-evaluation             End of Session   Activity Tolerance: Patient limited by fatigue Patient left: in bed;with call bell/phone within reach;with bed alarm set     Time: [id number] PT Time Calculation (min) (ACUTE ONLY): 11 min  Charges:  $Therapeutic Exercise: 8-22 mins                    G Codes:      Danielle DessSarah Neftaly Swiss, PTA 12/11/15, 1:16 PM

## 2015-12-11 NOTE — Care Management (Signed)
PT assessed patient again, and is still recommending SNF.  When I provided patient with his IM he states "No way no how am I going there".  I informed the patient that I would be following should home health be an appropriate discharge disposition.  Patient states "there is no use in you following I don't want that either and I am not changing my mind"

## 2015-12-11 NOTE — Progress Notes (Signed)
[facility] [place] [facility]   PATIENT NAME: Robert ChandlerBilly Guerin    MR#:  [id number]  DATE OF BIRTH:  10-Apr-1946  SUBJECTIVE:  CHIEF COMPLAINT:   Chief Complaint  Patient presents with  . Allergic Reaction   Patient is 70 year old Caucasian male with medical history significant for history of COPD, coronary artery disease, cardiac defibrillator, hyperlipidemia, gastroesophageal reflux disease, depression, chronic diarrhea, abdominal pain, who presents to the hospital after he was noted to have swelling and rash as well as shortness of breath undergoing CT scan of the abdomen with contrast. He was brought to emergency room for further evaluation, received 2 doses of epinephrine and albuterol, but his airway was compromised and therefore he was intubated and placed on mechanical and ventilation and admitted to intensive care unit. The patient was extubated on 12/06/2015. He feels somewhat better, still feels weak, garbled speech, somnolent. Admits of some abdominal pain. CT of abdomen revealed thickened urinary bladder, hepatic steatosis, some pancreatic calcifications. Patient's labs revealed elevated lipase as well as amylase and AST, gastroenterology consultation was requested. Dr. Mechele CollinElliott saw patient in consultation and recommended supportive care, await for stool cultures, get fecal elastase stool test to rule out pancreatic insufficiency.  Patient's C. difficile came back positive, patient was initiated on vancomycin orally, continues to have diarrheal stool and feels very uncomfortable about that. Respiratory status Has not improved, patient continues to wheeze and cough intermittently, dry cough, patient is agitated today, trying to get out from the hospital requests to be discharged. It appears that he is Geodon was not restarted, was initiated again today.   Review of Systems  Unable to perform ROS: Medical condition  Constitutional: Negative for chills, fever  and weight loss.  HENT: Negative for congestion.   Eyes: Negative for blurred vision and double vision.  Respiratory: Positive for shortness of breath and wheezing. Negative for cough and sputum production.   Cardiovascular: Negative for chest pain, palpitations, orthopnea, leg swelling and PND.  Gastrointestinal: Positive for abdominal pain and diarrhea. Negative for blood in stool, constipation, nausea and vomiting.  Genitourinary: Negative for dysuria, frequency, hematuria and urgency.  Musculoskeletal: Negative for falls.  Neurological: Negative for dizziness, tremors, focal weakness and headaches.  Endo/Heme/Allergies: Does not bruise/bleed easily.  Psychiatric/Behavioral: Negative for depression. The patient does not have insomnia.     VITAL SIGNS: Blood pressure 128/68, pulse 83, temperature 98.2 F (36.8 C), temperature source Oral, resp. rate 18, height 5\' 10"  (1.778 m), weight 83.6 kg (184 lb 4.8 oz), SpO2 99 %.  PHYSICAL EXAMINATION:   GENERAL:  70 y.o.-year-old patient lying in the bed in mild respiratory distress. Tachypneic, but appears more comfortable than he was yesterday  EYES: Pupils equal, round, reactive to light and accommodation. No scleral icterus. Extraocular muscles intact.  HEENT: Head atraumatic, normocephalic. Oropharynx and nasopharynx clear.  NECK:  Supple, no jugular venous distention. No thyroid enlargement, no tenderness.  LUNGS: Some diminished breath sounds bilaterally, bilateral scattered wheezing, few rales,rhonchi , but no crepitations. Intermittent use of accessory muscles of respiration.  CARDIOVASCULAR: S1, S2 , rhythm was regular. No murmurs, rubs, or gallops.  ABDOMEN: Softer, diffusely tender, no rebound , but voluntarily guarding was noted, distended. Bowel sounds present, diminished. No organomegaly or mass.  EXTREMITIES: No pedal edema, cyanosis, or clubbing.  NEUROLOGIC: Cranial nerves II through XII are intact. Muscle strength 5/5 in all  extremities. Sensation intact. Gait not checked.  PSYCHIATRIC: The patient is alert, oriented 3, agitated, restless, tremulous  SKIN: No obvious rash, lesion, or ulcer.   ORDERS/RESULTS REVIEWED:   CBC  Recent Labs Lab 12/05/15 2011 12/06/15 0137 12/07/15 0504 12/08/15 0726 12/09/15 0546 12/11/15 0538  WBC 27.0* 24.4* 7.3 7.8 7.2  --   HGB 15.1 15.4 12.8* 11.3* 10.6* 11.5*  HCT 44.2 45.1 37.0* 32.3* 30.6*  --   PLT 294 247 113* 110* 119*  --   MCV 92.3 92.6 91.4 92.0 92.0  --   MCH 31.5 31.7 31.5 32.3 31.8  --   MCHC 34.1 34.2 34.4 35.1 34.5  --   RDW 13.9 14.3 13.7 13.7 13.8  --    ------------------------------------------------------------------------------------------------------------------  Chemistries   Recent Labs Lab 12/06/15 0137 12/06/15 2119 12/07/15 0504 12/07/15 1609 12/08/15 0726 12/09/15 0546 12/10/15 0541 12/11/15 0538  NA 134* 135  --  133* 132* 134* 130* 131*  K 4.7 4.2  --  4.1 3.7 3.5 3.2* 3.1*  CL 105 106  --  101 99* 98* 95* 97*  CO2 21* 20*  --  19* 17* 21* 21* 22  GLUCOSE 253* 110*  --  116* 65 71 143* 111*  BUN 15 18  --  19 24* 23* 21* 17  CREATININE 2.00* 1.47*  --  1.45* 1.41* 1.22 1.12 1.07  CALCIUM 8.5* 8.5*  --  8.4* 8.5* 8.2* 8.7* 8.5*  MG 1.6* 1.6*  --   --   --   --   --  1.9  AST  --   --  45* 57*  --  30  --   --   ALT  --   --  12* 15*  --  15*  --   --   ALKPHOS  --   --  21* 21*  --  22*  --   --   BILITOT  --   --  1.0 1.6*  --  2.0*  --   --    ------------------------------------------------------------------------------------------------------------------ estimated creatinine clearance is 64.4 mL/min (by C-G formula based on SCr of 1.07 mg/dL). ------------------------------------------------------------------------------------------------------------------ No results for input(s): TSH, T4TOTAL, T3FREE, THYROIDAB in the last 72 hours.  Invalid input(s): FREET3  Cardiac Enzymes No results for input(s): CKMB,  TROPONINI, MYOGLOBIN in the last 168 hours.  Invalid input(s): CK ------------------------------------------------------------------------------------------------------------------ Invalid input(s): POCBNP ---------------------------------------------------------------------------------------------------------------  RADIOLOGY: Dg Chest Port 1 View  Result Date: 12/11/2015 CLINICAL DATA:  Respiratory distress. EXAM: PORTABLE CHEST 1 VIEW COMPARISON:  12/06/2015. FINDINGS: AICD in stable position. Heart size stable. Persistent low lung volumes with mild bibasilar atelectasis. No pleural effusion or pneumothorax. IMPRESSION: Persistent low lung volumes with mild basilar atelectasis . Electronically Signed   By: Maisie Fus  Register   On: 12/11/2015 06:33    EKG:  Orders placed or performed during the hospital encounter of 06/27/15  . EKG 12-Lead  . EKG 12-Lead  . ED EKG  . ED EKG    ASSESSMENT AND PLAN:  Active Problems:   Anaphylactic shock #1. Anaphylactic shock, requiring intubation and mechanical ventilation, extubated fourth of August 2017, doing poorly, but some better today regarding respiratory status, continue prednisone, Nebulizers, supportive therapy, getting pulmonologist to see patient again.  Repeated chest x-ray revealed a persistent low lung volumes and mild bibasilar atelectasis .  #2. Diffuse abdominal pain, due to chronic pancreatitis,, questionable alcoholic  gastritis, as well as C. difficile gastroenteritis, continue vancomycin orally,PPI, supportive therapy, awaiting for stool elastase  testing to rule out pancreatic insufficiency, initiate Creon if needed #3. Acute renal failure, resolved with medications,  renal ultrasound  performed during this admission, no hydronephrosis noted #4. Elevated transaminase level, likely fatty liver disease due to obesity, alcoholic hepatitis,  supportive therapy, improving LFTs #5. Diarrhea, was initially suspected to be exocrine  pancreatic insufficiency, stool elastase level  to rule out pancreatic insufficiency is pending, initiate Creon as needed, C. difficile testing revealed C. difficile positivity,continue  vancomycin orally, not improving clinically significantly, more over, has very poor oral intake, continue Glucerna #6. Generalized weakness, physical therapist recommended skilled nursing facility placement, however, patient refuses  #7. Acute respiratory failure with hypoxia, chest x-ray showed persistent low lung volumes and mild bibasilar atelectasis, continue nebulizers, prednisone, some better clinically  #8 agitation, resumed Geodon   Management plans discussed with the patient, family and they are in agreement.   DRUG ALLERGIES:  Allergies  Allergen Reactions  . Contrast Media [Iodinated Diagnostic Agents] Anaphylaxis    Angioedema  . Aspirin Other (See Comments) and Itching    Pt is unable to take this medication.    . Ciprofloxacin Hcl Other (See Comments)    Reaction:  Unknown   . Ibuprofen Other (See Comments) and Itching    Pt is unable to take this medication.    . Metronidazole Other (See Comments)    Reaction:  Unknown   . Naproxen Other (See Comments)    Pt is unable to take this medication.    . Oxycodone Itching  . Tetracyclines & Related Other (See Comments)    Reaction:  Unknown   . Ciprofloxacin Rash  . Nitrofurantoin Rash  . Sulfa Antibiotics Rash  . Tetracycline Rash    CODE STATUS:     Code Status Orders        Start     Ordered   12/05/15 1928  Full code  Continuous     12/05/15 1929    Code Status History    Date Active Date Inactive Code Status Order ID Comments User Context   04/09/2015 10:30 PM 04/11/2015  6:23 PM Full Code [id number]  Houston Siren, MD Inpatient   12/27/2014  3:17 PM 12/27/2014  7:47 PM Full Code [id number]  Marcina Millard, MD Inpatient      TOTAL TIME TAKING CARE OF THIS PATIENT:40 minutes.   Discussed with patient's  family Jerzi Tigert M.D on 12/11/2015 at 4:46 PM  Between 7am to 6pm - Pager - [phone]  After 6pm go to [email or web address] - password [email or web address] [facility]  [facility]  Office  [phone]  CC: Primary care physician; Mickey Farber, MD

## 2015-12-11 NOTE — Progress Notes (Signed)
Patient request writer to come take out his IV and that he was going home. Patient told that he was not ready for discharge. Patient stated that he was going home anyway. Dr Winona LegatoVaickute called and notified that patient was going home and was told that patient was not ready for discharge and that he will have to go out against medical advice. Patient informed that he is going out against medical advice and patient stated that he understands that he will be going out against medical advice and still wants to go home. Patient wife and patient signed AMA form. IV taken out of patient right hand IV site clean dry and intact.

## 2015-12-22 ENCOUNTER — Emergency Department
Admission: EM | Admit: 2015-12-22 | Discharge: 2015-12-22 | Disposition: A | Discharge status: Home / Self Care | Payer: Medicare PPO | Attending: Emergency Medicine | Admitting: Emergency Medicine

## 2015-12-22 ENCOUNTER — Encounter: Payer: Self-pay | Admitting: Emergency Medicine

## 2015-12-22 DIAGNOSIS — Z87891 Personal history of nicotine dependence: Secondary | ICD-10-CM | POA: Insufficient documentation

## 2015-12-22 DIAGNOSIS — E860 Dehydration: Secondary | ICD-10-CM | POA: Insufficient documentation

## 2015-12-22 DIAGNOSIS — N390 Urinary tract infection, site not specified: Secondary | ICD-10-CM | POA: Diagnosis not present

## 2015-12-22 DIAGNOSIS — J449 Chronic obstructive pulmonary disease, unspecified: Secondary | ICD-10-CM | POA: Diagnosis not present

## 2015-12-22 DIAGNOSIS — I5022 Chronic systolic (congestive) heart failure: Secondary | ICD-10-CM | POA: Insufficient documentation

## 2015-12-22 DIAGNOSIS — Z79899 Other long term (current) drug therapy: Secondary | ICD-10-CM | POA: Diagnosis not present

## 2015-12-22 DIAGNOSIS — N183 Chronic kidney disease, stage 3 (moderate): Secondary | ICD-10-CM | POA: Insufficient documentation

## 2015-12-22 DIAGNOSIS — I130 Hypertensive heart and chronic kidney disease with heart failure and stage 1 through stage 4 chronic kidney disease, or unspecified chronic kidney disease: Secondary | ICD-10-CM | POA: Insufficient documentation

## 2015-12-22 DIAGNOSIS — Z7951 Long term (current) use of inhaled steroids: Secondary | ICD-10-CM | POA: Diagnosis not present

## 2015-12-22 DIAGNOSIS — N289 Disorder of kidney and ureter, unspecified: Secondary | ICD-10-CM

## 2015-12-22 DIAGNOSIS — R531 Weakness: Secondary | ICD-10-CM

## 2015-12-22 LAB — URINALYSIS COMPLETE WITH MICROSCOPIC (ARMC ONLY)
BACTERIA UA: NONE SEEN
Bilirubin Urine: NEGATIVE
GLUCOSE, UA: NEGATIVE mg/dL
HGB URINE DIPSTICK: NEGATIVE
Ketones, ur: NEGATIVE mg/dL
NITRITE: NEGATIVE
Protein, ur: 100 mg/dL — AB
Specific Gravity, Urine: 1.012 (ref 1.005–1.030)
pH: 5 (ref 5.0–8.0)

## 2015-12-22 LAB — COMPREHENSIVE METABOLIC PANEL
ALBUMIN: 3.4 g/dL — AB (ref 3.5–5.0)
ALK PHOS: 41 U/L (ref 38–126)
ALT: 6 U/L — AB (ref 17–63)
AST: 14 U/L — AB (ref 15–41)
Anion gap: 7 (ref 5–15)
BILIRUBIN TOTAL: 0.8 mg/dL (ref 0.3–1.2)
BUN: 17 mg/dL (ref 6–20)
CALCIUM: 9 mg/dL (ref 8.9–10.3)
CO2: 22 mmol/L (ref 22–32)
CREATININE: 1.75 mg/dL — AB (ref 0.61–1.24)
Chloride: 100 mmol/L — ABNORMAL LOW (ref 101–111)
GFR calc Af Amer: 43 mL/min — ABNORMAL LOW (ref >60)
GFR calc non Af Amer: 37 mL/min — ABNORMAL LOW (ref >60)
GLUCOSE: 113 mg/dL — AB (ref 65–99)
Potassium: 3.5 mmol/L (ref 3.5–5.1)
Sodium: 129 mmol/L — ABNORMAL LOW (ref 135–145)
TOTAL PROTEIN: 7 g/dL (ref 6.5–8.1)

## 2015-12-22 LAB — CBC
HEMATOCRIT: 36.4 % — AB (ref 40.0–52.0)
HEMOGLOBIN: 12.7 g/dL — AB (ref 13.0–18.0)
MCH: 30.5 pg (ref 26.0–34.0)
MCHC: 34.7 g/dL (ref 32.0–36.0)
MCV: 87.8 fL (ref 80.0–100.0)
Platelets: 251 10*3/uL (ref 150–440)
RBC: 4.15 MIL/uL — ABNORMAL LOW (ref 4.40–5.90)
RDW: 14 % (ref 11.5–14.5)
WBC: 11.5 10*3/uL — ABNORMAL HIGH (ref 3.8–10.6)

## 2015-12-22 LAB — TROPONIN I: Troponin I: <0.03 ng/mL (ref <0.03)

## 2015-12-22 LAB — LIPASE, BLOOD: Lipase: 44 U/L (ref 11–51)

## 2015-12-22 LAB — GLUCOSE, CAPILLARY: Glucose-Capillary: 106 mg/dL — ABNORMAL HIGH (ref 65–99)

## 2015-12-22 MED ORDER — SODIUM CHLORIDE 0.9 % IV BOLUS (SEPSIS)
1000.0000 mL | Freq: Once | INTRAVENOUS | Status: AC
  Administered 2015-12-22: 1000 mL via INTRAVENOUS

## 2015-12-22 MED ORDER — FOSFOMYCIN TROMETHAMINE 3 G PO PACK
3.0000 g | PACK | Freq: Once | ORAL | Status: AC
  Administered 2015-12-22: 3 g via ORAL
  Filled 2015-12-22: qty 3

## 2015-12-22 NOTE — ED Provider Notes (Signed)
[facility] Emergency Department Provider Note  Time seen: 11:19 AM  I have reviewed the triage vital signs and the nursing notes.   HISTORY  Chief Complaint Weakness    HPI Robert Ball is a 70 y.o. male with a past medical history of anxiety, COPD, depression, gastric reflux, hypertension, PTSD, who presents the emergency department with generalized weakness and possible dehydration. According to the patient and his wife the patient was discharged from the hospital approximately 2 weeks ago after an admission for abdominal pain complicated by anaphylactic reaction following a CT scan requiring intubation and admission to the ICU. Patient states since going home he has had very little appetite, has not eaten a full meal in the last 2 weeks. States he has had intermittent vomiting while eating. States occasionally with upper abdominal pain although denies any currently. States he has not been drinking much, the wife notes that his urine has been very dark and he did not urinate for the past 12 hours which concerned the wife so she called EMS, but she states the patient did urinate just prior to EMS arrival. Wife is concerned that the patient is dehydrated. Patient denies any specific complaints. States occasional nausea but states that is been fairly constant for the past 2 weeks. Denies diarrhea. States he was diagnosed with C. difficile and completed his antibiotics, has not had any diarrhea since. Denies any chest pain or shortness of breath.    Past Medical History:  Diagnosis Date  . Anxiety   . Cardiac defibrillator in place   . COPD (chronic obstructive pulmonary disease) (HCC)    On 2liter oxygen  . Depression   . Elevated lipids   . GERD (gastroesophageal reflux disease)   . Headache   . Hypertension   . Prostate enlargement   . PTSD (post-traumatic stress disorder)   . Shakes     Patient Active Problem List   Diagnosis Date Noted  . COPD  exacerbation (HCC) 12/11/2015  . Chronic pancreatitis (HCC) 12/11/2015  . Diffuse abdominal pain 12/11/2015  . Alcoholic hepatitis 12/11/2015  . Clostridium difficile enterocolitis 12/11/2015  . Acute renal failure (HCC) 12/11/2015  . Generalized weakness 12/11/2015  . Acute respiratory failure with hypoxia (HCC) 12/11/2015  . Agitation 12/11/2015  . Anaphylactic shock 12/05/2015  . Nocturia 10/15/2015  . Arteriosclerosis of coronary artery 07/25/2015  . Benign prostatic hyperplasia with urinary obstruction 07/03/2015  . Chronic LBP 04/16/2015  . Abdominal pain 04/09/2015  . Clinical depression 01/31/2015  . Other long term (current) drug therapy 01/31/2015  . 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 Chest pain 01/02/2015  . Venous insufficiency of leg 01/02/2015  . Pulmonary hypertension (HCC) 03/20/2014  . Chronic systolic heart failure (HCC) 03/20/2014  . Allergic rhinitis 01/25/2014  . Back pain, chronic 01/25/2014  . Chronic diarrhea 01/25/2014  . Chronic kidney disease (CKD), stage III (moderate) 01/25/2014  . Benign essential HTN 01/25/2014  . Combined fat and carbohydrate induced hyperlipemia 01/25/2014  . Has a tremor 01/25/2014  . Oxygen desaturation 12/28/2013  . Subclinical hypothyroidism 12/28/2012  . Encounter for issue of repeat prescription 06/23/2012  . Acid reflux 12/17/2011  . Neurosis, posttraumatic 12/17/2011  . Persons encountering health services in other specified circumstances 12/17/2011  . Encounter for other specified special examinations 12/17/2011  . Colon polyp 12/10/2011  . Chronic obstructive pulmonary disease (HCC) 12/10/2011  . Gout 12/10/2011  . History of biliary T-tube placement 12/10/2011    Past Surgical History:  Procedure  Laterality Date  . BACK SURGERY    . CATARACT EXTRACTION W/ INTRAOCULAR LENS  IMPLANT, BILATERAL Bilateral   . EYE MUSCLE SURGERY Bilateral   . HEMORRHOID SURGERY    . HERNIA REPAIR    . ICD LEAD REMOVAL  Left 12/27/2014   Procedure: ICD LEAD REMOVAL/ICD GENERATOR CHANGE OUT;  Surgeon: Marcina MillardAlexander Paraschos, MD;  Location: [facility] ORS;  Service: Cardiovascular;  Laterality: Left;  . NECK SURGERY      Prior to Admission medications   Medication Sig Start Date End Date Taking? Authorizing Provider  acetaminophen (TYLENOL) 500 MG tablet Take 500 mg by mouth every 6 (six) hours as needed for mild pain or headache.    Historical Provider, MD  albuterol (PROVENTIL HFA;VENTOLIN HFA) 108 (90 BASE) MCG/ACT inhaler Inhale 2 puffs into the lungs every 6 (six) hours as needed for wheezing or shortness of breath.    Historical Provider, MD  albuterol (PROVENTIL) (2.5 MG/3ML) 0.083% nebulizer solution Take 2.5 mg by nebulization every 6 (six) hours as needed for wheezing or shortness of breath.    Historical Provider, MD  amLODipine (NORVASC) 10 MG tablet Take 10 mg by mouth daily.    Historical Provider, MD  budesonide-formoterol (SYMBICORT) 160-4.5 MCG/ACT inhaler Inhale into the lungs.    Historical Provider, MD  carvedilol (COREG) 12.5 MG tablet Take 12.5 mg by mouth 2 (two) times daily.     Historical Provider, MD  cetirizine (ZYRTEC) 10 MG tablet Take by mouth. 07/26/14   Historical Provider, MD  Cholecalciferol (VITAMIN D3) 5000 UNITS CAPS Take 5,000 Units by mouth every other day. Reported on 10/31/2015    Historical Provider, MD  fenofibrate 160 MG tablet Take 160 mg by mouth daily.    Historical Provider, MD  furosemide (LASIX) 40 MG tablet Take 40 mg by mouth daily.    Historical Provider, MD  loperamide (IMODIUM) 2 MG capsule Take 4 mg by mouth daily.     Historical Provider, MD  omeprazole (PRILOSEC) 20 MG capsule Take 20 mg by mouth 2 (two) times daily.     Historical Provider, MD  predniSONE (DELTASONE) 10 MG tablet Take 10 mg by mouth daily. 09/20/15   Historical Provider, MD  prochlorperazine (COMPAZINE) 5 MG tablet Take 5 mg by mouth 3 (three) times daily as needed for nausea or vomiting.      Historical Provider, MD  roflumilast (DALIRESP) 500 MCG TABS tablet Take 500 mcg by mouth daily.    Historical Provider, MD  sertraline (ZOLOFT) 100 MG tablet Take 100 mg by mouth 2 (two) times daily. Reported on 07/17/2015    Historical Provider, MD  tamsulosin (FLOMAX) 0.4 MG CAPS capsule Take 1 capsule (0.4 mg total) by mouth daily. Patient taking differently: Take 0.4 mg by mouth 2 (two) times daily.  07/09/15   Crist FatBenjamin W Herrick, MD  tiotropium (SPIRIVA) 18 MCG inhalation capsule Place 18 mcg into inhaler and inhale daily.    Historical Provider, MD  traMADol (ULTRAM) 50 MG tablet Take 1 tablet (50 mg total) by mouth every 6 (six) hours as needed. 07/17/15   Yevette EdwardsJames G Adams, MD  ziprasidone (GEODON) 60 MG capsule Take 60 mg by mouth at bedtime.    Historical Provider, MD    Allergies  Allergen Reactions  . Contrast Media [Iodinated Diagnostic Agents] Anaphylaxis    Angioedema  . Aspirin Other (See Comments) and Itching    Pt is unable to take this medication.    . Ciprofloxacin Hcl Other (See Comments)  Reaction:  Unknown   . Ibuprofen Other (See Comments) and Itching    Pt is unable to take this medication.    . Metronidazole Other (See Comments)    Reaction:  Unknown   . Naproxen Other (See Comments)    Pt is unable to take this medication.    . Oxycodone Itching  . Tetracyclines & Related Other (See Comments)    Reaction:  Unknown   . Ciprofloxacin Rash  . Nitrofurantoin Rash  . Sulfa Antibiotics Rash  . Tetracycline Rash    Family History  Problem Relation Age of Onset  . Heart disease Mother   . Heart disease Father   . Lymphoma Father     Social History Social History  Substance Use Topics  . Smoking status: Former Smoker    Packs/day: 5.00    Years: 40.00    Types: Cigarettes    Quit date: 09/24/2007  . Smokeless tobacco: Never Used  . Alcohol use 0.0 oz/week     Comment: daily, beer and hard liquor    Review of Systems Constitutional: Negative for  fever. Cardiovascular: Negative for chest pain. Respiratory: Negative for shortness of breath. Gastrointestinal: Negative for abdominal pain, vomiting and diarrhea. Genitourinary: Negative for dysuria.Decreased urination.  Musculoskeletal: Negative for back pain. Neurological: Negative for headache 10-point ROS otherwise negative.  ____________________________________________   PHYSICAL EXAM:  VITAL SIGNS: ED Triage Vitals  Enc Vitals Group     BP 12/22/15 1027 128/71     Pulse Rate 12/22/15 1027 81     Resp 12/22/15 1027 (!) 22     Temp 12/22/15 1027 97.6 F (36.4 C)     Temp Source 12/22/15 1027 Oral     SpO2 12/22/15 1027 98 %     Weight 12/22/15 1028 184 lb (83.5 kg)     Height 12/22/15 1028 5\' 10"  (1.778 m)     Head Circumference --      Peak Flow --      Pain Score 12/22/15 1030 0     Pain Loc --      Pain Edu? --      Excl. in GC? --     Constitutional: Alert and oriented. Well appearing and in no distress. Eyes: Normal exam ENT   Head: Normocephalic and atraumatic.   Mouth/Throat:Dry Mucous membranes.  Cardiovascular: Normal rate, regular rhythm. No murmur Respiratory: Normal respiratory effort without tachypnea nor retractions. Breath sounds are clear  Gastrointestinal:Soft, mild upper abdominal tenderness palpation. No rebound or guarding. No distention.  Musculoskeletal: Nontender with normal range of motion in all extremities Neurologic:  Normal speech and language. No gross focal neurologic deficits Skin:  Skin is warm, dry and intact.  Psychiatric: Mood and affect are normal.  ____________________________________________    EKG  EKG reviewed and interpreted by myself shows an atrial sensed ventricular rhythm at 83 bpm with a widened QRS, no concerning ST changes.  ____________________________________________   INITIAL IMPRESSION / ASSESSMENT AND PLAN / ED COURSE  Pertinent labs & imaging results that were available during my care of the  patient were reviewed by me and considered in my medical decision making (see chart for details).  The patient presents the emergency department generalized fatigue, weakness, decreased urination and concerns for dehydration. We'll check labs, IV hydrate while awaiting results. Patient has no specific complaints, has a largely normal physical exam besides dry mucous membranes.   Patient's labs are largely at baseline besides mild renal insufficiency and a urinary tract infection on  urinalysis. We will dose one dose fosfomycin in the emergency department in hopes to prevent prolonged antibiotics as the patient recently recovered from c. Diff. We will dose 2 L of normal saline in the emergency department and discharge home with PCP follow-up. Patient is agreeable to plan. ____________________________________________   FINAL CLINICAL IMPRESSION(S) / ED DIAGNOSES  Dehydration Acute renal insufficiency   Minna Antis, MD 12/22/15 1553

## 2015-12-22 NOTE — ED Triage Notes (Signed)
Pt arrived by EMS from home with c/o of generalized weakness that has been consistant since being discharged from the hospital earlier this month. Pt states had had n/v and diarrhea and has been unable to eat since being discharged. Pt has also been unable to tolerated fluids and has had "very little" intake.

## 2015-12-22 NOTE — ED Notes (Signed)
Pt verbalized understanding of discharge instructions. NAD at this time. 

## 2015-12-22 NOTE — ED Notes (Signed)
Pt on med hold. MD wants NS Bolus complete before discharge.

## 2015-12-23 LAB — URINE CULTURE

## 2015-12-25 ENCOUNTER — Ambulatory Visit: Payer: Medicare PPO | Admitting: Anesthesiology

## 2015-12-30 ENCOUNTER — Inpatient Hospital Stay
Admission: EM | Admit: 2015-12-30 | Discharge: 2016-01-10 | DRG: 438 | Disposition: A | Discharge status: Hospice | Payer: Medicare PPO | Attending: Internal Medicine | Admitting: Internal Medicine

## 2015-12-30 ENCOUNTER — Encounter: Payer: Self-pay | Admitting: Emergency Medicine

## 2015-12-30 DIAGNOSIS — G9341 Metabolic encephalopathy: Secondary | ICD-10-CM | POA: Diagnosis present

## 2015-12-30 DIAGNOSIS — F419 Anxiety disorder, unspecified: Secondary | ICD-10-CM | POA: Diagnosis present

## 2015-12-30 DIAGNOSIS — Z7189 Other specified counseling: Secondary | ICD-10-CM | POA: Diagnosis not present

## 2015-12-30 DIAGNOSIS — J441 Chronic obstructive pulmonary disease with (acute) exacerbation: Secondary | ICD-10-CM | POA: Diagnosis present

## 2015-12-30 DIAGNOSIS — Z9981 Dependence on supplemental oxygen: Secondary | ICD-10-CM | POA: Diagnosis not present

## 2015-12-30 DIAGNOSIS — F329 Major depressive disorder, single episode, unspecified: Secondary | ICD-10-CM | POA: Diagnosis present

## 2015-12-30 DIAGNOSIS — Z9841 Cataract extraction status, right eye: Secondary | ICD-10-CM | POA: Diagnosis not present

## 2015-12-30 DIAGNOSIS — R109 Unspecified abdominal pain: Secondary | ICD-10-CM

## 2015-12-30 DIAGNOSIS — K859 Acute pancreatitis without necrosis or infection, unspecified: Secondary | ICD-10-CM | POA: Diagnosis present

## 2015-12-30 DIAGNOSIS — I110 Hypertensive heart disease with heart failure: Secondary | ICD-10-CM | POA: Diagnosis present

## 2015-12-30 DIAGNOSIS — Z515 Encounter for palliative care: Secondary | ICD-10-CM | POA: Diagnosis not present

## 2015-12-30 DIAGNOSIS — I5022 Chronic systolic (congestive) heart failure: Secondary | ICD-10-CM | POA: Diagnosis present

## 2015-12-30 DIAGNOSIS — F4310 Post-traumatic stress disorder, unspecified: Secondary | ICD-10-CM | POA: Diagnosis present

## 2015-12-30 DIAGNOSIS — R4182 Altered mental status, unspecified: Secondary | ICD-10-CM

## 2015-12-30 DIAGNOSIS — Z95810 Presence of automatic (implantable) cardiac defibrillator: Secondary | ICD-10-CM | POA: Diagnosis not present

## 2015-12-30 DIAGNOSIS — E43 Unspecified severe protein-calorie malnutrition: Secondary | ICD-10-CM | POA: Diagnosis present

## 2015-12-30 DIAGNOSIS — K219 Gastro-esophageal reflux disease without esophagitis: Secondary | ICD-10-CM | POA: Diagnosis present

## 2015-12-30 DIAGNOSIS — Z66 Do not resuscitate: Secondary | ICD-10-CM | POA: Diagnosis not present

## 2015-12-30 DIAGNOSIS — R404 Transient alteration of awareness: Secondary | ICD-10-CM | POA: Diagnosis not present

## 2015-12-30 DIAGNOSIS — N400 Enlarged prostate without lower urinary tract symptoms: Secondary | ICD-10-CM | POA: Diagnosis present

## 2015-12-30 DIAGNOSIS — J9610 Chronic respiratory failure, unspecified whether with hypoxia or hypercapnia: Secondary | ICD-10-CM | POA: Diagnosis present

## 2015-12-30 DIAGNOSIS — Z9842 Cataract extraction status, left eye: Secondary | ICD-10-CM | POA: Diagnosis not present

## 2015-12-30 DIAGNOSIS — E876 Hypokalemia: Secondary | ICD-10-CM | POA: Diagnosis present

## 2015-12-30 DIAGNOSIS — Z79899 Other long term (current) drug therapy: Secondary | ICD-10-CM

## 2015-12-30 DIAGNOSIS — J449 Chronic obstructive pulmonary disease, unspecified: Secondary | ICD-10-CM | POA: Diagnosis present

## 2015-12-30 DIAGNOSIS — I429 Cardiomyopathy, unspecified: Secondary | ICD-10-CM | POA: Diagnosis present

## 2015-12-30 DIAGNOSIS — Z87891 Personal history of nicotine dependence: Secondary | ICD-10-CM

## 2015-12-30 DIAGNOSIS — Z6827 Body mass index (BMI) 27.0-27.9, adult: Secondary | ICD-10-CM | POA: Diagnosis not present

## 2015-12-30 DIAGNOSIS — I2510 Atherosclerotic heart disease of native coronary artery without angina pectoris: Secondary | ICD-10-CM | POA: Diagnosis present

## 2015-12-30 DIAGNOSIS — E785 Hyperlipidemia, unspecified: Secondary | ICD-10-CM | POA: Diagnosis present

## 2015-12-30 DIAGNOSIS — K861 Other chronic pancreatitis: Secondary | ICD-10-CM | POA: Diagnosis present

## 2015-12-30 DIAGNOSIS — R1010 Upper abdominal pain, unspecified: Secondary | ICD-10-CM

## 2015-12-30 DIAGNOSIS — Z789 Other specified health status: Secondary | ICD-10-CM | POA: Diagnosis not present

## 2015-12-30 DIAGNOSIS — R0602 Shortness of breath: Secondary | ICD-10-CM

## 2015-12-30 DIAGNOSIS — B379 Candidiasis, unspecified: Secondary | ICD-10-CM | POA: Diagnosis not present

## 2015-12-30 DIAGNOSIS — Z452 Encounter for adjustment and management of vascular access device: Secondary | ICD-10-CM

## 2015-12-30 DIAGNOSIS — Z961 Presence of intraocular lens: Secondary | ICD-10-CM | POA: Diagnosis present

## 2015-12-30 DIAGNOSIS — I10 Essential (primary) hypertension: Secondary | ICD-10-CM | POA: Diagnosis present

## 2015-12-30 LAB — COMPREHENSIVE METABOLIC PANEL
ALBUMIN: 3.3 g/dL — AB (ref 3.5–5.0)
ALT: 7 U/L — AB (ref 17–63)
AST: 14 U/L — AB (ref 15–41)
Alkaline Phosphatase: 41 U/L (ref 38–126)
Anion gap: 8 (ref 5–15)
BUN: 11 mg/dL (ref 6–20)
CALCIUM: 8.9 mg/dL (ref 8.9–10.3)
CHLORIDE: 103 mmol/L (ref 101–111)
CO2: 21 mmol/L — AB (ref 22–32)
CREATININE: 0.91 mg/dL (ref 0.61–1.24)
GFR calc Af Amer: >60 mL/min (ref >60)
GFR calc non Af Amer: >60 mL/min (ref >60)
GLUCOSE: 122 mg/dL — AB (ref 65–99)
Potassium: 2.9 mmol/L — ABNORMAL LOW (ref 3.5–5.1)
SODIUM: 132 mmol/L — AB (ref 135–145)
TOTAL PROTEIN: 6.3 g/dL — AB (ref 6.5–8.1)
Total Bilirubin: 1.1 mg/dL (ref 0.3–1.2)

## 2015-12-30 LAB — CBC WITH DIFFERENTIAL/PLATELET
BASOS ABS: 0 10*3/uL (ref 0–0.1)
Basophils Relative: 0 %
Eosinophils Absolute: 0.1 10*3/uL (ref 0–0.7)
Eosinophils Relative: 1 %
HEMATOCRIT: 38.2 % — AB (ref 40.0–52.0)
HEMOGLOBIN: 13 g/dL (ref 13.0–18.0)
LYMPHS PCT: 6 %
Lymphs Abs: 0.5 10*3/uL — ABNORMAL LOW (ref 1.0–3.6)
MCH: 29.5 pg (ref 26.0–34.0)
MCHC: 34 g/dL (ref 32.0–36.0)
MCV: 87 fL (ref 80.0–100.0)
MONO ABS: 0.7 10*3/uL (ref 0.2–1.0)
Monocytes Relative: 8 %
NEUTROS ABS: 7.7 10*3/uL — AB (ref 1.4–6.5)
Neutrophils Relative %: 85 %
Platelets: 212 10*3/uL (ref 150–440)
RBC: 4.4 MIL/uL (ref 4.40–5.90)
RDW: 14 % (ref 11.5–14.5)
WBC: 9 10*3/uL (ref 3.8–10.6)

## 2015-12-30 LAB — PROTIME-INR
INR: 1.21
Prothrombin Time: 15.4 seconds — ABNORMAL HIGH (ref 11.4–15.2)

## 2015-12-30 LAB — LIPASE, BLOOD: LIPASE: 104 U/L — AB (ref 11–51)

## 2015-12-30 MED ORDER — POTASSIUM CHLORIDE 10 MEQ/100ML IV SOLN
10.0000 meq | INTRAVENOUS | Status: AC
  Administered 2015-12-30 – 2015-12-31 (×4): 10 meq via INTRAVENOUS
  Filled 2015-12-30 (×4): qty 100

## 2015-12-30 MED ORDER — ONDANSETRON HCL 4 MG/2ML IJ SOLN
4.0000 mg | Freq: Once | INTRAMUSCULAR | Status: AC
  Administered 2015-12-30: 4 mg via INTRAVENOUS
  Filled 2015-12-30: qty 2

## 2015-12-30 MED ORDER — MORPHINE SULFATE (PF) 4 MG/ML IV SOLN
4.0000 mg | Freq: Once | INTRAVENOUS | Status: AC
  Administered 2015-12-30: 4 mg via INTRAVENOUS
  Filled 2015-12-30: qty 1

## 2015-12-30 NOTE — H&P (Signed)
[facility] [place] [facility]   PATIENT NAME: Robert Ball    MR#:  [id number]  DATE OF BIRTH:  16-Jul-1945  DATE OF ADMISSION:  12/30/2015  PRIMARY CARE PHYSICIAN: Mickey Farber, MD   REQUESTING/REFERRING PHYSICIAN: Derrill Kay, MD  CHIEF COMPLAINT:   Chief Complaint  Patient presents with  . Rectal Bleeding    HISTORY OF PRESENT ILLNESS:  Robert Ball  is a 70 y.o. male who presents with Acute right upper quadrant and epigastric pain with nausea and vomiting. Patient has a history of pancreatitis in the past, and on evaluation here in the ED was found to have an elevated lipase. He also states that he has had a number of black stools for the last few days. His hemoglobin in the ED today is stable, and within normal limits. Hospitalists were called for admission  PAST MEDICAL HISTORY:   Past Medical History:  Diagnosis Date  . Anxiety   . Cardiac defibrillator in place   . COPD (chronic obstructive pulmonary disease) (HCC)    On 2liter oxygen  . Depression   . Elevated lipids   . GERD (gastroesophageal reflux disease)   . Headache   . Hypertension   . Prostate enlargement   . PTSD (post-traumatic stress disorder)   . Shakes     PAST SURGICAL HISTORY:   Past Surgical History:  Procedure Laterality Date  . BACK SURGERY    . CATARACT EXTRACTION W/ INTRAOCULAR LENS  IMPLANT, BILATERAL Bilateral   . EYE MUSCLE SURGERY Bilateral   . HEMORRHOID SURGERY    . HERNIA REPAIR    . ICD LEAD REMOVAL Left 12/27/2014   Procedure: ICD LEAD REMOVAL/ICD GENERATOR CHANGE OUT;  Surgeon: Marcina Millard, MD;  Location: [facility] ORS;  Service: Cardiovascular;  Laterality: Left;  . NECK SURGERY      SOCIAL HISTORY:   Social History  Substance Use Topics  . Smoking status: Former Smoker    Packs/day: 5.00    Years: 40.00    Types: Cigarettes    Quit date: 09/24/2007  . Smokeless tobacco: Never Used  . Alcohol use 0.0 oz/week     Comment: daily, beer  and hard liquor    FAMILY HISTORY:   Family History  Problem Relation Age of Onset  . Heart disease Mother   . Heart disease Father   . Lymphoma Father     DRUG ALLERGIES:   Allergies  Allergen Reactions  . Contrast Media [Iodinated Diagnostic Agents] Anaphylaxis    Angioedema  . Aspirin Other (See Comments) and Itching    Pt is unable to take this medication.    . Ciprofloxacin Hcl Other (See Comments)    Reaction:  Unknown   . Ibuprofen Other (See Comments) and Itching    Pt is unable to take this medication.    . Metronidazole Other (See Comments)    Reaction:  Unknown   . Naproxen Other (See Comments)    Pt is unable to take this medication.    . Oxycodone Itching  . Tetracyclines & Related Other (See Comments)    Reaction:  Unknown   . Ciprofloxacin Rash  . Nitrofurantoin Rash  . Sulfa Antibiotics Rash  . Tetracycline Rash    MEDICATIONS AT HOME:   Prior to Admission medications   Medication Sig Start Date End Date Taking? Authorizing Provider  albuterol (PROVENTIL HFA;VENTOLIN HFA) 108 (90 BASE) MCG/ACT inhaler Inhale 2 puffs into the lungs 2 (two) times daily.    Yes  Historical Provider, MD  amLODipine (NORVASC) 10 MG tablet Take 10 mg by mouth daily.   Yes Historical Provider, MD  benztropine (COGENTIN) 0.5 MG tablet Take 0.5 mg by mouth daily.   Yes Historical Provider, MD  budesonide-formoterol (SYMBICORT) 160-4.5 MCG/ACT inhaler Inhale 2 puffs into the lungs 2 (two) times daily.    Yes Historical Provider, MD  carvedilol (COREG) 12.5 MG tablet Take 12.5 mg by mouth 2 (two) times daily.    Yes Historical Provider, MD  fenofibrate 160 MG tablet Take 160 mg by mouth daily.   Yes Historical Provider, MD  nystatin (MYCOSTATIN) 100000 UNIT/ML suspension Take 5 mLs by mouth 4 (four) times daily. Swish and swallow   Yes Historical Provider, MD  predniSONE (DELTASONE) 10 MG tablet Take 10 mg by mouth daily. 09/20/15  Yes Historical Provider, MD  prochlorperazine  (COMPAZINE) 5 MG tablet Take 5 mg by mouth 3 (three) times daily as needed for nausea or vomiting.    Yes Historical Provider, MD  RABEprazole (ACIPHEX) 20 MG tablet Take 20 mg by mouth daily.   Yes Historical Provider, MD  roflumilast (DALIRESP) 500 MCG TABS tablet Take 500 mcg by mouth daily.   Yes Historical Provider, MD  sertraline (ZOLOFT) 100 MG tablet Take 100 mg by mouth 2 (two) times daily. Reported on 07/17/2015   Yes Historical Provider, MD  tamsulosin (FLOMAX) 0.4 MG CAPS capsule Take 1 capsule (0.4 mg total) by mouth daily. Patient taking differently: Take 0.4 mg by mouth 2 (two) times daily.  07/09/15  Yes Crist FatBenjamin W Herrick, MD  tiotropium (SPIRIVA) 18 MCG inhalation capsule Place 18 mcg into inhaler and inhale daily.   Yes Historical Provider, MD  traMADol (ULTRAM) 50 MG tablet Take 1 tablet (50 mg total) by mouth every 6 (six) hours as needed. 07/17/15  Yes Yevette EdwardsJames G Adams, MD  ziprasidone (GEODON) 60 MG capsule Take 60 mg by mouth at bedtime.   Yes Historical Provider, MD  acetaminophen (TYLENOL) 500 MG tablet Take 500 mg by mouth every 6 (six) hours as needed for mild pain or headache.    Historical Provider, MD  albuterol (PROVENTIL) (2.5 MG/3ML) 0.083% nebulizer solution Take 2.5 mg by nebulization every 6 (six) hours as needed for wheezing or shortness of breath.    Historical Provider, MD    REVIEW OF SYSTEMS:  Review of Systems  Constitutional: Negative for chills, fever, malaise/fatigue and weight loss.  HENT: Negative for ear pain, hearing loss and tinnitus.   Eyes: Negative for blurred vision, double vision, pain and redness.  Respiratory: Negative for cough, hemoptysis and shortness of breath.   Cardiovascular: Negative for chest pain, palpitations, orthopnea and leg swelling.  Gastrointestinal: Positive for abdominal pain, melena, nausea and vomiting. Negative for constipation and diarrhea.  Genitourinary: Negative for dysuria, frequency and hematuria.  Musculoskeletal:  Negative for back pain, joint pain and neck pain.  Skin:       No acne, rash, or lesions  Neurological: Negative for dizziness, tremors, focal weakness and weakness.  Endo/Heme/Allergies: Negative for polydipsia. Does not bruise/bleed easily.  Psychiatric/Behavioral: Negative for depression. The patient is not nervous/anxious and does not have insomnia.      VITAL SIGNS:   Vitals:   12/30/15 1900 12/30/15 1930 12/30/15 2000 12/30/15 2030  BP: (!) 146/89 126/87 122/73 117/72  Pulse: (!) 106 (!) 102 92 83  Resp: 18 (!) 24 (!) 22 (!) 22  SpO2: 98% 99% 99% 99%  Weight:      Height:  Wt Readings from Last 3 Encounters:  12/30/15 68.9 kg (152 lb)  12/22/15 83.5 kg (184 lb)  12/09/15 83.6 kg (184 lb 4.8 oz)    PHYSICAL EXAMINATION:  Physical Exam  Vitals reviewed. Constitutional: He is oriented to person, place, and time. He appears well-developed and well-nourished. No distress.  HENT:  Head: Normocephalic and atraumatic.  Mouth/Throat: Oropharynx is clear and moist.  Eyes: Conjunctivae and EOM are normal. Pupils are equal, round, and reactive to light. No scleral icterus.  Neck: Normal range of motion. Neck supple. No JVD present. No thyromegaly present.  Cardiovascular: Normal rate, regular rhythm and intact distal pulses.  Exam reveals no gallop and no friction rub.   No murmur heard. Respiratory: Effort normal and breath sounds normal. No respiratory distress. He has no wheezes. He has no rales.  GI: Soft. Bowel sounds are normal. He exhibits no distension. There is tenderness (right upper quadrant and epigastric).  Musculoskeletal: Normal range of motion. He exhibits no edema.  No arthritis, no gout  Lymphadenopathy:    He has no cervical adenopathy.  Neurological: He is alert and oriented to person, place, and time. No cranial nerve deficit.  No dysarthria, no aphasia  Skin: Skin is warm and dry. No rash noted. No erythema.  Psychiatric: He has a normal mood and  affect. His behavior is normal. Judgment and thought content normal.    LABORATORY PANEL:   CBC  Recent Labs Lab 12/30/15 1915  WBC 9.0  HGB 13.0  HCT 38.2*  PLT 212   ------------------------------------------------------------------------------------------------------------------  Chemistries   Recent Labs Lab 12/30/15 1915  NA 132*  K 2.9*  CL 103  CO2 21*  GLUCOSE 122*  BUN 11  CREATININE 0.91  CALCIUM 8.9  AST 14*  ALT 7*  ALKPHOS 41  BILITOT 1.1   ------------------------------------------------------------------------------------------------------------------  Cardiac Enzymes No results for input(s): TROPONINI in the last 168 hours. ------------------------------------------------------------------------------------------------------------------  RADIOLOGY:  No results found.  EKG:   Orders placed or performed during the hospital encounter of 06/27/15  . EKG 12-Lead  . EKG 12-Lead  . ED EKG  . ED EKG    IMPRESSION AND PLAN:  Principal Problem:   Acute on chronic pancreatitis (HCC) - when necessary pain meds and antiemetics, nothing by mouth for now, IV fluids, GI consult for further recommendations for his pancreatitis, as well as potential recommendations for workup of his melena Active Problems:   Chronic systolic heart failure (HCC) - cautious with the IV fluids, continue home meds   COPD (chronic obstructive pulmonary disease) (HCC) - continue home inhalers   Benign essential HTN - currently stable, continue home meds   GERD (gastroesophageal reflux disease) - home dose PPI  All the records are reviewed and case discussed with ED provider. Management plans discussed with the patient and/or family.  DVT PROPHYLAXIS: SubQ lovenox  GI PROPHYLAXIS: PPI  ADMISSION STATUS: Inpatient  CODE STATUS: Full Code Status History    Date Active Date Inactive Code Status Order ID Comments User Context   12/05/2015  7:29 PM 12/11/2015  8:11 PM Full Code  [id number]  Gwendolyn Fill, NP ED   04/09/2015 10:30 PM 04/11/2015  6:23 PM Full Code [id number]  Houston Siren, MD Inpatient   12/27/2014  3:17 PM 12/27/2014  7:47 PM Full Code [id number]  Marcina Millard, MD Inpatient      TOTAL TIME TAKING CARE OF THIS PATIENT: 45 minutes.    Sherena Machorro FIELDING 12/30/2015, 8:59 PM  [employer] [employer]  Office  [phone]  CC: Primary care physician; Ezequiel Kayser, MD

## 2015-12-30 NOTE — ED Notes (Signed)
Report given to floor RN. Pt taken to floor via stretcher. Vital signs stable prior to transport.  

## 2015-12-30 NOTE — ED Provider Notes (Signed)
[facility] Emergency Department Provider Note    ____________________________________________   I have reviewed the triage vital signs and the nursing notes.   HISTORY  Chief Complaint Rectal Bleeding   History limited by: Not Limited   HPI Robert Ball is a 70 y.o. male who presents to the emergency department today because of concerns for abdominal pain and GI bleeding. The patient was discharged from the hospital earlier in the month after stay for abdominal pain and pancreatitis per the wife. Since that time he has been having intermittent episodes of severe epigastric abdominal pain. He also has not been eating very much. For the past week or so they have been noticing intermittent episodes of black and tarry stools as well as emesis consisting of black specks.He denies fevers.    Past Medical History:  Diagnosis Date  . Anxiety   . Cardiac defibrillator in place   . COPD (chronic obstructive pulmonary disease) (HCC)    On 2liter oxygen  . Depression   . Elevated lipids   . GERD (gastroesophageal reflux disease)   . Headache   . Hypertension   . Prostate enlargement   . PTSD (post-traumatic stress disorder)   . Shakes     Patient Active Problem List   Diagnosis Date Noted  . COPD exacerbation (HCC) 12/11/2015  . Chronic pancreatitis (HCC) 12/11/2015  . Diffuse abdominal pain 12/11/2015  . Alcoholic hepatitis 12/11/2015  . Clostridium difficile enterocolitis 12/11/2015  . Acute renal failure (HCC) 12/11/2015  . Generalized weakness 12/11/2015  . Acute respiratory failure with hypoxia (HCC) 12/11/2015  . Agitation 12/11/2015  . Anaphylactic shock 12/05/2015  . Nocturia 10/15/2015  . Arteriosclerosis of coronary artery 07/25/2015  . Benign prostatic hyperplasia with urinary obstruction 07/03/2015  . Chronic LBP 04/16/2015  . Abdominal pain 04/09/2015  . Clinical depression 01/31/2015  . Other long term (current) drug therapy  01/31/2015  . 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 Chest pain 01/02/2015  . Venous insufficiency of leg 01/02/2015  . Pulmonary hypertension (HCC) 03/20/2014  . Chronic systolic heart failure (HCC) 03/20/2014  . Allergic rhinitis 01/25/2014  . Back pain, chronic 01/25/2014  . Chronic diarrhea 01/25/2014  . Chronic kidney disease (CKD), stage III (moderate) 01/25/2014  . Benign essential HTN 01/25/2014  . Combined fat and carbohydrate induced hyperlipemia 01/25/2014  . Has a tremor 01/25/2014  . Oxygen desaturation 12/28/2013  . Subclinical hypothyroidism 12/28/2012  . Encounter for issue of repeat prescription 06/23/2012  . Acid reflux 12/17/2011  . Neurosis, posttraumatic 12/17/2011  . Persons encountering health services in other specified circumstances 12/17/2011  . Encounter for other specified special examinations 12/17/2011  . Colon polyp 12/10/2011  . Chronic obstructive pulmonary disease (HCC) 12/10/2011  . Gout 12/10/2011  . History of biliary T-tube placement 12/10/2011    Past Surgical History:  Procedure Laterality Date  . BACK SURGERY    . CATARACT EXTRACTION W/ INTRAOCULAR LENS  IMPLANT, BILATERAL Bilateral   . EYE MUSCLE SURGERY Bilateral   . HEMORRHOID SURGERY    . HERNIA REPAIR    . ICD LEAD REMOVAL Left 12/27/2014   Procedure: ICD LEAD REMOVAL/ICD GENERATOR CHANGE OUT;  Surgeon: Marcina MillardAlexander Paraschos, MD;  Location: [facility] ORS;  Service: Cardiovascular;  Laterality: Left;  . NECK SURGERY      Prior to Admission medications   Medication Sig Start Date End Date Taking? Authorizing Provider  acetaminophen (TYLENOL) 500 MG tablet Take 500 mg by mouth every 6 (six) hours as needed for  mild pain or headache.    Historical Provider, MD  albuterol (PROVENTIL HFA;VENTOLIN HFA) 108 (90 BASE) MCG/ACT inhaler Inhale 2 puffs into the lungs every 6 (six) hours as needed for wheezing or shortness of breath.    Historical Provider, MD  albuterol (PROVENTIL) (2.5 MG/3ML)  0.083% nebulizer solution Take 2.5 mg by nebulization every 6 (six) hours as needed for wheezing or shortness of breath.    Historical Provider, MD  amLODipine (NORVASC) 10 MG tablet Take 10 mg by mouth daily.    Historical Provider, MD  budesonide-formoterol (SYMBICORT) 160-4.5 MCG/ACT inhaler Inhale into the lungs.    Historical Provider, MD  carvedilol (COREG) 12.5 MG tablet Take 12.5 mg by mouth 2 (two) times daily.     Historical Provider, MD  cetirizine (ZYRTEC) 10 MG tablet Take by mouth. 07/26/14   Historical Provider, MD  Cholecalciferol (VITAMIN D3) 5000 UNITS CAPS Take 5,000 Units by mouth every other day. Reported on 10/31/2015    Historical Provider, MD  fenofibrate 160 MG tablet Take 160 mg by mouth daily.    Historical Provider, MD  furosemide (LASIX) 40 MG tablet Take 40 mg by mouth daily.    Historical Provider, MD  loperamide (IMODIUM) 2 MG capsule Take 4 mg by mouth daily.     Historical Provider, MD  omeprazole (PRILOSEC) 20 MG capsule Take 20 mg by mouth 2 (two) times daily.     Historical Provider, MD  predniSONE (DELTASONE) 10 MG tablet Take 10 mg by mouth daily. 09/20/15   Historical Provider, MD  prochlorperazine (COMPAZINE) 5 MG tablet Take 5 mg by mouth 3 (three) times daily as needed for nausea or vomiting.     Historical Provider, MD  roflumilast (DALIRESP) 500 MCG TABS tablet Take 500 mcg by mouth daily.    Historical Provider, MD  sertraline (ZOLOFT) 100 MG tablet Take 100 mg by mouth 2 (two) times daily. Reported on 07/17/2015    Historical Provider, MD  tamsulosin (FLOMAX) 0.4 MG CAPS capsule Take 1 capsule (0.4 mg total) by mouth daily. Patient taking differently: Take 0.4 mg by mouth 2 (two) times daily.  07/09/15   Crist Fat, MD  tiotropium (SPIRIVA) 18 MCG inhalation capsule Place 18 mcg into inhaler and inhale daily.    Historical Provider, MD  traMADol (ULTRAM) 50 MG tablet Take 1 tablet (50 mg total) by mouth every 6 (six) hours as needed. 07/17/15   Yevette Edwards, MD  ziprasidone (GEODON) 60 MG capsule Take 60 mg by mouth at bedtime.    Historical Provider, MD    Allergies Contrast media [iodinated diagnostic agents]; Aspirin; Ciprofloxacin hcl; Ibuprofen; Metronidazole; Naproxen; Oxycodone; Tetracyclines & related; Ciprofloxacin; Nitrofurantoin; Sulfa antibiotics; and Tetracycline  Family History  Problem Relation Age of Onset  . Heart disease Mother   . Heart disease Father   . Lymphoma Father     Social History Social History  Substance Use Topics  . Smoking status: Former Smoker    Packs/day: 5.00    Years: 40.00    Types: Cigarettes    Quit date: 09/24/2007  . Smokeless tobacco: Never Used  . Alcohol use 0.0 oz/week     Comment: daily, beer and hard liquor    Review of Systems  Constitutional: Negative for fever. Cardiovascular: Negative for chest pain. Respiratory: Negative for shortness of breath. Gastrointestinal: Positive for abdominal pain Neurological: Negative for headaches, focal weakness or numbness.  10-point ROS otherwise negative.  ____________________________________________   PHYSICAL EXAM:  VITAL SIGNS:  ED Triage Vitals  Enc Vitals Group     BP --      Pulse --      Resp --      Temp --      Temp src --      SpO2 12/30/15 1827 97 %     Weight 12/30/15 1830 152 lb (68.9 kg)     Height 12/30/15 1830 5\' 5"  (1.651 m)     Head Circumference --      Peak Flow --      Pain Score 12/30/15 1842 8   Constitutional: Alert and oriented. Appears uncomfortable Eyes: Conjunctivae are normal. PERRL. Normal extraocular movements. ENT   Head: Normocephalic and atraumatic.   Nose: No congestion/rhinnorhea.   Mouth/Throat: Mucous membranes are moist.   Neck: No stridor. Hematological/Lymphatic/Immunilogical: No cervical lymphadenopathy. Cardiovascular: Normal rate, regular rhythm.  No murmurs, rubs, or gallops. Respiratory: Normal respiratory effort without tachypnea nor retractions. Breath  sounds are clear and equal bilaterally. No wheezes/rales/rhonchi. Gastrointestinal: Soft. Tender to palpation primarily in the epigastric region. No distention. There is no CVA tenderness. Genitourinary: Deferred Musculoskeletal: Normal range of motion in all extremities. No joint effusions.  No lower extremity tenderness nor edema. Neurologic:  Normal speech and language. No gross focal neurologic deficits are appreciated.  Skin:  Skin is warm, dry and intact. No rash noted. Psychiatric: Mood and affect are normal. Speech and behavior are normal. Patient exhibits appropriate insight and judgment.  ____________________________________________    LABS (pertinent positives/negatives)  Labs Reviewed  CBC WITH DIFFERENTIAL/PLATELET - Abnormal; Notable for the following:       Result Value   HCT 38.2 (*)    Neutro Abs 7.7 (*)    Lymphs Abs 0.5 (*)    All other components within normal limits  PROTIME-INR - Abnormal; Notable for the following:    Prothrombin Time 15.4 (*)    All other components within normal limits  LIPASE, BLOOD - Abnormal; Notable for the following:    Lipase 104 (*)    All other components within normal limits  COMPREHENSIVE METABOLIC PANEL - Abnormal; Notable for the following:    Sodium 132 (*)    Potassium 2.9 (*)    CO2 21 (*)    Glucose, Bld 122 (*)    Total Protein 6.3 (*)    Albumin 3.3 (*)    AST 14 (*)    ALT 7 (*)    All other components within normal limits  URINALYSIS COMPLETEWITH MICROSCOPIC ([facility] ONLY)     ____________________________________________   EKG  I, Phineas Semen, attending physician, personally viewed and interpreted this EKG  EKG Time: 1828 Rate: 113 Rhythm: atrial sensed ventricular paced rhythm Axis: left axis deviation Intervals: qtc 513 QRS: narrow ST changes: no st elevation Impression: abnormal ekg   ____________________________________________     RADIOLOGY  None  ____________________________________________   PROCEDURES  Procedures  ____________________________________________   INITIAL IMPRESSION / ASSESSMENT AND PLAN / ED COURSE  Pertinent labs & imaging results that were available during my care of the patient were reviewed by me and considered in my medical decision making (see chart for details).  Patient presents to the emergency department today because of concerns for abdominal pain, GI bleed.  Clinical Course   Patient's blood work is concerning for an elevated lipase. This is likely contributed to the patient's pain. Will plan on admission for further workup and management of the pancreatitis. His blood work also showed hypokalemia. This likely secondary to  poor by mouth intake. Will plan on repeating. ____________________________________________   FINAL CLINICAL IMPRESSION(S) / ED DIAGNOSES  Final diagnoses:  Acute pancreatitis, unspecified pancreatitis type  Abdominal pain, unspecified abdominal location  Hypokalemia     Note: This dictation was prepared with Dragon dictation. Any transcriptional errors that result from this process are unintentional    Phineas Semen, MD 12/30/15 2049

## 2015-12-30 NOTE — ED Triage Notes (Signed)
t arrived to ed via ems with complaints of of abdominal pain and rectal bleeding.

## 2015-12-31 DIAGNOSIS — E43 Unspecified severe protein-calorie malnutrition: Secondary | ICD-10-CM | POA: Insufficient documentation

## 2015-12-31 LAB — URINALYSIS COMPLETE WITH MICROSCOPIC (ARMC ONLY)
BACTERIA UA: NONE SEEN
BILIRUBIN URINE: NEGATIVE
GLUCOSE, UA: NEGATIVE mg/dL
Hgb urine dipstick: NEGATIVE
Ketones, ur: NEGATIVE mg/dL
Nitrite: NEGATIVE
PH: 6 (ref 5.0–8.0)
Protein, ur: NEGATIVE mg/dL
Specific Gravity, Urine: 1.01 (ref 1.005–1.030)

## 2015-12-31 LAB — BASIC METABOLIC PANEL
Anion gap: 5 (ref 5–15)
BUN: 11 mg/dL (ref 6–20)
CHLORIDE: 104 mmol/L (ref 101–111)
CO2: 25 mmol/L (ref 22–32)
CREATININE: 1.08 mg/dL (ref 0.61–1.24)
Calcium: 8.6 mg/dL — ABNORMAL LOW (ref 8.9–10.3)
GFR calc Af Amer: >60 mL/min (ref >60)
GFR calc non Af Amer: >60 mL/min (ref >60)
GLUCOSE: 109 mg/dL — AB (ref 65–99)
Potassium: 3.2 mmol/L — ABNORMAL LOW (ref 3.5–5.1)
SODIUM: 134 mmol/L — AB (ref 135–145)

## 2015-12-31 LAB — CBC
HEMATOCRIT: 33.3 % — AB (ref 40.0–52.0)
Hemoglobin: 11.6 g/dL — ABNORMAL LOW (ref 13.0–18.0)
MCH: 30.3 pg (ref 26.0–34.0)
MCHC: 34.8 g/dL (ref 32.0–36.0)
MCV: 87.2 fL (ref 80.0–100.0)
PLATELETS: 173 10*3/uL (ref 150–440)
RBC: 3.82 MIL/uL — ABNORMAL LOW (ref 4.40–5.90)
RDW: 14.2 % (ref 11.5–14.5)
WBC: 6.6 10*3/uL (ref 3.8–10.6)

## 2015-12-31 LAB — LIPASE, BLOOD: Lipase: 89 U/L — ABNORMAL HIGH (ref 11–51)

## 2015-12-31 MED ORDER — TIOTROPIUM BROMIDE MONOHYDRATE 18 MCG IN CAPS
18.0000 ug | ORAL_CAPSULE | Freq: Every day | RESPIRATORY_TRACT | Status: DC
  Administered 2015-12-31 – 2016-01-02 (×3): 18 ug via RESPIRATORY_TRACT
  Filled 2015-12-31: qty 5

## 2015-12-31 MED ORDER — ENOXAPARIN SODIUM 40 MG/0.4ML ~~LOC~~ SOLN
40.0000 mg | Freq: Every day | SUBCUTANEOUS | Status: DC
  Administered 2015-12-31 – 2016-01-09 (×11): 40 mg via SUBCUTANEOUS
  Filled 2015-12-31 (×11): qty 0.4

## 2015-12-31 MED ORDER — POTASSIUM CHLORIDE 10 MEQ/100ML IV SOLN
10.0000 meq | INTRAVENOUS | Status: AC
  Administered 2015-12-31 (×4): 10 meq via INTRAVENOUS
  Filled 2015-12-31 (×4): qty 100

## 2015-12-31 MED ORDER — SODIUM CHLORIDE 0.9 % IV SOLN
INTRAVENOUS | Status: DC
  Administered 2015-12-31 – 2016-01-02 (×5): via INTRAVENOUS

## 2015-12-31 MED ORDER — SERTRALINE HCL 50 MG PO TABS
100.0000 mg | ORAL_TABLET | Freq: Two times a day (BID) | ORAL | Status: DC
  Administered 2015-12-31 – 2016-01-09 (×19): 100 mg via ORAL
  Filled 2015-12-31 (×22): qty 2

## 2015-12-31 MED ORDER — CARVEDILOL 12.5 MG PO TABS
12.5000 mg | ORAL_TABLET | Freq: Two times a day (BID) | ORAL | Status: DC
  Administered 2015-12-31 – 2016-01-10 (×18): 12.5 mg via ORAL
  Filled 2015-12-31 (×19): qty 1

## 2015-12-31 MED ORDER — ROFLUMILAST 500 MCG PO TABS
500.0000 ug | ORAL_TABLET | Freq: Every day | ORAL | Status: DC
  Administered 2015-12-31 – 2016-01-08 (×9): 500 ug via ORAL
  Filled 2015-12-31 (×9): qty 1

## 2015-12-31 MED ORDER — ONDANSETRON HCL 4 MG/2ML IJ SOLN
4.0000 mg | Freq: Four times a day (QID) | INTRAMUSCULAR | Status: DC | PRN
  Administered 2016-01-02 – 2016-01-08 (×5): 4 mg via INTRAVENOUS
  Filled 2015-12-31 (×5): qty 2

## 2015-12-31 MED ORDER — ACETAMINOPHEN 650 MG RE SUPP: 650.0000 mg | Freq: Four times a day (QID) | RECTAL | Status: DC | PRN

## 2015-12-31 MED ORDER — PANTOPRAZOLE SODIUM 40 MG PO TBEC
40.0000 mg | DELAYED_RELEASE_TABLET | Freq: Every day | ORAL | Status: DC
  Administered 2015-12-31 – 2016-01-10 (×10): 40 mg via ORAL
  Filled 2015-12-31 (×11): qty 1

## 2015-12-31 MED ORDER — ACETAMINOPHEN 325 MG PO TABS
650.0000 mg | ORAL_TABLET | Freq: Four times a day (QID) | ORAL | Status: DC | PRN
  Administered 2016-01-07: 06:00:00 650 mg via ORAL
  Filled 2015-12-31: qty 2

## 2015-12-31 MED ORDER — TAMSULOSIN HCL 0.4 MG PO CAPS
0.4000 mg | ORAL_CAPSULE | Freq: Every day | ORAL | Status: DC
  Administered 2015-12-31 – 2016-01-08 (×9): 0.4 mg via ORAL
  Filled 2015-12-31 (×12): qty 1

## 2015-12-31 MED ORDER — AMLODIPINE BESYLATE 10 MG PO TABS
10.0000 mg | ORAL_TABLET | Freq: Every day | ORAL | Status: DC
  Administered 2015-12-31 – 2016-01-10 (×10): 10 mg via ORAL
  Filled 2015-12-31 (×11): qty 1

## 2015-12-31 MED ORDER — ZIPRASIDONE HCL 20 MG PO CAPS
60.0000 mg | ORAL_CAPSULE | Freq: Every day | ORAL | Status: DC
  Administered 2015-12-31 – 2016-01-06 (×8): 60 mg via ORAL
  Administered 2016-01-07: 21:00:00 20 mg via ORAL
  Filled 2015-12-31 (×2): qty 3
  Filled 2015-12-31: qty 1
  Filled 2015-12-31 (×4): qty 3
  Filled 2015-12-31: qty 1
  Filled 2015-12-31: qty 3

## 2015-12-31 MED ORDER — ONDANSETRON HCL 4 MG PO TABS: 4.0000 mg | ORAL_TABLET | Freq: Four times a day (QID) | ORAL | Status: DC | PRN

## 2015-12-31 MED ORDER — FAMOTIDINE IN NACL 20-0.9 MG/50ML-% IV SOLN
20.0000 mg | Freq: Two times a day (BID) | INTRAVENOUS | Status: DC
  Administered 2015-12-31 – 2016-01-06 (×13): 20 mg via INTRAVENOUS
  Filled 2015-12-31 (×15): qty 50

## 2015-12-31 MED ORDER — SODIUM CHLORIDE 0.9 % IV SOLN
INTRAVENOUS | Status: AC
  Administered 2015-12-31: 01:00:00 via INTRAVENOUS

## 2015-12-31 MED ORDER — MOMETASONE FURO-FORMOTEROL FUM 200-5 MCG/ACT IN AERO
2.0000 | INHALATION_SPRAY | Freq: Two times a day (BID) | RESPIRATORY_TRACT | Status: DC
  Administered 2015-12-31 – 2016-01-09 (×20): 2 via RESPIRATORY_TRACT
  Filled 2015-12-31: qty 8.8

## 2015-12-31 MED ORDER — POTASSIUM CHLORIDE 10 MEQ/100ML IV SOLN
10.0000 meq | Freq: Once | INTRAVENOUS | Status: DC
  Filled 2015-12-31 (×2): qty 100

## 2015-12-31 MED ORDER — MORPHINE SULFATE (PF) 4 MG/ML IV SOLN
4.0000 mg | INTRAVENOUS | Status: DC | PRN
  Administered 2015-12-31 – 2016-01-02 (×7): 4 mg via INTRAVENOUS
  Filled 2015-12-31 (×7): qty 1

## 2015-12-31 NOTE — Progress Notes (Signed)
Morphine given for severe epigastric pain.  Shift uneventful.

## 2015-12-31 NOTE — Progress Notes (Signed)
[facility] [place] [facility]   PATIENT NAME: Robert Ball    MR#:  [id number]  DATE OF BIRTH:  17-Feb-1946  SUBJECTIVE:  CHIEF COMPLAINT:   Chief Complaint  Patient presents with  . Rectal Bleeding   - acute on chronic pancreatitis, still with worse mid abdominal pain and nausea - remains NPO, lipase improving  REVIEW OF SYSTEMS:  Review of Systems  Constitutional: Positive for malaise/fatigue. Negative for chills and fever.  HENT: Negative for ear discharge, ear pain and nosebleeds.   Eyes: Negative for blurred vision and photophobia.  Respiratory: Negative for cough, shortness of breath and wheezing.   Cardiovascular: Negative for chest pain, palpitations and leg swelling.  Gastrointestinal: Positive for abdominal pain and nausea. Negative for constipation, diarrhea and vomiting.  Genitourinary: Negative for dysuria.  Musculoskeletal: Negative for myalgias.  Neurological: Negative for dizziness, speech change, focal weakness, seizures and headaches.    DRUG ALLERGIES:   Allergies  Allergen Reactions  . Contrast Media [Iodinated Diagnostic Agents] Anaphylaxis    Angioedema  . Aspirin Other (See Comments) and Itching    Pt is unable to take this medication.    . Ciprofloxacin Hcl Other (See Comments)    Reaction:  Unknown   . Ibuprofen Other (See Comments) and Itching    Pt is unable to take this medication.    . Metronidazole Other (See Comments)    Reaction:  Unknown   . Naproxen Other (See Comments)    Pt is unable to take this medication.    . Oxycodone Itching  . Tetracyclines & Related Other (See Comments)    Reaction:  Unknown   . Ciprofloxacin Rash  . Nitrofurantoin Rash  . Sulfa Antibiotics Rash  . Tetracycline Rash    VITALS:  Blood pressure 136/61, pulse 72, temperature 97.3 F (36.3 C), temperature source Oral, resp. rate 20, height 5\' 5"  (1.651 m), weight 68.9 kg (152 lb), SpO2 100 %.  PHYSICAL EXAMINATION:  Physical  Exam  GENERAL:  70 y.o.-year-old patient lying in the bed with no acute distress.  EYES: Pupils equal, round, reactive to light and accommodation. No scleral icterus. Extraocular muscles intact.  HEENT: Head atraumatic, normocephalic. Oropharynx and nasopharynx clear.  NECK:  Supple, no jugular venous distention. No thyroid enlargement, no tenderness.  LUNGS: Normal breath sounds bilaterally, no wheezing, rales,rhonchi or crepitation. No use of accessory muscles of respiration. Decreased bibasilar breath sounds CARDIOVASCULAR: S1, S2 normal. No murmurs, rubs, or gallops.  ABDOMEN: abdominal tenderness with voluntary guarding, no rigidity, nondistended. Bowel sounds present. No organomegaly or mass.  EXTREMITIES: No pedal edema, cyanosis, or clubbing.  NEUROLOGIC: Cranial nerves II through XII are intact. Muscle strength 5/5 in all extremities. Sensation intact. Gait not checked.  PSYCHIATRIC: The patient is alert and oriented x 3.  SKIN: No obvious rash, lesion, or ulcer.    LABORATORY PANEL:   CBC  Recent Labs Lab 12/31/15 0328  WBC 6.6  HGB 11.6*  HCT 33.3*  PLT 173   ------------------------------------------------------------------------------------------------------------------  Chemistries   Recent Labs Lab 12/30/15 1915 12/31/15 0328  NA 132* 134*  K 2.9* 3.2*  CL 103 104  CO2 21* 25  GLUCOSE 122* 109*  BUN 11 11  CREATININE 0.91 1.08  CALCIUM 8.9 8.6*  AST 14*  --   ALT 7*  --   ALKPHOS 41  --   BILITOT 1.1  --    ------------------------------------------------------------------------------------------------------------------  Cardiac Enzymes No results for input(s): TROPONINI in the last 168  hours. ------------------------------------------------------------------------------------------------------------------  RADIOLOGY:  No results found.  EKG:   Orders placed or performed during the hospital encounter of 06/27/15  . EKG 12-Lead  . EKG 12-Lead   . ED EKG  . ED EKG    ASSESSMENT AND PLAN:   70 year old male with past medical history significant for COPD on home oxygen, CAD, cardiac defibrillator in place, hyperlipidemia presents to the hospital secondary to worsening abdominal pain and nausea.  #1 acute on chronic pancreatitis-noted to have acute pancreatitis during his last admission 3 weeks ago. Symptoms have never resolved after discharge. -Still with abdominal pain. Lipase was elevated on admission. Continue nothing by mouth -IV fluids and monitor lipase. -CT of the abdomen from 3 weeks ago showing several coarse calcification on the pancreas indicating possible chronic pancreatitis. -No gallstones noted at the time. -Treated for idiopathic pancreatitis  #2 hypokalemia-being replaced. Monitor  #3 hypertension-continue on Norvasc, Coreg.  #4 COPD-continue oxygen. Continue inhalers. No active wheezing and no indication for systemic steroids at this time  #5 depression-continue home medications.  #6 DVT prophylaxis-on Lovenox   All the records are reviewed and case discussed with Care Management/Social Workerr. Management plans discussed with the patient, family and they are in agreement.  CODE STATUS: Full Code  TOTAL TIME TAKING CARE OF THIS PATIENT: 37 minutes.   POSSIBLE D/C IN 1-2 DAYS, DEPENDING ON CLINICAL CONDITION.   Naeem Quillin M.D on 12/31/2015 at 2:35 PM  Between 7am to 6pm - Pager - [phone]  After 6pm go to [email or web address] - password [email or web address]  [facility]  Office  [phone]  CC: Primary care physician; Mickey FarberHIES, DAVID, MD

## 2015-12-31 NOTE — Progress Notes (Signed)
Initial Nutrition Assessment  DOCUMENTATION CODES:   Severe malnutrition in context of acute illness/injury  INTERVENTION:  -Monitor diet progression  -Recommend glucerna TID once able to tolerate po intake for additional kcals and protein.  Has been drinking at home prior to admission   NUTRITION DIAGNOSIS:   Malnutrition related to acute illness as evidenced by percent weight loss, energy intake < or equal to 50% for > or equal to 5 days.  ongoing  GOAL:   Patient will meet greater than or equal to 90% of their needs  Not meeting needs at this time  MONITOR:   Diet advancement, Weight trends, Labs  REASON FOR ASSESSMENT:   Malnutrition Screening Tool    ASSESSMENT:     Pt admitted with rectal bleeding, nausea, vomiting, abdominal pain, elevated lipase, acute pancreatitis  Past Medical History:  Diagnosis Date  . Anxiety   . Cardiac defibrillator in place   . COPD (chronic obstructive pulmonary disease) (HCC)    On 2liter oxygen  . Depression   . Elevated lipids   . GERD (gastroesophageal reflux disease)   . Headache   . Hypertension   . Prostate enlargement   . PTSD (post-traumatic stress disorder)   . Shakes    Wife reports pt with poor po intake since mid June only eating bites secondary to abdominal pain, nausea, vomiting, poor appetite  Medications reviewed: protonix, KCL, NS at 19825ml/hr Labs reviewed: NA 134, K 3.2, glucose 109, lipase 89  Nutrition-Focused physical exam completed. Findings are no fat depletion, mild in thigh, patellar, thigh area muscle depletion, and no edema.  Wife reports pt has been mostly in the bed, moving to bedside commode for the last 2-3 weeks  Diet Order:  Diet NPO time specified Except for: Sips with Meds  Skin:  Reviewed, no issues  Last BM:  8/27  Height:   Ht Readings from Last 1 Encounters:  12/30/15 5\' 5"  (1.651 m)    Weight: Wife reports wt loss of 20 pounds in the last month (12% wt loss in the last  month which is significant  Wt Readings from Last 1 Encounters:  12/30/15 152 lb (68.9 kg)    Ideal Body Weight:     BMI:  Body mass index is 25.29 kg/m.  Estimated Nutritional Needs:   Kcal:  2040-2380 kcals/d  Protein:  82-102 g/d  Fluid:  >/=2 L  EDUCATION NEEDS:   No education needs identified at this time  Briscoe Daniello B. [name], RD, LDN [phone] (pager) Weekend/On-Call pager [phone])

## 2015-12-31 NOTE — Care Management Important Message (Signed)
Important Message  Patient Details  Name: Reece AgarBilly R Radi MRN: [id number] Date of Birth: May 04, 1946   Medicare Important Message Given:  Yes    Gwenette GreetBrenda S Sonyia Muro, RN 12/31/2015, 9:01 AM

## 2015-12-31 NOTE — Care Management (Signed)
Admitted to [facility] with the diagnosis of acute on chronic pancreatitis. Lives with wife, Robert Ball. [phone]). Seen Dr. Harrington Challengerhies Thursday of last week. Hospice in the past. "They came one time." Home Health offered last discharge date of 12/11/15. No skilled facility. Home oxygen through [facility] for 9-10 years. 3 liters continuous. Prescriptions are filled at [facility]. Self feeds, self dresses, needs help with baths. Drives, but hasn't driven in awhile. Decreased appetite x 6 weeks. Weight loss of 20 lbs. Wife wil transport. Gwenette GreetBrenda S Shriyans Kuenzi RN MSN CCM Care Management [phone]

## 2016-01-01 LAB — COMPREHENSIVE METABOLIC PANEL
ALBUMIN: 2.8 g/dL — AB (ref 3.5–5.0)
ALT: 5 U/L — AB (ref 17–63)
ANION GAP: 9 (ref 5–15)
AST: 10 U/L — ABNORMAL LOW (ref 15–41)
Alkaline Phosphatase: 33 U/L — ABNORMAL LOW (ref 38–126)
BUN: 9 mg/dL (ref 6–20)
CHLORIDE: 108 mmol/L (ref 101–111)
CO2: 18 mmol/L — AB (ref 22–32)
Calcium: 8.4 mg/dL — ABNORMAL LOW (ref 8.9–10.3)
Creatinine, Ser: 0.94 mg/dL (ref 0.61–1.24)
GFR calc non Af Amer: >60 mL/min (ref >60)
Glucose, Bld: 81 mg/dL (ref 65–99)
POTASSIUM: 3.5 mmol/L (ref 3.5–5.1)
Sodium: 135 mmol/L (ref 135–145)
Total Bilirubin: 0.6 mg/dL (ref 0.3–1.2)
Total Protein: 5.7 g/dL — ABNORMAL LOW (ref 6.5–8.1)

## 2016-01-01 LAB — LIPASE, BLOOD: Lipase: 88 U/L — ABNORMAL HIGH (ref 11–51)

## 2016-01-01 LAB — AMMONIA: AMMONIA: 9 umol/L (ref 9–35)

## 2016-01-01 MED ORDER — HYDROMORPHONE HCL 1 MG/ML IJ SOLN
2.0000 mg | Freq: Once | INTRAMUSCULAR | Status: AC
  Administered 2016-01-02: 2 mg via INTRAVENOUS
  Filled 2016-01-01: qty 2

## 2016-01-01 MED ORDER — HALOPERIDOL LACTATE 5 MG/ML IJ SOLN
1.0000 mg | Freq: Once | INTRAMUSCULAR | Status: AC
  Administered 2016-01-01: 02:00:00 1 mg via INTRAVENOUS
  Filled 2016-01-01: qty 1

## 2016-01-01 NOTE — Progress Notes (Signed)
Attending MD (Gouru) made aware of positive C-diff result at PCP office on 12/27/15. Result faxed from [facility] and placed in patients paper chart. Bo McclintockBrewer,Eisen Robenson S, RN

## 2016-01-01 NOTE — Consult Note (Signed)
Please see full GI consult by Ms Kathryne HitchLondon. . Patient with chronic upper abdominal pain since last December 2016.  CT done 8/3 17  Showing likely chronic calcific pancreatitis and severe steatohepatitis.  Prior h/o etoh abuse, none for one month.  Patient with some AMS noted by his wife as well.  Currently oriented  Times 2/3.   Reported black stools, however also taking peptobismol at home and is heme negative on DRE.  He is a poor candidate for sedated luminal evaluation for further evaluation of the epigastric pain, however  UGIS may be of help.  Will also check for h pylori.  Continue PPI.   Will check ammonia.  Following.

## 2016-01-01 NOTE — Progress Notes (Signed)
Pt has gotten increasingly agitated and confused, with some hallucinations periodically, during this pm shift.  Dr. Sheryle Hailiamond ordered an ABG and Haldol.

## 2016-01-01 NOTE — Progress Notes (Signed)
[facility] [place] [facility]   PATIENT NAME: Robert ChandlerBilly Ball    MR#:  [id number]  DATE OF BIRTH:  1945/05/21  SUBJECTIVE:  CHIEF COMPLAINT:  Patient is reporting epigastric abdominal pain radiating to the back area denies any diarrhea in fact he is reporting constipation for the past 3 days. positive C-diff result at PCP office on 12/27/15.  REVIEW OF SYSTEMS:  CONSTITUTIONAL: No fever, fatigue or weakness.  EYES: No blurred or double vision.  EARS, NOSE, AND THROAT: No tinnitus or ear pain.  RESPIRATORY: No cough, shortness of breath, wheezing or hemoptysis.  CARDIOVASCULAR: No chest pain, orthopnea, edema.  GASTROINTESTINAL: No nausea, vomiting, diarrhea .Reporting epigastric abdominal pain.  GENITOURINARY: No dysuria, hematuria.  ENDOCRINE: No polyuria, nocturia,  HEMATOLOGY: No anemia, easy bruising or bleeding SKIN: No rash or lesion. MUSCULOSKELETAL: No joint pain or arthritis.   NEUROLOGIC: No tingling, numbness, weakness.  PSYCHIATRY: No anxiety or depression.   DRUG ALLERGIES:   Allergies  Allergen Reactions  . Contrast Media [Iodinated Diagnostic Agents] Anaphylaxis    Angioedema  . Aspirin Other (See Comments) and Itching    Pt is unable to take this medication.    . Ciprofloxacin Hcl Other (See Comments)    Reaction:  Unknown   . Ibuprofen Other (See Comments) and Itching    Pt is unable to take this medication.    . Metronidazole Other (See Comments)    Reaction:  Unknown   . Naproxen Other (See Comments)    Pt is unable to take this medication.    . Oxycodone Itching  . Tetracyclines & Related Other (See Comments)    Reaction:  Unknown   . Ciprofloxacin Rash  . Nitrofurantoin Rash  . Sulfa Antibiotics Rash  . Tetracycline Rash    VITALS:  Blood pressure (!) 125/54, pulse 82, temperature 99.1 F (37.3 C), temperature source Oral, resp. rate 18, height 5\' 5"  (1.651 m), weight 68.9 kg (152 lb), SpO2 95 %.  PHYSICAL  EXAMINATION:  GENERAL:  70 y.o.-year-old patient lying in the bed with no acute distress.  EYES: Pupils equal, round, reactive to light and accommodation. No scleral icterus. Extraocular muscles intact.  HEENT: Head atraumatic, normocephalic. Oropharynx and nasopharynx clear.  NECK:  Supple, no jugular venous distention. No thyroid enlargement, no tenderness.  LUNGS: Normal breath sounds bilaterally, no wheezing, rales,rhonchi or crepitation. No use of accessory muscles of respiration.  CARDIOVASCULAR: S1, S2 normal. No murmurs, rubs, or gallops.  ABDOMEN: Soft, epigastric tenderness, no rebound tenderness nondistended. Bowel sounds present. No organomegaly or mass.  EXTREMITIES: No pedal edema, cyanosis, or clubbing.  NEUROLOGIC: Cranial nerves II through XII are intact. Muscle strength 5/5 in all extremities. Sensation intact. Gait not checked.  PSYCHIATRIC: The patient is alert and oriented x 3.  SKIN: No obvious rash, lesion, or ulcer.    LABORATORY PANEL:   CBC  Recent Labs Ball 12/31/15 0328  WBC 6.6  HGB 11.6*  HCT 33.3*  PLT 173   ------------------------------------------------------------------------------------------------------------------  Chemistries   Recent Labs Ball 01/01/16 0530  NA 135  K 3.5  CL 108  CO2 18*  GLUCOSE 81  BUN 9  CREATININE 0.94  CALCIUM 8.4*  AST 10*  ALT 5*  ALKPHOS 33*  BILITOT 0.6   ------------------------------------------------------------------------------------------------------------------  Cardiac Enzymes No results for input(s): TROPONINI in the last 168 hours. ------------------------------------------------------------------------------------------------------------------  RADIOLOGY:  No results found.  EKG:   Orders placed or performed during the hospital encounter of 06/27/15  .  EKG 12-Lead  . EKG 12-Lead  . ED EKG  . ED EKG    ASSESSMENT AND PLAN:   70 year old male with past medical history  significant for COPD on home oxygen, CAD, cardiac defibrillator in place, hyperlipidemia presents to the hospital secondary to worsening abdominal pain and nausea.  #1 acute on chronic pancreatitis-noted to have acute pancreatitis during his last admission 3 weeks ago. Symptoms have never resolved after discharge. -Still with Epigastric abdominal pain Lipase is still elevated-104-89-88.  Continue nothing by mouth -IV fluids and monitor lipase. -CT of the abdomen from 3 weeks ago showing several coarse calcification on the pancreas indicating possible chronic pancreatitis. -No gallstones noted at the time.Treated for idiopathic pancreatitis  #2 hypokalemia-being replaced. Monitor. Potassium is 3.5 today  #3 hypertension-continue on Norvasc, Coreg.  #4 COPD-continue oxygen. Continue inhalers. No active wheezing and no indication for systemic steroids at this time  #5 depression-continue home medications.  #6 stool test is positive for C. difficile toxin but patient does not have diarrhea Patient just finished 14 days of vancomycin and denying any diarrhea Not considering antibiotics at this time. Discussed with gastroenterology agrees with the current plan   #6 DVT prophylaxis-on Lovenox      All the records are reviewed and case discussed with Care Management/Social Workerr. Management plans discussed with the patient, family and they are in agreement.  CODE STATUS: fc   TOTAL TIME TAKING CARE OF THIS PATIENT: 36  minutes.   POSSIBLE D/C IN 2-3 DAYS, DEPENDING ON CLINICAL CONDITION.  Note: This dictation was prepared with Dragon dictation along with smaller phrase technology. Any transcriptional errors that result from this process are unintentional.   Robert Ball M.D on 01/01/2016 at 2:31 PM  Between 7am to 6pm - Pager - [phone] After 6pm go to [email or web address] - password [email or web address] [facility]  [facility]  Office  [phone]  CC: Primary care  physician; Mickey Farber, MD

## 2016-01-01 NOTE — Progress Notes (Signed)
Pt. C/o severe abdominal pain radiating into back. Dilaudid 2mg  x1 ordered

## 2016-01-01 NOTE — Consult Note (Signed)
Consultation  Referring Provider:     Dr Nemiah Commander Admit date: 12/30/15 Consult date: 01/01/16         Reason for Consultation:  Acute on chronic pancreatitis/melena         HPI:   Robert Ball is a 70 y.o. male  COPD, CAD, cardiac defibrillator, cirrhoisis, chronic EtOH pancreatitis, HLD, and GERD hyperlipidemia, GERD, depression, and  chronic diarrhea. Was admitted 12/05/15 for anaphylaxis/respiratory distress and cdiff & treated with antibiotics/probiotics. Was evaluated by Gi during that visit.   Now admitted with acute ruq/epigastric pain, NV, reported melena. HGB was normal. Lipase elevated some at 104- thought to have acute on chronic pancreatitis. On admission K+ low at 2.9, Na 132, GFR/BUN/Cr normal. Lfts unremarkable. CBC unremarkable. Pt 15.4, INR 1.2.  Hgb now 11.6, this is within his normal range for the last month (11.3-13.0). K+ better. Had a + c diff test 8/25  Patient is not a great historian, his wife helps. They report he has had upper abdominal pain across both sides of his abdomen since last December.  Eating any food aggravates it. Unable to identify any alleviating factors. Denies NSAIDs. Does take the rabeprazole, however states none of the meds over the last several months have helped him feel better. His pain is associated with nausea and some occasional vomiting. Has had chronic diarrhea. States he has had no etoh in 6w. They report his pain increased over the weekend- wife gave him some Peptobismol on SUnday and Monday to help- Afterwards on Monday he vomited some black substance and had 2 black formed/sticky stools. No bm since. No further vomiting. Called PCP and was advised to come for treatment.  CT abdomen/pelvis with contrast 12/05/15 with severe hepatic steatosis, coarse calfications in the pancreas consistent with chronic pancreatitis but no inflammation or ductal problems. Splenomegaly. There were no acute findings. There were some changes to the bladder wall and GU  consult was recommended- he follows with [facility] urology and has an appt soon. [place] 7/17 with splenomegaly and possible cirrhosis. Additionally, pancreatic elastase was WNL 12/02/15, and quantitative fecal fat normal 12/06/15. Hemoccult cards were negative at that time. Is on aciphex 20mg  po qd for Gerd.  PREVIOUS ENDOSCOPIES:            EGD 2015- Dr Bluford Kaufmann, with esophagitis. Biopsy negative for Barrett's esophagus. At that time they declined colonoscopy due to COPD. Colonoscopy 2012 with a single adenoma      Past Medical History:  Diagnosis Date  . Anxiety   . Cardiac defibrillator in place   . COPD (chronic obstructive pulmonary disease) (HCC)    On 2liter oxygen  . Depression   . Elevated lipids   . GERD (gastroesophageal reflux disease)   . Headache   . Hypertension   . Prostate enlargement   . PTSD (post-traumatic stress disorder)   . Shakes   Cardiomyopathy  Past Surgical History:  Procedure Laterality Date  . BACK SURGERY    . CATARACT EXTRACTION W/ INTRAOCULAR LENS  IMPLANT, BILATERAL Bilateral   . EYE MUSCLE SURGERY Bilateral   . HEMORRHOID SURGERY    . HERNIA REPAIR    . ICD LEAD REMOVAL Left 12/27/2014   Procedure: ICD LEAD REMOVAL/ICD GENERATOR CHANGE OUT;  Surgeon: Marcina Millard, MD;  Location: [facility] ORS;  Service: Cardiovascular;  Laterality: Left;  . NECK SURGERY      Family History  Problem Relation Age of Onset  . Heart disease Mother   . Heart disease Father   .  Lymphoma Father    Social History  Substance Use Topics  . Smoking status: Former Smoker    Packs/day: 5.00    Years: 40.00    Types: Cigarettes    Quit date: 09/24/2007  . Smokeless tobacco: Never Used  . Alcohol use 0.0 oz/week     Comment: daily, beer and hard liquor    Prior to Admission medications   Medication Sig Start Date End Date Taking? Authorizing Provider  albuterol (PROVENTIL HFA;VENTOLIN HFA) 108 (90 BASE) MCG/ACT inhaler Inhale 2 puffs into the lungs 2 (two) times  daily.    Yes Historical Provider, MD  amLODipine (NORVASC) 10 MG tablet Take 10 mg by mouth daily.   Yes Historical Provider, MD  benztropine (COGENTIN) 0.5 MG tablet Take 0.5 mg by mouth daily.   Yes Historical Provider, MD  budesonide-formoterol (SYMBICORT) 160-4.5 MCG/ACT inhaler Inhale 2 puffs into the lungs 2 (two) times daily.    Yes Historical Provider, MD  carvedilol (COREG) 12.5 MG tablet Take 12.5 mg by mouth 2 (two) times daily.    Yes Historical Provider, MD  fenofibrate 160 MG tablet Take 160 mg by mouth daily.   Yes Historical Provider, MD  nystatin (MYCOSTATIN) 100000 UNIT/ML suspension Take 5 mLs by mouth 4 (four) times daily. Swish and swallow   Yes Historical Provider, MD  predniSONE (DELTASONE) 10 MG tablet Take 10 mg by mouth daily. 09/20/15  Yes Historical Provider, MD  prochlorperazine (COMPAZINE) 5 MG tablet Take 5 mg by mouth 3 (three) times daily as needed for nausea or vomiting.    Yes Historical Provider, MD  RABEprazole (ACIPHEX) 20 MG tablet Take 20 mg by mouth daily.   Yes Historical Provider, MD  roflumilast (DALIRESP) 500 MCG TABS tablet Take 500 mcg by mouth daily.   Yes Historical Provider, MD  sertraline (ZOLOFT) 100 MG tablet Take 100 mg by mouth 2 (two) times daily. Reported on 07/17/2015   Yes Historical Provider, MD  tamsulosin (FLOMAX) 0.4 MG CAPS capsule Take 1 capsule (0.4 mg total) by mouth daily. Patient taking differently: Take 0.4 mg by mouth 2 (two) times daily.  07/09/15  Yes Crist FatBenjamin W Herrick, MD  tiotropium (SPIRIVA) 18 MCG inhalation capsule Place 18 mcg into inhaler and inhale daily.   Yes Historical Provider, MD  traMADol (ULTRAM) 50 MG tablet Take 1 tablet (50 mg total) by mouth every 6 (six) hours as needed. 07/17/15  Yes Yevette EdwardsJames G Adams, MD  ziprasidone (GEODON) 60 MG capsule Take 60 mg by mouth at bedtime.   Yes Historical Provider, MD  acetaminophen (TYLENOL) 500 MG tablet Take 500 mg by mouth every 6 (six) hours as needed for mild pain or  headache.    Historical Provider, MD  albuterol (PROVENTIL) (2.5 MG/3ML) 0.083% nebulizer solution Take 2.5 mg by nebulization every 6 (six) hours as needed for wheezing or shortness of breath.    Historical Provider, MD    Current Facility-Administered Medications  Medication Dose Route Frequency Provider Last Rate Last Dose  . 0.9 %  sodium chloride infusion   Intravenous Continuous Enid Baasadhika Kalisetti, MD 100 mL/hr at 01/01/16 0940    . acetaminophen (TYLENOL) tablet 650 mg  650 mg Oral Q6H PRN Oralia Manisavid Willis, MD       Or  . acetaminophen (TYLENOL) suppository 650 mg  650 mg Rectal Q6H PRN Oralia Manisavid Willis, MD      . amLODipine (NORVASC) tablet 10 mg  10 mg Oral Daily Oralia Manisavid Willis, MD   10 mg at 01/01/16  1610  . carvedilol (COREG) tablet 12.5 mg  12.5 mg Oral BID WC Oralia Manis, MD   12.5 mg at 01/01/16 9604  . enoxaparin (LOVENOX) injection 40 mg  40 mg Subcutaneous QHS Oralia Manis, MD   40 mg at 12/31/15 1957  . famotidine (PEPCID) IVPB 20 mg premix  20 mg Intravenous Q12H Enid Baas, MD   20 mg at 01/01/16 1035  . mometasone-formoterol (DULERA) 200-5 MCG/ACT inhaler 2 puff  2 puff Inhalation BID Oralia Manis, MD   2 puff at 01/01/16 0940  . morphine 4 MG/ML injection 4 mg  4 mg Intravenous Q4H PRN Oralia Manis, MD   4 mg at 01/01/16 1031  . ondansetron (ZOFRAN) tablet 4 mg  4 mg Oral Q6H PRN Oralia Manis, MD       Or  . ondansetron [facility]) injection 4 mg  4 mg Intravenous Q6H PRN Oralia Manis, MD      . pantoprazole (PROTONIX) EC tablet 40 mg  40 mg Oral QAC breakfast Oralia Manis, MD   40 mg at 01/01/16 5409  . roflumilast (DALIRESP) tablet 500 mcg  500 mcg Oral Daily Oralia Manis, MD   500 mcg at 01/01/16 [phone]  . sertraline (ZOLOFT) tablet 100 mg  100 mg Oral BID Oralia Manis, MD   100 mg at 01/01/16 1478  . tamsulosin (FLOMAX) capsule 0.4 mg  0.4 mg Oral Daily Oralia Manis, MD   0.4 mg at 01/01/16 2956  . tiotropium (SPIRIVA) inhalation capsule 18 mcg  18 mcg Inhalation Daily Oralia Manis, MD   18 mcg at 01/01/16 0940  . ziprasidone (GEODON) capsule 60 mg  60 mg Oral QHS Arnaldo Natal, MD   60 mg at 12/31/15 2003    Allergies as of 12/30/2015 - Review Complete 12/30/2015  Allergen Reaction Noted  . Contrast media [iodinated diagnostic agents] Anaphylaxis 12/06/2015  . Aspirin Other (See Comments) and Itching 12/24/2014  . Ciprofloxacin hcl Other (See Comments) 12/24/2014  . Ibuprofen Other (See Comments) and Itching 12/24/2014  . Metronidazole Other (See Comments) 12/24/2014  . Naproxen Other (See Comments) 12/24/2014  . Oxycodone Itching 07/01/2015  . Tetracyclines & related Other (See Comments) 12/24/2014  . Ciprofloxacin Rash 07/01/2015  . Nitrofurantoin Rash 07/01/2015  . Sulfa antibiotics Rash 07/01/2015  . Tetracycline Rash 07/01/2015     Review of Systems:    All systems reviewed and negative except where noted in HPI, with the exception of SOb/DOE, chronic 02 use, tremors, weakness, chronic thoracic and lower back pain, pTSD. Has been following with Dr Pernell Dupre for chronic back pain.      Physical Exam:  Vital signs in last 24 hours: Temp:  [97.5 F (36.4 C)-99.1 F (37.3 C)] 97.5 F (36.4 C) (08/30 1535) Pulse Rate:  [71-82] 71 (08/30 1535) Resp:  [16-18] 16 (08/30 1535) BP: (115-126)/(54-73) 126/73 (08/30 1535) SpO2:  [95 %-100 %] 100 % (08/30 1535) Last BM Date: 12/29/15 General:   Pleasant man in NAD Head:  Normocephalic and atraumatic. Eyes:   No icterus.   Conjunctiva pink. Ears:  Normal auditory acuity. Mouth: Mucosa pink moist, no lesions. Neck:  Supple; no masses felt Lungs:  Respirations even and unlabored. Lungs clear to auscultation bilaterally.   No wheezes, crackles, or rhonchi.  Heart:  S1S2, RRR, no MRG. No edema. Abdomen:   Flat, soft, nondistended, Diffuse tenderness across the upper abdomen. Normal bowel sounds. No appreciable masses or hepatomegaly. No rebound signs or other peritoneal signs. Rectal:  Stool brown,  small hemorrhoids, nontender, heme negative.  Msk:  MAEW x4, No clubbing or cyanosis. Strength 5/5. Symmetrical without gross deformities. Neurologic:  Alert and  oriented x4;  Cranial nerves II-XII intact.  Skin:  Warm, dry, pink without significant lesions or rashes. Psych:  Alert and cooperative. Normal affect.  LAB RESULTS:  Recent Labs  12/30/15 1915 12/31/15 0328  WBC 9.0 6.6  HGB 13.0 11.6*  HCT 38.2* 33.3*  PLT 212 173   BMET  Recent Labs  12/30/15 1915 12/31/15 0328 01/01/16 0530  NA 132* 134* 135  K 2.9* 3.2* 3.5  CL 103 104 108  CO2 21* 25 18*  GLUCOSE 122* 109* 81  BUN 11 11 9   CREATININE 0.91 1.08 0.94  CALCIUM 8.9 8.6* 8.4*   LFT  Recent Labs  01/01/16 0530  PROT 5.7*  ALBUMIN 2.8*  AST 10*  ALT 5*  ALKPHOS 33*  BILITOT 0.6   PT/INR  Recent Labs  12/30/15 1915  LABPROT 15.4*  INR 1.21    STUDIES: No results found.     Impression / Plan:   1.Chronic upper abdominal pain. Mildly elevated lipase which has been improving. I think the black emesis and stool was likely related to the peptobismol he took on Monday and Sunday. His hgb is very stable and he is heme negative, stool brown. 2 view abdominal films today. He is a high risk candidate for sedated procedures due to his chronic lung disease- hold EGD due to poor pulmonary status. Recommend UGIS for the upper pain to rule out other etiologies.  H pylor stool antigen. 2. Recent c-diff. Treated with vanc. Last bms Monday- formed/sticky- would recheck if diarrhea restarts. Further recommendations to follow.  Thank you very much for this consult. These services were provided by Vevelyn Pat, NP-C, in collaboration with Christena Deem, MD, with whom I have discussed this patient in full.   Vevelyn Pat, NP-C

## 2016-01-02 ENCOUNTER — Inpatient Hospital Stay: Payer: Medicare PPO

## 2016-01-02 LAB — BLOOD GAS, ARTERIAL
Acid-base deficit: 1.3 mmol/L (ref 0.0–2.0)
Allens test (pass/fail): POSITIVE — AB
Bicarbonate: 22.4 mmol/L (ref 20.0–28.0)
FIO2: 0.21
O2 Saturation: 89.9 %
Patient temperature: 37
pCO2 arterial: 33 mmHg (ref 32.0–48.0)
pH, Arterial: 7.44 (ref 7.350–7.450)
pO2, Arterial: 56 mmHg — ABNORMAL LOW (ref 83.0–108.0)

## 2016-01-02 LAB — CBC
HCT: 30 % — ABNORMAL LOW (ref 40.0–52.0)
HEMOGLOBIN: 10.5 g/dL — AB (ref 13.0–18.0)
MCH: 30.5 pg (ref 26.0–34.0)
MCHC: 35 g/dL (ref 32.0–36.0)
MCV: 87.1 fL (ref 80.0–100.0)
Platelets: 172 10*3/uL (ref 150–440)
RBC: 3.44 MIL/uL — AB (ref 4.40–5.90)
RDW: 14.3 % (ref 11.5–14.5)
WBC: 6.2 10*3/uL (ref 3.8–10.6)

## 2016-01-02 LAB — LIPASE, BLOOD: LIPASE: 138 U/L — AB (ref 11–51)

## 2016-01-02 MED ORDER — IPRATROPIUM-ALBUTEROL 0.5-2.5 (3) MG/3ML IN SOLN
3.0000 mL | Freq: Four times a day (QID) | RESPIRATORY_TRACT | Status: DC
  Administered 2016-01-02 – 2016-01-04 (×10): 3 mL via RESPIRATORY_TRACT
  Filled 2016-01-02 (×10): qty 3

## 2016-01-02 MED ORDER — METHYLPREDNISOLONE SODIUM SUCC 125 MG IJ SOLR
125.0000 mg | Freq: Once | INTRAMUSCULAR | Status: AC
  Administered 2016-01-02: 125 mg via INTRAVENOUS
  Filled 2016-01-02: qty 2

## 2016-01-02 MED ORDER — METHYLPREDNISOLONE SODIUM SUCC 125 MG IJ SOLR
60.0000 mg | Freq: Three times a day (TID) | INTRAMUSCULAR | Status: DC
  Administered 2016-01-02 – 2016-01-03 (×2): 60 mg via INTRAVENOUS
  Filled 2016-01-02 (×2): qty 2

## 2016-01-02 MED ORDER — DEXTROSE 5 % IV SOLN
500.0000 mg | Freq: Once | INTRAVENOUS | Status: AC
  Administered 2016-01-02: 18:00:00 500 mg via INTRAVENOUS
  Filled 2016-01-02 (×2): qty 500

## 2016-01-02 MED ORDER — FUROSEMIDE 10 MG/ML IJ SOLN
20.0000 mg | Freq: Once | INTRAMUSCULAR | Status: AC
  Administered 2016-01-02: 16:00:00 20 mg via INTRAVENOUS
  Filled 2016-01-02: qty 2

## 2016-01-02 MED ORDER — ALBUTEROL SULFATE (2.5 MG/3ML) 0.083% IN NEBU: 2.5000 mg | INHALATION_SOLUTION | RESPIRATORY_TRACT | Status: DC | PRN

## 2016-01-02 MED ORDER — FUROSEMIDE 20 MG PO TABS
20.0000 mg | ORAL_TABLET | Freq: Once | ORAL | Status: AC
  Administered 2016-01-02: 13:00:00 20 mg via ORAL
  Filled 2016-01-02: qty 1

## 2016-01-02 NOTE — Progress Notes (Signed)
Rounding performed with attending MD at bedside. MD notified of patient respiratory status decline and patient was assessed by MD. MD to place orders. Bo McclintockBrewer,Hillarie Harrigan S, RN

## 2016-01-02 NOTE — Consult Note (Signed)
Subjective: Patient seen for abdominal pain.  Denies n/v or abdominal pain today.  One small bm last night.  Tolerated UGIS.  Epigastric discomfort with hiccoughs.   Objective: Vital signs in last 24 hours: Temp:  [97.9 F (36.6 C)-98 F (36.7 C)] 98 F (36.7 C) (08/31 0456) Pulse Rate:  [70-90] 90 (08/31 1041) Resp:  [18-24] 24 (08/31 1041) BP: (128-159)/(63-74) 156/63 (08/31 1041) SpO2:  [96 %-100 %] 96 % (08/31 1435) Blood pressure (!) 156/63, pulse 90, temperature 98 F (36.7 C), temperature source Oral, resp. rate (!) 24, height 5\' 5"  (1.651 m), weight 68.9 kg (152 lb), SpO2 96 %.   Intake/Output from previous day: 08/30 0701 - 08/31 0700 In: 2320 [I.V.:2220; IV Piggyback:100] Out: 225 [Urine:225]  Intake/Output this shift: No intake/output data recorded.   General appearance:  40 male no distress.  Patient has epigastric discomfort with hiccoughs.  Resp:  Coarse wheeze/occasional rhonchi.  Cardio:  rrr GI:  Soft nt, nondistended, bowel sounds positive  Extremities:     Lab Results: Results for orders placed or performed during the hospital encounter of 12/30/15 (from the past 24 hour(s))  Ammonia     Status: None   Collection Time: 01/01/16  7:25 PM  Result Value Ref Range   Ammonia 9 9 - 35 umol/L  Lipase, blood     Status: Abnormal   Collection Time: 01/02/16  4:51 AM  Result Value Ref Range   Lipase 138 (H) 11 - 51 U/L  CBC     Status: Abnormal   Collection Time: 01/02/16  4:51 AM  Result Value Ref Range   WBC 6.2 3.8 - 10.6 K/uL   RBC 3.44 (L) 4.40 - 5.90 MIL/uL   Hemoglobin 10.5 (L) 13.0 - 18.0 g/dL   HCT 16.1 (L) 09.6 - 04.5 %   MCV 87.1 80.0 - 100.0 fL   MCH 30.5 26.0 - 34.0 pg   MCHC 35.0 32.0 - 36.0 g/dL   RDW 40.9 81.1 - 91.4 %   Platelets 172 150 - 440 K/uL      Recent Labs  12/30/15 1915 12/31/15 0328 01/02/16 0451  WBC 9.0 6.6 6.2  HGB 13.0 11.6* 10.5*  HCT 38.2* 33.3* 30.0*  PLT 212 173 172   BMET  Recent Labs  12/30/15 1915  12/31/15 0328 01/01/16 0530  NA 132* 134* 135  K 2.9* 3.2* 3.5  CL 103 104 108  CO2 21* 25 18*  GLUCOSE 122* 109* 81  BUN 11 11 9   CREATININE 0.91 1.08 0.94  CALCIUM 8.9 8.6* 8.4*   LFT  Recent Labs  01/01/16 0530  PROT 5.7*  ALBUMIN 2.8*  AST 10*  ALT 5*  ALKPHOS 33*  BILITOT 0.6   PT/INR  Recent Labs  12/30/15 1915  LABPROT 15.4*  INR 1.21   Hepatitis Panel No results for input(s): HEPBSAG, HCVAB, HEPAIGM, HEPBIGM in the last 72 hours. C-Diff No results for input(s): CDIFFTOX in the last 72 hours. No results for input(s): CDIFFPCR in the last 72 hours.   Studies/Results: Dg Chest 1 View  Result Date: 01/02/2016 CLINICAL DATA:  Shortness of breath. EXAM: CHEST 1 VIEW COMPARISON:  Single-view of the chest 12/11/2015 and 04/09/2015. CT chest 06/12/2014. FINDINGS: Heart size is mildly enlarged. Lungs are clear. No pneumothorax or pleural effusion. Pacing device remains in place. Aortic atherosclerosis is noted. IMPRESSION: No acute disease. Electronically Signed   By: Drusilla Kanner M.D.   On: 01/02/2016 12:53   Dg Chest [facility]  Result Date: 01/02/2016 CLINICAL DATA:  Status post PICC placement today. EXAM: PORTABLE CHEST 1 VIEW COMPARISON:  Single-view of the chest earlier today. FINDINGS: New right PICC is in place. The tip of the catheter is difficult to visualize due to leads from an AICD. The tip appears to be just above the superior cavoatrial junction. There is cardiomegaly. Lungs are clear. No pneumothorax or pleural effusion. IMPRESSION: Tip of new PICC appears to lie just above the superior cavoatrial junction. Electronically Signed   By: Drusilla Kannerhomas  Dalessio M.D.   On: 01/02/2016 14:11   Dg Abd 2 Views  Result Date: 01/02/2016 CLINICAL DATA:  Abdominal pain. EXAM: ABDOMEN - 2 VIEW COMPARISON:  12/05/2015.  CT 12/05/2015. FINDINGS: AICD noted. Lucencies noted over the upper abdomen on supine view are consistent with skin folds. Upright view reveals no free  air. No bowel distention. Contrast in the bladder. Aortoiliac and renal vascular calcification again noted. Degenerative changes lumbar spine and both hips. IMPRESSION: 1.  No acute abnormality.  No bowel distention . 2. Aortoiliac and renal atherosclerotic vascular disease . Electronically Signed   By: Maisie Fushomas  Register   On: 01/02/2016 08:19   Dg Kayleen MemosUgi W/o Kub  Result Date: 01/02/2016 CLINICAL DATA:  Abdominal pain. EXAM: UPPER GI SERIES WITHOUT KUB TECHNIQUE: Routine upper GI series was performed with thin density barium. FLUOROSCOPY TIME:  Radiation Exposure Index (as provided by the fluoroscopic device): 48.6 mGy COMPARISON:  Abdominal series 01/02/2016.  CT 12/05/2015. FINDINGS: The esophagus is widely patent. Minimal gastric fold thickening is noted. Gastritis cannot be excluded. Duodenum and C-loop are unremarkable or visualized. Prominent gastroesophageal reflux noted. IMPRESSION: 1. Prominent gastroesophageal reflux. 2. Mild thickening of gastric folds. Gastritis cannot be excluded . Electronically Signed   By: Maisie Fushomas  Register   On: 01/02/2016 08:50    Scheduled Inpatient Medications:   . amLODipine  10 mg Oral Daily  . azithromycin  500 mg Intravenous Once  . carvedilol  12.5 mg Oral BID WC  . enoxaparin (LOVENOX) injection  40 mg Subcutaneous QHS  . famotidine (PEPCID) IV  20 mg Intravenous Q12H  . furosemide  20 mg Intravenous Once  . ipratropium-albuterol  3 mL Nebulization Q6H  . methylPREDNISolone (SOLU-MEDROL) injection  60 mg Intravenous Q8H  . mometasone-formoterol  2 puff Inhalation BID  . pantoprazole  40 mg Oral QAC breakfast  . roflumilast  500 mcg Oral Daily  . sertraline  100 mg Oral BID  . tamsulosin  0.4 mg Oral Daily  . ziprasidone  60 mg Oral QHS    Continuous Inpatient Infusions:     PRN Inpatient Medications:  acetaminophen **OR** acetaminophen, albuterol, morphine injection, ondansetron **OR** ondansetron (ZOFRAN) IV  Miscellaneous:   Assessment:  1)  epigastric pain, small black stool noted yesterday.   hgb stable.  Stable hemodynamics.   Denies abd pain today.  UGIS with GERD, possible gastritis.  H/O  Chronic pancreatitis.   Plan:  1) continue daily ppi, awaiting result of H pylori testing.  Previous testing indicated normal pancreatic fecal elastase/likely not pancreatic malabsorbtion.  2) increased shortness of breath today, continues with some orientation difficulty, normal ammonia.   Christena DeemMartin U Ivy Puryear MD 01/02/2016, 3:48 PM

## 2016-01-02 NOTE — Progress Notes (Addendum)
[facility] - [place] at [facility]   PATIENT NAME: Robert Ball    MR#:  [id number]  DATE OF BIRTH:  [birth year]  SUBJECTIVE:  CHIEF COMPLAINT:  Patient is reporting chest tightness. positive C-diff result at PCP office on 12/27/15.  REVIEW OF SYSTEMS:  CONSTITUTIONAL: No fever, fatigue or weakness.  EYES: No blurred or double vision.  EARS, NOSE, AND THROAT: No tinnitus or ear pain.  RESPIRATORY:Reporting chest tightness No cough, shortness of breath, wheezing or hemoptysis.  CARDIOVASCULAR: No chest pain, orthopnea, edema.  GASTROINTESTINAL: No nausea, vomiting, diarrhea .Reporting epigastric abdominal pain.  GENITOURINARY: No dysuria, hematuria.  ENDOCRINE: No polyuria, nocturia,  HEMATOLOGY: No anemia, easy bruising or bleeding SKIN: No rash or lesion. MUSCULOSKELETAL: No joint pain or arthritis.   NEUROLOGIC: No tingling, numbness, weakness.  PSYCHIATRY: No anxiety or depression.   DRUG ALLERGIES:   Allergies  Allergen Reactions  . Contrast Media [Iodinated Diagnostic Agents] Anaphylaxis    Angioedema  . Aspirin Other (See Comments) and Itching    Pt is unable to take this medication.    . Ciprofloxacin Hcl Other (See Comments)    Reaction:  Unknown   . Ibuprofen Other (See Comments) and Itching    Pt is unable to take this medication.    . Metronidazole Other (See Comments)    Reaction:  Unknown   . Naproxen Other (See Comments)    Pt is unable to take this medication.    . Oxycodone Itching  . Tetracyclines & Related Other (See Comments)    Reaction:  Unknown   . Ciprofloxacin Rash  . Nitrofurantoin Rash  . Sulfa Antibiotics Rash  . Tetracycline Rash    VITALS:  Blood pressure (!) 156/63, pulse 90, temperature 98 F (36.7 C), temperature source Oral, resp. rate (!) 24, height 5\' 5"  (1.651 m), weight 68.9 kg (152 lb), SpO2 100 %.  PHYSICAL EXAMINATION:  GENERAL:  70 y.o.-year-old patient lying in the bed with no acute distress.   EYES: Pupils equal, round, reactive to light and accommodation. No scleral icterus. Extraocular muscles intact.  HEENT: Head atraumatic, normocephalic. Oropharynx and nasopharynx clear.  NECK:  Supple, no jugular venous distention. No thyroid enlargement, no tenderness.  LUNGS:   diminished breath sounds bilaterally, diffuse minimal  wheezing, few  rales, no rhonchi or crepitation. Positive use of accessory muscles of respiration.  CARDIOVASCULAR: S1, S2 normal. No murmurs, rubs, or gallops.  ABDOMEN: Soft, epigastric tenderness, no rebound tenderness nondistended. Bowel sounds present. No organomegaly or mass.  EXTREMITIES: No pedal edema, cyanosis, or clubbing.  NEUROLOGIC: Cranial nerves II through XII are intact. Muscle strength 5/5 in all extremities. Sensation intact. Gait not checked.  PSYCHIATRIC: The patient is alert and oriented x 3.  SKIN: No obvious rash, lesion, or ulcer.    LABORATORY PANEL:   CBC  Recent Labs Lab 01/02/16 0451  WBC 6.2  HGB 10.5*  HCT 30.0*  PLT 172   ------------------------------------------------------------------------------------------------------------------  Chemistries   Recent Labs Lab 01/01/16 0530  NA 135  K 3.5  CL 108  CO2 18*  GLUCOSE 81  BUN 9  CREATININE 0.94  CALCIUM 8.4*  AST 10*  ALT 5*  ALKPHOS 33*  BILITOT 0.6   ------------------------------------------------------------------------------------------------------------------  Cardiac Enzymes No results for input(s): TROPONINI in the last 168 hours. ------------------------------------------------------------------------------------------------------------------  RADIOLOGY:  Dg Abd 2 Views  Result Date: 01/02/2016 CLINICAL DATA:  Abdominal pain. EXAM: ABDOMEN - 2 VIEW COMPARISON:  12/05/2015.  CT 12/05/2015. FINDINGS: AICD noted. Lucencies  noted over the upper abdomen on supine view are consistent with skin folds. Upright view reveals no free air. No bowel  distention. Contrast in the bladder. Aortoiliac and renal vascular calcification again noted. Degenerative changes lumbar spine and both hips. IMPRESSION: 1.  No acute abnormality.  No bowel distention . 2. Aortoiliac and renal atherosclerotic vascular disease . Electronically Signed   By: Maisie Fushomas  Register   On: 01/02/2016 08:19   Dg Kayleen MemosUgi W/o Kub  Result Date: 01/02/2016 CLINICAL DATA:  Abdominal pain. EXAM: UPPER GI SERIES WITHOUT KUB TECHNIQUE: Routine upper GI series was performed with thin density barium. FLUOROSCOPY TIME:  Radiation Exposure Index (as provided by the fluoroscopic device): 48.6 mGy COMPARISON:  Abdominal series 01/02/2016.  CT 12/05/2015. FINDINGS: The esophagus is widely patent. Minimal gastric fold thickening is noted. Gastritis cannot be excluded. Duodenum and C-loop are unremarkable or visualized. Prominent gastroesophageal reflux noted. IMPRESSION: 1. Prominent gastroesophageal reflux. 2. Mild thickening of gastric folds. Gastritis cannot be excluded . Electronically Signed   By: Maisie Fushomas  Register   On: 01/02/2016 08:50    EKG:   Orders placed or performed during the hospital encounter of 06/27/15  . EKG 12-Lead  . EKG 12-Lead  . ED EKG  . ED EKG   Upper GI series with GERD  ASSESSMENT AND PLAN:   70 year old male with past medical history significant for COPD on home oxygen, CAD, cardiac defibrillator in place, hyperlipidemia presents to the hospital secondary to worsening abdominal pain and nausea.  #Acute epigastric abdominal pain secondary to GERD and acute on chronic pancreatitis-noted to have acute pancreatitis during his last admission 3 weeks ago. Lipase is still elevated-104-89-88-138 Continue nothing by mouth -IV fluids and monitor lipase. -Upper GI series with GERD , H. pylori result is pending  -PPI  -CT of the abdomen from 3 weeks ago showing several coarse calcification on the pancreas indicating possible chronic pancreatitis. -No gallstones noted at  the time.Treated for idiopathic pancreatitis -Appreciate GI recommendations  #Shortness of breath secondary to COPD exacerbation and fluid overload   Lasix 1 dose IV  Chest x-ray with no acute findings PICC line placed for poor IV access Provide Solu-Medrol IV and nebulizer treatments azithromycin for anti-inflammatory effect  # hypokalemia-being replaced. Monitor. Potassium is 3.5 today  # hypertension-continue on Norvasc, Coreg.   # depression-continue home medications.  #stool test is positive for C. difficile toxin but patient does not have diarrhea Patient just finished 14 days of vancomycin and denying any diarrhea Not considering antibiotics at this time. Discussed with gastroenterology agrees with the current plan   #6 DVT prophylaxis-on Lovenox      All the records are reviewed and case discussed with Care Management/Social Workerr. Management plans discussed with the patient, family and they are in agreement.  CODE STATUS: fc   TOTAL TIME TAKING CARE OF THIS PATIENT: 36  minutes.   POSSIBLE D/C IN 2-3 DAYS, DEPENDING ON CLINICAL CONDITION.  Note: This dictation was prepared with Dragon dictation along with smaller phrase technology. Any transcriptional errors that result from this process are unintentional.   Ramonita LabGouru, Tateanna Bach M.D on 01/02/2016 at 11:51 AM  Between 7am to 6pm - Pager - [phone] After 6pm go to [email or web address] - password [email or web address] [facility]  [facility] [place] [facility]  Office  [phone]  CC: Primary care physician; Mickey FarberHIES, DAVID, MD

## 2016-01-03 LAB — CBC
HEMATOCRIT: 26 % — AB (ref 40.0–52.0)
HEMOGLOBIN: 9.1 g/dL — AB (ref 13.0–18.0)
MCH: 30.4 pg (ref 26.0–34.0)
MCHC: 35.2 g/dL (ref 32.0–36.0)
MCV: 86.6 fL (ref 80.0–100.0)
Platelets: 151 10*3/uL (ref 150–440)
RBC: 3 MIL/uL — AB (ref 4.40–5.90)
RDW: 13.8 % (ref 11.5–14.5)
WBC: 4.5 10*3/uL (ref 3.8–10.6)

## 2016-01-03 LAB — BASIC METABOLIC PANEL
ANION GAP: 16 — AB (ref 5–15)
BUN: 10 mg/dL (ref 6–20)
CO2: 18 mmol/L — AB (ref 22–32)
Calcium: 8.1 mg/dL — ABNORMAL LOW (ref 8.9–10.3)
Chloride: 101 mmol/L (ref 101–111)
Creatinine, Ser: 0.9 mg/dL (ref 0.61–1.24)
GFR calc Af Amer: >60 mL/min (ref >60)
GFR calc non Af Amer: >60 mL/min (ref >60)
GLUCOSE: 178 mg/dL — AB (ref 65–99)
POTASSIUM: 3.1 mmol/L — AB (ref 3.5–5.1)
Sodium: 135 mmol/L (ref 135–145)

## 2016-01-03 LAB — LIPASE, BLOOD: Lipase: 77 U/L — ABNORMAL HIGH (ref 11–51)

## 2016-01-03 MED ORDER — FUROSEMIDE 40 MG PO TABS
40.0000 mg | ORAL_TABLET | Freq: Every day | ORAL | Status: DC
  Administered 2016-01-03 – 2016-01-08 (×6): 40 mg via ORAL
  Filled 2016-01-03 (×7): qty 1

## 2016-01-03 MED ORDER — MORPHINE SULFATE (PF) 2 MG/ML IV SOLN
2.0000 mg | INTRAVENOUS | Status: DC | PRN
  Administered 2016-01-03 – 2016-01-07 (×7): 2 mg via INTRAVENOUS
  Filled 2016-01-03 (×8): qty 1

## 2016-01-03 MED ORDER — POTASSIUM CHLORIDE CRYS ER 20 MEQ PO TBCR
40.0000 meq | EXTENDED_RELEASE_TABLET | Freq: Once | ORAL | Status: AC
  Administered 2016-01-03: 08:00:00 40 meq via ORAL
  Filled 2016-01-03: qty 2

## 2016-01-03 MED ORDER — PANCRELIPASE (LIP-PROT-AMYL) 12000-38000 UNITS PO CPEP
36000.0000 [IU] | ORAL_CAPSULE | Freq: Three times a day (TID) | ORAL | Status: DC
  Administered 2016-01-03 – 2016-01-05 (×3): 36000 [IU] via ORAL
  Filled 2016-01-03 (×3): qty 3

## 2016-01-03 MED ORDER — HYDROCODONE-ACETAMINOPHEN 5-325 MG PO TABS
1.0000 | ORAL_TABLET | ORAL | Status: DC | PRN
  Administered 2016-01-04 (×2): 1 via ORAL
  Filled 2016-01-03 (×2): qty 1

## 2016-01-03 MED ORDER — QUETIAPINE FUMARATE 25 MG PO TABS
25.0000 mg | ORAL_TABLET | Freq: Once | ORAL | Status: AC
Start: 2016-01-03 — End: 2016-01-03
  Administered 2016-01-03: 02:00:00 25 mg via ORAL
  Filled 2016-01-03: qty 1

## 2016-01-03 MED ORDER — METHYLPREDNISOLONE SODIUM SUCC 40 MG IJ SOLR
40.0000 mg | Freq: Three times a day (TID) | INTRAMUSCULAR | Status: DC
  Administered 2016-01-03 – 2016-01-04 (×4): 40 mg via INTRAVENOUS
  Filled 2016-01-03 (×5): qty 1

## 2016-01-03 MED ORDER — POTASSIUM CHLORIDE CRYS ER 20 MEQ PO TBCR
40.0000 meq | EXTENDED_RELEASE_TABLET | Freq: Every day | ORAL | Status: DC
  Administered 2016-01-04 – 2016-01-07 (×4): 40 meq via ORAL
  Filled 2016-01-03 (×6): qty 2

## 2016-01-03 NOTE — Consult Note (Signed)
Subjective: Patient seen for acute on chronic epigastric pain, pancreatitis.  Complaining today of epigastric pain, appears mildly uncomfortable, no acute issute, awaiting pain meds.  Trialed clears today.  No n/v.  Per wife, continues with some mild disorientation. More irritable today.  Objective: Vital signs in last 24 hours: Temp:  [97.2 F (36.2 C)-98.7 F (37.1 C)] 97.2 F (36.2 C) (09/01 1136) Pulse Rate:  [73-82] 80 (09/01 1610) Resp:  [16-20] 20 (09/01 1136) BP: (110-137)/(47-86) 137/61 (09/01 1136) SpO2:  [96 %-100 %] 96 % (09/01 1610) Blood pressure 137/61, pulse 80, temperature 97.2 F (36.2 C), temperature source Oral, resp. rate 20, height 5\' 5"  (1.651 m), weight 68.9 kg (152 lb), SpO2 96 %.   Intake/Output from previous day: 08/31 0701 - 09/01 0700 In: 350 [IV Piggyback:350] Out: 1285 [Urine:1285]  Intake/Output this shift: Total I/O In: -  Out: 320 [Urine:320]   General appearance:  Elderly 70 no acute distress Resp:  bcta Cardio:  rrr GI:   Mild protuberant, tender epigastrum Extremities:  No cce   Lab Results: Results for orders placed or performed during the hospital encounter of 12/30/15 (from the past 24 hour(s))  CBC     Status: Abnormal   Collection Time: 01/03/16  4:19 AM  Result Value Ref Range   WBC 4.5 3.8 - 10.6 K/uL   RBC 3.00 (L) 4.40 - 5.90 MIL/uL   Hemoglobin 9.1 (L) 13.0 - 18.0 g/dL   HCT 16.1 (L) 09.6 - 04.5 %   MCV 86.6 80.0 - 100.0 fL   MCH 30.4 26.0 - 34.0 pg   MCHC 35.2 32.0 - 36.0 g/dL   RDW 40.9 81.1 - 91.4 %   Platelets 151 150 - 440 K/uL  Basic metabolic panel     Status: Abnormal   Collection Time: 01/03/16  4:19 AM  Result Value Ref Range   Sodium 135 135 - 145 mmol/L   Potassium 3.1 (L) 3.5 - 5.1 mmol/L   Chloride 101 101 - 111 mmol/L   CO2 18 (L) 22 - 32 mmol/L   Glucose, Bld 178 (H) 65 - 99 mg/dL   BUN 10 6 - 20 mg/dL   Creatinine, Ser 7.82 0.61 - 1.24 mg/dL   Calcium 8.1 (L) 8.9 - 10.3 mg/dL   GFR calc non Af  Amer >60 >60 mL/min   GFR calc Af Amer >60 >60 mL/min   Anion gap 16 (H) 5 - 15  Lipase, blood     Status: Abnormal   Collection Time: 01/03/16  4:19 AM  Result Value Ref Range   Lipase 77 (H) 11 - 51 U/L      Recent Labs  01/02/16 0451 01/03/16 0419  WBC 6.2 4.5  HGB 10.5* 9.1*  HCT 30.0* 26.0*  PLT 172 151   BMET  Recent Labs  01/01/16 0530 01/03/16 0419  NA 135 135  K 3.5 3.1*  CL 108 101  CO2 18* 18*  GLUCOSE 81 178*  BUN 9 10  CREATININE 0.94 0.90  CALCIUM 8.4* 8.1*   LFT  Recent Labs  01/01/16 0530  PROT 5.7*  ALBUMIN 2.8*  AST 10*  ALT 5*  ALKPHOS 33*  BILITOT 0.6   PT/INR No results for input(s): LABPROT, INR in the last 72 hours. Hepatitis Panel No results for input(s): HEPBSAG, HCVAB, HEPAIGM, HEPBIGM in the last 72 hours. C-Diff No results for input(s): CDIFFTOX in the last 72 hours. No results for input(s): CDIFFPCR in the last 72 hours.   Studies/Results:  Dg Chest 1 View  Result Date: 01/02/2016 CLINICAL DATA:  Shortness of breath. EXAM: CHEST 1 VIEW COMPARISON:  Single-view of the chest 12/11/2015 and 04/09/2015. CT chest 06/12/2014. FINDINGS: Heart size is mildly enlarged. Lungs are clear. No pneumothorax or pleural effusion. Pacing device remains in place. Aortic atherosclerosis is noted. IMPRESSION: No acute disease. Electronically Signed   By: Drusilla Kannerhomas  Dalessio M.D.   On: 01/02/2016 12:53   Dg Chest Port 1 View  Result Date: 01/02/2016 CLINICAL DATA:  Status post PICC placement today. EXAM: PORTABLE CHEST 1 VIEW COMPARISON:  Single-view of the chest earlier today. FINDINGS: New right PICC is in place. The tip of the catheter is difficult to visualize due to leads from an AICD. The tip appears to be just above the superior cavoatrial junction. There is cardiomegaly. Lungs are clear. No pneumothorax or pleural effusion. IMPRESSION: Tip of new PICC appears to lie just above the superior cavoatrial junction. Electronically Signed   By: Drusilla Kannerhomas   Dalessio M.D.   On: 01/02/2016 14:11   Dg Abd 2 Views  Result Date: 01/02/2016 CLINICAL DATA:  Abdominal pain. EXAM: ABDOMEN - 2 VIEW COMPARISON:  12/05/2015.  CT 12/05/2015. FINDINGS: AICD noted. Lucencies noted over the upper abdomen on supine view are consistent with skin folds. Upright view reveals no free air. No bowel distention. Contrast in the bladder. Aortoiliac and renal vascular calcification again noted. Degenerative changes lumbar spine and both hips. IMPRESSION: 1.  No acute abnormality.  No bowel distention . 2. Aortoiliac and renal atherosclerotic vascular disease . Electronically Signed   By: Maisie Fushomas  Register   On: 01/02/2016 08:19   Dg Kayleen MemosUgi W/o Kub  Result Date: 01/02/2016 CLINICAL DATA:  Abdominal pain. EXAM: UPPER GI SERIES WITHOUT KUB TECHNIQUE: Routine upper GI series was performed with thin density barium. FLUOROSCOPY TIME:  Radiation Exposure Index (as provided by the fluoroscopic device): 48.6 mGy COMPARISON:  Abdominal series 01/02/2016.  CT 12/05/2015. FINDINGS: The esophagus is widely patent. Minimal gastric fold thickening is noted. Gastritis cannot be excluded. Duodenum and C-loop are unremarkable or visualized. Prominent gastroesophageal reflux noted. IMPRESSION: 1. Prominent gastroesophageal reflux. 2. Mild thickening of gastric folds. Gastritis cannot be excluded . Electronically Signed   By: Maisie Fushomas  Register   On: 01/02/2016 08:50    Scheduled Inpatient Medications:   . amLODipine  10 mg Oral Daily  . carvedilol  12.5 mg Oral BID WC  . enoxaparin (LOVENOX) injection  40 mg Subcutaneous QHS  . famotidine (PEPCID) IV  20 mg Intravenous Q12H  . furosemide  40 mg Oral Daily  . ipratropium-albuterol  3 mL Nebulization Q6H  . lipase/protease/amylase  36,000 Units Oral TID AC  . methylPREDNISolone (SOLU-MEDROL) injection  40 mg Intravenous Q8H  . mometasone-formoterol  2 puff Inhalation BID  . pantoprazole  40 mg Oral QAC breakfast  . [START ON 01/04/2016] potassium  chloride  40 mEq Oral Daily  . roflumilast  500 mcg Oral Daily  . sertraline  100 mg Oral BID  . tamsulosin  0.4 mg Oral Daily  . ziprasidone  60 mg Oral QHS    Continuous Inpatient Infusions:     PRN Inpatient Medications:  acetaminophen **OR** acetaminophen, albuterol, morphine injection, ondansetron **OR** ondansetron (ZOFRAN) IV  Miscellaneous:   Assessment:  1) acute on chronic pancreatitis-stable lab, unable to do CT pancreas with contrast due to contrast reaction.  No evidence on labs of biliary obstruction. Triglycerides pending.  2) reported dark stools, heme negative on consult.  UGI showing GERD  and possible gastritis.  No other issues.  Poor candidate for sedated luminal evaluation due to COPD.  Continue ppi/h2ra. Awaiting h pylori testing result.   Plan:  1) as above. Dr Mechele Collin available tomorrow if needed.   Christena Deem MD 01/03/2016, 4:50 PM

## 2016-01-03 NOTE — Care Management Important Message (Signed)
Important Message  Patient Details  Name: Robert Ball MRN: [id number] Date of Birth: [birth year]   Medicare Important Message Given:  Yes    Gwenette GreetBrenda S Teighan Aubert, RN 01/03/2016, 8:01 AM

## 2016-01-03 NOTE — Plan of Care (Signed)
Problem: Education: Goal: Knowledge of [facility] Education information/materials will improve Outcome: Not Progressing Pt alert to self. Spouse at the bedside educated and involved in the plan of care.  Problem: Pain Managment: Goal: General experience of comfort will improve Outcome: Progressing Morphine given once for abd pain with improvement.  Problem: Physical Regulation: Goal: Ability to maintain clinical measurements within normal limits will improve Outcome: Progressing Dyspnea with exertion. Continue on 3L O2 [place] as at home with O2 sats in the high 90's.  Problem: Nutrition: Goal: Adequate nutrition will be maintained Outcome: Progressing Pt tolerates clear liquid diet. No c/o n/v.

## 2016-01-03 NOTE — Progress Notes (Signed)
Patient ID: Robert Ball, male   DOB: 07/01/1945, 70 y.o.   MRN: [id number]  [facility] PROGRESS NOTE  Robert Ball FAO:[id number] DOB: 05-Nov-1945 DOA: 12/30/2015 PCP: Mickey Farber, MD  HPI/Subjective: Patient states he does not feel well. Can't elaborate much. Still some periods of confusion. States he is breathing better than last night. He still has abdominal pain but better than when he came in.  Objective: Vitals:   01/03/16 0743 01/03/16 1136  BP: (!) 127/47 137/61  Pulse: 82 76  Resp: 18 20  Temp:  97.2 F (36.2 C)    Filed Weights   12/30/15 1830  Weight: 68.9 kg (152 lb)    ROS: Review of Systems  Constitutional: Negative for chills and fever.  Eyes: Negative for blurred vision.  Respiratory: Negative for cough and shortness of breath.   Cardiovascular: Negative for chest pain.  Gastrointestinal: Positive for abdominal pain. Negative for constipation, diarrhea, nausea and vomiting.  Genitourinary: Negative for dysuria.  Musculoskeletal: Negative for joint pain.  Neurological: Negative for dizziness and headaches.   Exam: Physical Exam  HENT:  Nose: No mucosal edema.  Mouth/Throat: No oropharyngeal exudate or posterior oropharyngeal edema.  Eyes: Conjunctivae, EOM and lids are normal. Pupils are equal, round, and reactive to light.  Neck: No JVD present. Carotid bruit is not present. No edema present. No thyroid mass and no thyromegaly present.  Cardiovascular: Regular rhythm, S1 normal and S2 normal.  Exam reveals no gallop.   No murmur heard. Pulses:      Dorsalis pedis pulses are 2+ on the right side, and 2+ on the left side.  Respiratory: No respiratory distress. He has decreased breath sounds in the right lower field and the left lower field. He has no wheezes. He has no rhonchi. He has no rales.  GI: Soft. Bowel sounds are normal. There is no tenderness.  Musculoskeletal:       Right ankle: He exhibits swelling.       Left ankle: He exhibits  swelling.  Lymphadenopathy:    He has no cervical adenopathy.  Neurological: He is alert. No cranial nerve deficit.  Skin: Skin is warm. No rash noted. Nails show no clubbing.  Psychiatric: He has a normal mood and affect.      Data Reviewed: Basic Metabolic Panel:  Recent Labs Lab 12/30/15 1915 12/31/15 0328 01/01/16 0530 01/03/16 0419  NA 132* 134* 135 135  K 2.9* 3.2* 3.5 3.1*  CL 103 104 108 101  CO2 21* 25 18* 18*  GLUCOSE 122* 109* 81 178*  BUN 11 11 9 10   CREATININE 0.91 1.08 0.94 0.90  CALCIUM 8.9 8.6* 8.4* 8.1*   Liver Function Tests:  Recent Labs Lab 12/30/15 1915 01/01/16 0530  AST 14* 10*  ALT 7* 5*  ALKPHOS 41 33*  BILITOT 1.1 0.6  PROT 6.3* 5.7*  ALBUMIN 3.3* 2.8*    Recent Labs Lab 12/30/15 1915 12/31/15 0328 01/01/16 0530 01/02/16 0451 01/03/16 0419  LIPASE 104* 89* 88* 138* 77*    Recent Labs Lab 01/01/16 1925  AMMONIA 9   CBC:  Recent Labs Lab 12/30/15 1915 12/31/15 0328 01/02/16 0451 01/03/16 0419  WBC 9.0 6.6 6.2 4.5  NEUTROABS 7.7*  --   --   --   HGB 13.0 11.6* 10.5* 9.1*  HCT 38.2* 33.3* 30.0* 26.0*  MCV 87.0 87.2 87.1 86.6  PLT 212 173 172 151      Studies: Dg Chest 1 View  Result Date: 01/02/2016 CLINICAL  DATA:  Shortness of breath. EXAM: CHEST 1 VIEW COMPARISON:  Single-view of the chest 12/11/2015 and 04/09/2015. CT chest 06/12/2014. FINDINGS: Heart size is mildly enlarged. Lungs are clear. No pneumothorax or pleural effusion. Pacing device remains in place. Aortic atherosclerosis is noted. IMPRESSION: No acute disease. Electronically Signed   By: Drusilla Kannerhomas  Dalessio M.D.   On: 01/02/2016 12:53   Dg Chest Port 1 View  Result Date: 01/02/2016 CLINICAL DATA:  Status post PICC placement today. EXAM: PORTABLE CHEST 1 VIEW COMPARISON:  Single-view of the chest earlier today. FINDINGS: New right PICC is in place. The tip of the catheter is difficult to visualize due to leads from an AICD. The tip appears to be just  above the superior cavoatrial junction. There is cardiomegaly. Lungs are clear. No pneumothorax or pleural effusion. IMPRESSION: Tip of new PICC appears to lie just above the superior cavoatrial junction. Electronically Signed   By: Drusilla Kannerhomas  Dalessio M.D.   On: 01/02/2016 14:11   Dg Abd 2 Views  Result Date: 01/02/2016 CLINICAL DATA:  Abdominal pain. EXAM: ABDOMEN - 2 VIEW COMPARISON:  12/05/2015.  CT 12/05/2015. FINDINGS: AICD noted. Lucencies noted over the upper abdomen on supine view are consistent with skin folds. Upright view reveals no free air. No bowel distention. Contrast in the bladder. Aortoiliac and renal vascular calcification again noted. Degenerative changes lumbar spine and both hips. IMPRESSION: 1.  No acute abnormality.  No bowel distention . 2. Aortoiliac and renal atherosclerotic vascular disease . Electronically Signed   By: Maisie Fushomas  Register   On: 01/02/2016 08:19   Dg Kayleen MemosUgi W/o Kub  Result Date: 01/02/2016 CLINICAL DATA:  Abdominal pain. EXAM: UPPER GI SERIES WITHOUT KUB TECHNIQUE: Routine upper GI series was performed with thin density barium. FLUOROSCOPY TIME:  Radiation Exposure Index (as provided by the fluoroscopic device): 48.6 mGy COMPARISON:  Abdominal series 01/02/2016.  CT 12/05/2015. FINDINGS: The esophagus is widely patent. Minimal gastric fold thickening is noted. Gastritis cannot be excluded. Duodenum and C-loop are unremarkable or visualized. Prominent gastroesophageal reflux noted. IMPRESSION: 1. Prominent gastroesophageal reflux. 2. Mild thickening of gastric folds. Gastritis cannot be excluded . Electronically Signed   By: Maisie Fushomas  Register   On: 01/02/2016 08:50    Scheduled Meds: . amLODipine  10 mg Oral Daily  . carvedilol  12.5 mg Oral BID WC  . enoxaparin (LOVENOX) injection  40 mg Subcutaneous QHS  . famotidine (PEPCID) IV  20 mg Intravenous Q12H  . furosemide  40 mg Oral Daily  . ipratropium-albuterol  3 mL Nebulization Q6H  . methylPREDNISolone  (SOLU-MEDROL) injection  40 mg Intravenous Q8H  . mometasone-formoterol  2 puff Inhalation BID  . pantoprazole  40 mg Oral QAC breakfast  . [START ON 01/04/2016] potassium chloride  40 mEq Oral Daily  . roflumilast  500 mcg Oral Daily  . sertraline  100 mg Oral BID  . tamsulosin  0.4 mg Oral Daily  . ziprasidone  60 mg Oral QHS    Assessment/Plan:  1. Acute on chronic pancreatitis and epigastric pain. Try to start clear liquid diet today. Because of respiratory status IV fluids were stopped last night. Check triglyceride level tomorrow morning. May also need to start pancreatic enzymes. 2. COPD exacerbation. Patient given 1 dose of IV Lasix yesterday and given IV Solu-Medrol. Hold off on any antibiotics with recent C. Difficile. 3. Hypokalemia. Replace again today 4. Essential hypertension continue usual medications 5. Depression continue usual medications 6. Recent history of C. difficile colitis 7. Chronic respiratory  failure on 3 L of oxygen 8. Weakness. Physical therapy evaluation  Code Status:     Code Status Orders        Start     Ordered   12/31/15 0039  Full code  Continuous     12/31/15 0039    Code Status History    Date Active Date Inactive Code Status Order ID Comments User Context   12/05/2015  7:29 PM 12/11/2015  8:11 PM Full Code [id number]  Gwendolyn Fill, NP ED   04/09/2015 10:30 PM 04/11/2015  6:23 PM Full Code [id number]  Houston Siren, MD Inpatient   12/27/2014  3:17 PM 12/27/2014  7:47 PM Full Code [id number]  Marcina Millard, MD Inpatient     Family Communication: Spoke with wife on the phone Disposition Plan: Unclear at this time  Consultants:  Gastroenterology  Time spent: 25 minutes  Alford Highland  [employer]

## 2016-01-03 NOTE — Evaluation (Signed)
Physical Therapy Evaluation Patient Details Name: Robert Ball MRN: [id number] DOB: 27-Mar-1946 Today's Date: 01/03/2016   History of Present Illness  Pt is a 70 y.o. male presenting to hospital with rectal bleeding and abdominal pain.  Pt admitted with acute on chronic pancreatitis with chronic CHF.  Of note pt with recent admission earlier this month with abdominal pain and pancreatitis.  PMH includes end stage COPD, htn, anxiety, PTSD, htn, and 3 L continuous O2 at home.  Clinical Impression  Prior to admission, pt was ambulating (with AD as needed) and had assist from wife as needed.  Pt lives in 1 level home with 3 steps to enter (L railing) plus ramp.  Currently pt is CGA with transfers and ambulation 20 feet with RW (limited distance per pt not wanting to walk anymore).  Pt would benefit from skilled PT to address noted impairments and functional limitations.  Recommend pt discharge to home with assist with all functional mobility for safety (with use of RW) and HHPT when medically appropriate.     Follow Up Recommendations Home health PT;Supervision for mobility/OOB    Equipment Recommendations   (Pt already owns RW)    Recommendations for Other Services       Precautions / Restrictions Precautions Precautions: Fall Precaution Comments: Cardiac defibrillator Restrictions Weight Bearing Restrictions: No      Mobility  Bed Mobility               General bed mobility comments: Not assessed d/t pt sitting in bedside chair beginning and end of session.  Transfers Overall transfer level: Needs assistance Equipment used: Rolling walker (2 wheeled) Transfers: Sit to/from [employer] Sit to Stand: Min guard Stand pivot transfers: Min guard (chair to bed)       General transfer comment: pt required UE support initially to steady self but then appearing steady  Ambulation/Gait Ambulation/Gait assistance: Min guard Ambulation Distance (Feet): 20 Feet  (in room) Assistive device: Rolling walker (2 wheeled)   Gait velocity: decreased   General Gait Details: decreased B step length/foot clearance/heelstrike; limited distance d/t fatigue  Stairs            Wheelchair Mobility    Modified Rankin (Stroke Patients Only)       Balance Overall balance assessment: Needs assistance Sitting-balance support: No upper extremity supported;Feet supported Sitting balance-Leahy Scale: Good     Standing balance support: Bilateral upper extremity supported (on RW) Standing balance-Leahy Scale: Good Standing balance comment: static standing                             Pertinent Vitals/Pain Pain Assessment: 0-10 Pain Score: 4  Pain Location: abdomen Pain Descriptors / Indicators: Aching Pain Intervention(s): Limited activity within patient's tolerance;Monitored during session;Repositioned  Vitals stable and WFL throughout treatment session on 3 L/min O2 via nasal cannula.    Home Living Family/patient expects to be discharged to:: Private residence Living Arrangements: Spouse/significant other Available Help at Discharge: Family Type of Home: Mobile home Home Access: Stairs to enter   Entrance Stairs-Number of Steps: 3 with L railing and ramp Home Layout: One level Home Equipment: Walker - 2 wheels;Walker - 4 wheels;Walker - standard;Cane - single point;Bedside commode;Shower seat;Grab bars - toilet;Grab bars - tub/shower;Wheelchair - manual;Transport chair;Hospital bed      Prior Function Level of Independence: Independent with assistive device(s)         Comments: Use of AD as needed; 3  L chronic home O2; PRN assist from wife     Hand Dominance        Extremity/Trunk Assessment   Upper Extremity Assessment: Generalized weakness           Lower Extremity Assessment: Generalized weakness         Communication   Communication: HOH  Cognition Arousal/Alertness: Awake/alert Behavior During  Therapy: Flat affect Overall Cognitive Status:  (Oriented to person, hospital but not time)                      General Comments General comments (skin integrity, edema, etc.): Pt's wife present entire session and appeared involved in pt's care.  Nursing cleared pt for participation in physical therapy.  Pt agreeable to PT session.    Exercises        Assessment/Plan    PT Assessment Patient needs continued PT services  PT Diagnosis Difficulty walking;Generalized weakness;Acute pain   PT Problem List Decreased strength;Decreased activity tolerance;Decreased balance;Decreased mobility;Decreased knowledge of use of DME;Pain  PT Treatment Interventions DME instruction;Gait training;Stair training;Functional mobility training;Therapeutic activities;Therapeutic exercise;Balance training;Patient/family education   PT Goals (Current goals can be found in the Care Plan section) Acute Rehab PT Goals Patient Stated Goal: to go home PT Goal Formulation: With patient Time For Goal Achievement: 01/17/16 Potential to Achieve Goals: Good    Frequency Min 2X/week   Barriers to discharge        Co-evaluation               End of Session Equipment Utilized During Treatment: Gait belt;Oxygen Activity Tolerance: Patient limited by fatigue Patient left: in chair;with call bell/phone within reach;with family/visitor present (Pt's wife reported that pt did not need chair alarm as long as she was present (discussed with nursing and nursing confirmed this)) Nurse Communication: Mobility status;Precautions         Time: [id number] PT Time Calculation (min) (ACUTE ONLY): 19 min   Charges:   PT Evaluation $PT Eval Low Complexity: 1 Procedure     PT G CodesHendricks Limes:        Zsofia Prout 01/03/2016, 4:33 PM Hendricks LimesEmily Anaisabel Pederson, PT [phone]

## 2016-01-04 LAB — COMPREHENSIVE METABOLIC PANEL
ALK PHOS: 33 U/L — AB (ref 38–126)
ALT: 6 U/L — ABNORMAL LOW (ref 17–63)
ANION GAP: 9 (ref 5–15)
AST: 11 U/L — ABNORMAL LOW (ref 15–41)
Albumin: 2.8 g/dL — ABNORMAL LOW (ref 3.5–5.0)
BILIRUBIN TOTAL: 0.6 mg/dL (ref 0.3–1.2)
BUN: 11 mg/dL (ref 6–20)
CALCIUM: 8.5 mg/dL — AB (ref 8.9–10.3)
CO2: 26 mmol/L (ref 22–32)
Chloride: 103 mmol/L (ref 101–111)
Creatinine, Ser: 0.7 mg/dL (ref 0.61–1.24)
GLUCOSE: 142 mg/dL — AB (ref 65–99)
POTASSIUM: 3.1 mmol/L — AB (ref 3.5–5.1)
Sodium: 138 mmol/L (ref 135–145)
TOTAL PROTEIN: 5.7 g/dL — AB (ref 6.5–8.1)

## 2016-01-04 LAB — H. PYLORI ANTIGEN, STOOL: H. Pylori Stool Ag, Eia: NEGATIVE

## 2016-01-04 LAB — LIPASE, BLOOD: LIPASE: 87 U/L — AB (ref 11–51)

## 2016-01-04 LAB — TRIGLYCERIDES: Triglycerides: 154 mg/dL — ABNORMAL HIGH (ref <150)

## 2016-01-04 MED ORDER — METHYLPREDNISOLONE SODIUM SUCC 40 MG IJ SOLR
40.0000 mg | Freq: Two times a day (BID) | INTRAMUSCULAR | Status: DC
  Administered 2016-01-04 – 2016-01-05 (×2): 40 mg via INTRAVENOUS
  Filled 2016-01-04 (×2): qty 1

## 2016-01-04 MED ORDER — POTASSIUM CHLORIDE CRYS ER 20 MEQ PO TBCR
40.0000 meq | EXTENDED_RELEASE_TABLET | Freq: Once | ORAL | Status: AC
Start: 2016-01-04 — End: 2016-01-04
  Administered 2016-01-04: 40 meq via ORAL
  Filled 2016-01-04: qty 2

## 2016-01-04 MED ORDER — MAGNESIUM SULFATE 2 GM/50ML IV SOLN
2.0000 g | Freq: Once | INTRAVENOUS | Status: AC
  Administered 2016-01-04: 09:00:00 2 g via INTRAVENOUS
  Filled 2016-01-04: qty 50

## 2016-01-04 MED ORDER — MORPHINE SULFATE ER 15 MG PO TBCR
15.0000 mg | EXTENDED_RELEASE_TABLET | Freq: Two times a day (BID) | ORAL | Status: DC
Start: 2016-01-04 — End: 2016-01-06
  Administered 2016-01-04 – 2016-01-06 (×4): 15 mg via ORAL
  Filled 2016-01-04 (×4): qty 1

## 2016-01-04 NOTE — Progress Notes (Signed)
Patient ID: Robert TRETTEL, male   DOB: [birth year], 70 y.o.   MRN: [id number]   [facility] PROGRESS NOTE  Robert Ball [id number] DOB: 1945/10/24 DOA: 12/30/2015 PCP: Mickey Farber, MD  HPI/Subjective: Patient seen sitting up eating liquid lunch. Patient states his pain in the abdomen is 9 out of 10 intensity. States pain medication did not help very much. As per the wife he has not been feeling good since December. He has not eaten a full meal since June. He is starting to feel hungry.  Objective: Vitals:   01/03/16 2026 01/04/16 0424  BP: 126/64 125/66  Pulse: 72 67  Resp: 18 18  Temp: 97.3 F (36.3 C) 97.7 F (36.5 C)    Filed Weights   12/30/15 1830  Weight: 68.9 kg (152 lb)    ROS: Review of Systems  Constitutional: Negative for chills and fever.  Eyes: Negative for blurred vision.  Respiratory: Negative for cough and shortness of breath.   Cardiovascular: Negative for chest pain.  Gastrointestinal: Positive for abdominal pain. Negative for constipation, diarrhea, nausea and vomiting.  Genitourinary: Negative for dysuria.  Musculoskeletal: Negative for joint pain.  Neurological: Negative for dizziness and headaches.   Exam: Physical Exam  HENT:  Nose: No mucosal edema.  Mouth/Throat: No oropharyngeal exudate or posterior oropharyngeal edema.  Eyes: Conjunctivae, EOM and lids are normal. Pupils are equal, round, and reactive to light.  Neck: No JVD present. Carotid bruit is not present. No edema present. No thyroid mass and no thyromegaly present.  Cardiovascular: Regular rhythm, S1 normal and S2 normal.  Exam reveals no gallop.   No murmur heard. Pulses:      Dorsalis pedis pulses are 2+ on the right side, and 2+ on the left side.  Respiratory: No respiratory distress. He has decreased breath sounds in the right lower field and the left lower field. He has no wheezes. He has no rhonchi. He has no rales.  GI: Soft. Bowel sounds are normal. There is  tenderness in the right upper quadrant and epigastric area.  Musculoskeletal:       Right ankle: He exhibits swelling.       Left ankle: He exhibits swelling.  Lymphadenopathy:    He has no cervical adenopathy.  Neurological: He is alert. No cranial nerve deficit.  Skin: Skin is warm. No rash noted. Nails show no clubbing.  Psychiatric: He has a normal mood and affect.      Data Reviewed: Basic Metabolic Panel:  Recent Labs Lab 12/30/15 1915 12/31/15 0328 01/01/16 0530 01/03/16 0419 01/04/16 0434  NA 132* 134* 135 135 138  K 2.9* 3.2* 3.5 3.1* 3.1*  CL 103 104 108 101 103  CO2 21* 25 18* 18* 26  GLUCOSE 122* 109* 81 178* 142*  BUN 11 11 9 10 11   CREATININE 0.91 1.08 0.94 0.90 0.70  CALCIUM 8.9 8.6* 8.4* 8.1* 8.5*   Liver Function Tests:  Recent Labs Lab 12/30/15 1915 01/01/16 0530 01/04/16 0434  AST 14* 10* 11*  ALT 7* 5* 6*  ALKPHOS 41 33* 33*  BILITOT 1.1 0.6 0.6  PROT 6.3* 5.7* 5.7*  ALBUMIN 3.3* 2.8* 2.8*    Recent Labs Lab 12/31/15 0328 01/01/16 0530 01/02/16 0451 01/03/16 0419 01/04/16 0434  LIPASE 89* 88* 138* 77* 87*    Recent Labs Lab 01/01/16 1925  AMMONIA 9   CBC:  Recent Labs Lab 12/30/15 1915 12/31/15 0328 01/02/16 0451 01/03/16 0419  WBC 9.0 6.6 6.2 4.5  NEUTROABS 7.7*  --   --   --  HGB 13.0 11.6* 10.5* 9.1*  HCT 38.2* 33.3* 30.0* 26.0*  MCV 87.0 87.2 87.1 86.6  PLT 212 173 172 151      Studies: Dg Chest Port 1 View  Result Date: 01/02/2016 CLINICAL DATA:  Status post PICC placement today. EXAM: PORTABLE CHEST 1 VIEW COMPARISON:  Single-view of the chest earlier today. FINDINGS: New right PICC is in place. The tip of the catheter is difficult to visualize due to leads from an AICD. The tip appears to be just above the superior cavoatrial junction. There is cardiomegaly. Lungs are clear. No pneumothorax or pleural effusion. IMPRESSION: Tip of new PICC appears to lie just above the superior cavoatrial junction.  Electronically Signed   By: Drusilla Kannerhomas  Dalessio M.D.   On: 01/02/2016 14:11    Scheduled Meds: . amLODipine  10 mg Oral Daily  . carvedilol  12.5 mg Oral BID WC  . enoxaparin (LOVENOX) injection  40 mg Subcutaneous QHS  . famotidine (PEPCID) IV  20 mg Intravenous Q12H  . furosemide  40 mg Oral Daily  . ipratropium-albuterol  3 mL Nebulization Q6H  . lipase/protease/amylase  36,000 Units Oral TID AC  . methylPREDNISolone (SOLU-MEDROL) injection  40 mg Intravenous Q12H  . mometasone-formoterol  2 puff Inhalation BID  . morphine  15 mg Oral Q12H  . pantoprazole  40 mg Oral QAC breakfast  . potassium chloride  40 mEq Oral Daily  . roflumilast  500 mcg Oral Daily  . sertraline  100 mg Oral BID  . tamsulosin  0.4 mg Oral Daily  . ziprasidone  60 mg Oral QHS    Assessment/Plan:  1. Acute on chronic pancreatitis and epigastric pain. Try to start clear liquid diet today. Start pancreatic enzymes. Start Chronic pain medication long acting. Advance to full liquid diet. 2. COPD exacerbation.  Decrease IV Solu-Medrol to 40 mg IV every 12 hours. Hold off on any antibiotics with recent C. Difficile. 3. Hypokalemia. Replace again today. Check again tomorrow. 4. Essential hypertension continue usual medications 5. Depression continue usual medications 6. Recent history of C. difficile colitis 7. Chronic respiratory failure on 3 L of oxygen 8. Weakness. Did well with physical therapy.  Code Status:     Code Status Orders        Start     Ordered   12/31/15 0039  Full code  Continuous     12/31/15 0039    Code Status History    Date Active Date Inactive Code Status Order ID Comments User Context   12/05/2015  7:29 PM 12/11/2015  8:11 PM Full Code [id number]  Gwendolyn FillBincy S Varughese, NP ED   04/09/2015 10:30 PM 04/11/2015  6:23 PM Full Code [id number]  Houston SirenVivek J Sainani, MD Inpatient   12/27/2014  3:17 PM 12/27/2014  7:47 PM Full Code [id number]  Marcina MillardAlexander Paraschos, MD Inpatient     Family Communication:  Spoke with wife At the bedside Disposition Plan: Unclear at this time  Consultants:  Gastroenterology  Time spent: 24 minutes  Alford HighlandWIETING, Prudie Guthridge  [employer]

## 2016-01-05 LAB — COMPREHENSIVE METABOLIC PANEL
ALBUMIN: 2.9 g/dL — AB (ref 3.5–5.0)
ALT: 8 U/L — ABNORMAL LOW (ref 17–63)
ANION GAP: 4 — AB (ref 5–15)
AST: 18 U/L (ref 15–41)
Alkaline Phosphatase: 40 U/L (ref 38–126)
BILIRUBIN TOTAL: 0.4 mg/dL (ref 0.3–1.2)
BUN: 12 mg/dL (ref 6–20)
CHLORIDE: 103 mmol/L (ref 101–111)
CO2: 28 mmol/L (ref 22–32)
Calcium: 8.4 mg/dL — ABNORMAL LOW (ref 8.9–10.3)
Creatinine, Ser: 0.76 mg/dL (ref 0.61–1.24)
GFR calc Af Amer: >60 mL/min (ref >60)
GFR calc non Af Amer: >60 mL/min (ref >60)
GLUCOSE: 133 mg/dL — AB (ref 65–99)
POTASSIUM: 3.6 mmol/L (ref 3.5–5.1)
SODIUM: 135 mmol/L (ref 135–145)
TOTAL PROTEIN: 5.7 g/dL — AB (ref 6.5–8.1)

## 2016-01-05 LAB — LIPASE, BLOOD: Lipase: 136 U/L — ABNORMAL HIGH (ref 11–51)

## 2016-01-05 MED ORDER — IPRATROPIUM-ALBUTEROL 0.5-2.5 (3) MG/3ML IN SOLN
3.0000 mL | Freq: Three times a day (TID) | RESPIRATORY_TRACT | Status: DC
  Administered 2016-01-05 – 2016-01-10 (×15): 3 mL via RESPIRATORY_TRACT
  Filled 2016-01-05 (×18): qty 3

## 2016-01-05 MED ORDER — PANCRELIPASE (LIP-PROT-AMYL) 12000-38000 UNITS PO CPEP
72000.0000 [IU] | ORAL_CAPSULE | Freq: Three times a day (TID) | ORAL | Status: DC
  Administered 2016-01-05 – 2016-01-08 (×5): 72000 [IU] via ORAL
  Filled 2016-01-05 (×5): qty 6

## 2016-01-05 MED ORDER — PREDNISONE 10 MG PO TABS
30.0000 mg | ORAL_TABLET | Freq: Every day | ORAL | Status: DC
  Administered 2016-01-06: 09:00:00 30 mg via ORAL
  Filled 2016-01-05: qty 1

## 2016-01-05 NOTE — Plan of Care (Signed)
Problem: Nutrition: Goal: Adequate nutrition will be maintained Outcome: Progressing Diet advanced to full liquid on 01/04/16; lipase increased to 136 on 01/05/16

## 2016-01-05 NOTE — Progress Notes (Signed)
Patient ID: Robert Ball, male   DOB: 1946-01-14, 70 y.o.   MRN: [id number]   [facility] PROGRESS NOTE  Robert Ball [id number] DOB: 1945/07/26 DOA: 12/30/2015 PCP: Mickey Farber, MD  HPI/Subjective: Patient last night had some pain after a bowel movement in the right upper quadrant. This morning also had some pain at 6 AM. No relation to eating.  Objective: Vitals:   01/04/16 2055 01/05/16 0525  BP: 118/64 126/65  Pulse: 66 62  Resp: 17 17  Temp: 98 F (36.7 C) 97.8 F (36.6 C)    Filed Weights   12/30/15 1830  Weight: 68.9 kg (152 lb)    ROS: Review of Systems  Constitutional: Negative for chills and fever.  Eyes: Negative for blurred vision.  Respiratory: Negative for cough and shortness of breath.   Cardiovascular: Negative for chest pain.  Gastrointestinal: Positive for abdominal pain. Negative for constipation, diarrhea, nausea and vomiting.  Genitourinary: Negative for dysuria.  Musculoskeletal: Negative for joint pain.  Neurological: Negative for dizziness and headaches.   Exam: Physical Exam  HENT:  Nose: No mucosal edema.  Mouth/Throat: No oropharyngeal exudate or posterior oropharyngeal edema.  Eyes: Conjunctivae, EOM and lids are normal. Pupils are equal, round, and reactive to light.  Neck: No JVD present. Carotid bruit is not present. No edema present. No thyroid mass and no thyromegaly present.  Cardiovascular: Regular rhythm, S1 normal and S2 normal.  Exam reveals no gallop.   No murmur heard. Pulses:      Dorsalis pedis pulses are 2+ on the right side, and 2+ on the left side.  Respiratory: No respiratory distress. He has decreased breath sounds in the right lower field and the left lower field. He has no wheezes. He has no rhonchi. He has no rales.  GI: Soft. Bowel sounds are normal. There is tenderness in the right upper quadrant and epigastric area.  Musculoskeletal:       Right ankle: He exhibits swelling.       Left ankle: He  exhibits swelling.  Lymphadenopathy:    He has no cervical adenopathy.  Neurological: He is alert. No cranial nerve deficit.  Skin: Skin is warm. No rash noted. Nails show no clubbing.  Psychiatric: He has a normal mood and affect.      Data Reviewed: Basic Metabolic Panel:  Recent Labs Lab 12/31/15 0328 01/01/16 0530 01/03/16 0419 01/04/16 0434 01/05/16 0402  NA 134* 135 135 138 135  K 3.2* 3.5 3.1* 3.1* 3.6  CL 104 108 101 103 103  CO2 25 18* 18* 26 28  GLUCOSE 109* 81 178* 142* 133*  BUN 11 9 10 11 12   CREATININE 1.08 0.94 0.90 0.70 0.76  CALCIUM 8.6* 8.4* 8.1* 8.5* 8.4*   Liver Function Tests:  Recent Labs Lab 12/30/15 1915 01/01/16 0530 01/04/16 0434 01/05/16 0402  AST 14* 10* 11* 18  ALT 7* 5* 6* 8*  ALKPHOS 41 33* 33* 40  BILITOT 1.1 0.6 0.6 0.4  PROT 6.3* 5.7* 5.7* 5.7*  ALBUMIN 3.3* 2.8* 2.8* 2.9*    Recent Labs Lab 01/01/16 0530 01/02/16 0451 01/03/16 0419 01/04/16 0434 01/05/16 0402  LIPASE 88* 138* 77* 87* 136*    Recent Labs Lab 01/01/16 1925  AMMONIA 9   CBC:  Recent Labs Lab 12/30/15 1915 12/31/15 0328 01/02/16 0451 01/03/16 0419  WBC 9.0 6.6 6.2 4.5  NEUTROABS 7.7*  --   --   --   HGB 13.0 11.6* 10.5* 9.1*  HCT 38.2* 33.3*  30.0* 26.0*  MCV 87.0 87.2 87.1 86.6  PLT 212 173 172 151     Scheduled Meds: . amLODipine  10 mg Oral Daily  . carvedilol  12.5 mg Oral BID WC  . enoxaparin (LOVENOX) injection  40 mg Subcutaneous QHS  . famotidine (PEPCID) IV  20 mg Intravenous Q12H  . furosemide  40 mg Oral Daily  . ipratropium-albuterol  3 mL Nebulization TID  . lipase/protease/amylase  72,000 Units Oral TID AC  . methylPREDNISolone (SOLU-MEDROL) injection  40 mg Intravenous Q12H  . mometasone-formoterol  2 puff Inhalation BID  . morphine  15 mg Oral Q12H  . pantoprazole  40 mg Oral QAC breakfast  . potassium chloride  40 mEq Oral Daily  . roflumilast  500 mcg Oral Daily  . sertraline  100 mg Oral BID  . tamsulosin  0.4  mg Oral Daily  . ziprasidone  60 mg Oral QHS    Assessment/Plan:  1. Acute on chronic pancreatitis and epigastric pain. Tolerating full liquid diet. I'm hesitant on increasing diet at this point since lipase creeping up. No worsening pain while eating though. Chronic MS Contin 15 mg by mouth twice a day prescribed.  2. COPD exacerbation.  Change over to by mouth prednisone taper for tomorrow. Hold off on any antibiotics with recent C. Difficile. 3. Hypokalemia. Replaced 4. Essential hypertension continue usual medications 5. Depression continue usual medications 6. Recent history of C. difficile colitis 7. Chronic respiratory failure on 3 L of oxygen 8. Weakness. Did well with physical therapy.  Code Status:     Code Status Orders        Start     Ordered   12/31/15 0039  Full code  Continuous     12/31/15 0039    Code Status History    Date Active Date Inactive Code Status Order ID Comments User Context   12/05/2015  7:29 PM 12/11/2015  8:11 PM Full Code [id number]  Gwendolyn FillBincy S Varughese, NP ED   04/09/2015 10:30 PM 04/11/2015  6:23 PM Full Code [id number]  [place] SirenVivek J Sainani, MD Inpatient   12/27/2014  3:17 PM 12/27/2014  7:47 PM Full Code [id number]  Marcina MillardAlexander Paraschos, MD Inpatient     Family Communication: Spoke with wife at the bedside Disposition Plan: Unclear at this time  Consultants:  Gastroenterology  Time spent: 24 minutes  Alford HighlandWIETING, Lee Kuang  [employer]

## 2016-01-06 LAB — BASIC METABOLIC PANEL
ANION GAP: 8 (ref 5–15)
BUN: 13 mg/dL (ref 6–20)
CALCIUM: 8.3 mg/dL — AB (ref 8.9–10.3)
CO2: 25 mmol/L (ref 22–32)
Chloride: 102 mmol/L (ref 101–111)
Creatinine, Ser: 0.73 mg/dL (ref 0.61–1.24)
Glucose, Bld: 128 mg/dL — ABNORMAL HIGH (ref 65–99)
POTASSIUM: 3.4 mmol/L — AB (ref 3.5–5.1)
Sodium: 135 mmol/L (ref 135–145)

## 2016-01-06 LAB — LIPASE, BLOOD: Lipase: 211 U/L — ABNORMAL HIGH (ref 11–51)

## 2016-01-06 LAB — CBC
HCT: 33.7 % — ABNORMAL LOW (ref 40.0–52.0)
HEMOGLOBIN: 11.5 g/dL — AB (ref 13.0–18.0)
MCH: 29.5 pg (ref 26.0–34.0)
MCHC: 34.2 g/dL (ref 32.0–36.0)
MCV: 86.3 fL (ref 80.0–100.0)
PLATELETS: 186 10*3/uL (ref 150–440)
RBC: 3.91 MIL/uL — AB (ref 4.40–5.90)
RDW: 14.2 % (ref 11.5–14.5)
WBC: 7.2 10*3/uL (ref 3.8–10.6)

## 2016-01-06 MED ORDER — STERILE WATER FOR INJECTION IJ SOLN
INTRAMUSCULAR | Status: AC
  Filled 2016-01-06: qty 10

## 2016-01-06 MED ORDER — HEPARIN SOD (PORK) LOCK FLUSH 100 UNIT/ML IV SOLN
250.0000 [IU] | INTRAVENOUS | Status: DC | PRN
  Filled 2016-01-06: qty 5

## 2016-01-06 MED ORDER — SODIUM CHLORIDE 0.9 % IV SOLN
INTRAVENOUS | Status: DC
  Administered 2016-01-06: 09:00:00 via INTRAVENOUS

## 2016-01-06 MED ORDER — SODIUM CHLORIDE 0.9% FLUSH: 10.0000 mL | INTRAVENOUS | Status: DC | PRN

## 2016-01-06 MED ORDER — ALTEPLASE 2 MG IJ SOLR
2.0000 mg | Freq: Once | INTRAMUSCULAR | Status: AC | PRN
  Administered 2016-01-06: 2 mg
  Filled 2016-01-06 (×2): qty 2

## 2016-01-06 MED ORDER — TRACE MINERALS CR-CU-MN-SE-ZN 10-1000-500-60 MCG/ML IV SOLN
INTRAVENOUS | Status: AC
  Administered 2016-01-06: 17:00:00 via INTRAVENOUS
  Filled 2016-01-06: qty 600

## 2016-01-06 MED ORDER — FAMOTIDINE 20 MG PO TABS
20.0000 mg | ORAL_TABLET | Freq: Two times a day (BID) | ORAL | Status: DC
  Administered 2016-01-06 – 2016-01-10 (×6): 20 mg via ORAL
  Filled 2016-01-06 (×8): qty 1

## 2016-01-06 MED ORDER — PREDNISONE 20 MG PO TABS
20.0000 mg | ORAL_TABLET | Freq: Every day | ORAL | Status: DC
  Administered 2016-01-07: 20 mg via ORAL
  Filled 2016-01-06: qty 1

## 2016-01-06 NOTE — Progress Notes (Addendum)
Nutrition Follow-up  DOCUMENTATION CODES:   Severe malnutrition in context of acute illness/injury  INTERVENTION:  -Pt with limited nutrition > 7 days and severe malnutrition.  MD consulted for TPN recommendations. Recommend 5%AA/20% dextrose with electrolytes, MVI and trace elements at goal rate of 9875ml/hr with 20% lipids 3 times per week once tolerating TPN at goal rate. Provides 2012 kcals, 90 g of protein, 1800ml fluid with additional 500ml 3 times per week when lipids given.   Recommend starting TPN at 5625ml/hr today.  Will reassess titration of TPN in am pending labs and pt status. Do not recommend starting lipids until pt tolerating TPN at goal rate. Pharmacy consulted.    NUTRITION DIAGNOSIS:   Malnutrition related to acute illness as evidenced by percent weight loss, energy intake < or equal to 50% for > or equal to 5 days.    GOAL:   Patient will meet greater than or equal to 90% of their needs    MONITOR:   Diet advancement, Weight trends, Labs  REASON FOR ASSESSMENT:   Consult New TPN/TNA  ASSESSMENT:     Pt with continued abdominal pain, lipase increasing per MD note  Limited nutrition day 7 of admission  Pt with right brachial PICC line  Medications reviewed: lasix, creon, protonix, KCL, NS at 7330ml/hr Labs reviewed: K 3.4, glucose 128, lipase 211   Diet Order:  Diet NPO time specified Except for: Sips with Meds  Skin:  Reviewed, no issues  Last BM:  9/2  Height:   Ht Readings from Last 1 Encounters:  12/30/15 5\' 5"  (1.651 m)    Weight:   Wt Readings from Last 1 Encounters:  12/30/15 152 lb (68.9 kg)    Ideal Body Weight:     BMI:  Body mass index is 25.29 kg/m.  Estimated Nutritional Needs:   Kcal:  2040-2380 kcals/d  Protein:  82-102 g/d  Fluid:  >/=2 L  EDUCATION NEEDS:   No education needs identified at this time  Katora Fini B. [name], RD, LDN [phone] (pager) Weekend/On-Call pager [phone])

## 2016-01-06 NOTE — Progress Notes (Signed)
Patient ID: Robert Ball, male   DOB: Mar 04, 1946, 70 y.o.   MRN: [id number]   [facility] PROGRESS NOTE  ZORIAN GUNDERMAN WUJ:[id number] DOB: 1945-06-21 DOA: 12/30/2015 PCP: Mickey Farber, MD  HPI/Subjective: Patient's lipase up this morning. Patient had pain last night that was severe. Also nauseous and vomiting last night. Pain this morning also severe. He is more lethargic this morning. States his breathing is better.  Objective: Vitals:   01/05/16 2106 01/06/16 0433  BP: (!) 142/63 (!) 158/75  Pulse: 64 74  Resp:    Temp: 98 F (36.7 C) 98.4 F (36.9 C)    Filed Weights   12/30/15 1830  Weight: 68.9 kg (152 lb)    ROS: Review of Systems  Constitutional: Negative for chills and fever.  Eyes: Negative for blurred vision.  Respiratory: Negative for cough and shortness of breath.   Cardiovascular: Negative for chest pain.  Gastrointestinal: Positive for abdominal pain. Negative for constipation, diarrhea, nausea and vomiting.  Genitourinary: Negative for dysuria.  Musculoskeletal: Negative for joint pain.  Neurological: Negative for dizziness and headaches.   Exam: Physical Exam  Constitutional: He appears lethargic.  HENT:  Nose: No mucosal edema.  Mouth/Throat: No oropharyngeal exudate or posterior oropharyngeal edema.  Eyes: Conjunctivae, EOM and lids are normal. Pupils are equal, round, and reactive to light.  Neck: No JVD present. Carotid bruit is not present. No edema present. No thyroid mass and no thyromegaly present.  Cardiovascular: Regular rhythm, S1 normal and S2 normal.  Exam reveals no gallop.   No murmur heard. Pulses:      Dorsalis pedis pulses are 2+ on the right side, and 2+ on the left side.  Respiratory: No respiratory distress. He has decreased breath sounds in the right lower field and the left lower field. He has no wheezes. He has no rhonchi. He has no rales.  GI: Soft. Bowel sounds are normal. There is tenderness in the right upper  quadrant and epigastric area.  Musculoskeletal:       Right ankle: He exhibits swelling.       Left ankle: He exhibits swelling.  Lymphadenopathy:    He has no cervical adenopathy.  Neurological: He appears lethargic.  Skin: Skin is warm. No rash noted. Nails show no clubbing.  Psychiatric: He has a normal mood and affect.      Data Reviewed: Basic Metabolic Panel:  Recent Labs Lab 01/01/16 0530 01/03/16 0419 01/04/16 0434 01/05/16 0402 01/06/16 0520  NA 135 135 138 135 135  K 3.5 3.1* 3.1* 3.6 3.4*  CL 108 101 103 103 102  CO2 18* 18* 26 28 25   GLUCOSE 81 178* 142* 133* 128*  BUN 9 10 11 12 13   CREATININE 0.94 0.90 0.70 0.76 0.73  CALCIUM 8.4* 8.1* 8.5* 8.4* 8.3*   Liver Function Tests:  Recent Labs Lab 12/30/15 1915 01/01/16 0530 01/04/16 0434 01/05/16 0402  AST 14* 10* 11* 18  ALT 7* 5* 6* 8*  ALKPHOS 41 33* 33* 40  BILITOT 1.1 0.6 0.6 0.4  PROT 6.3* 5.7* 5.7* 5.7*  ALBUMIN 3.3* 2.8* 2.8* 2.9*    Recent Labs Lab 01/02/16 0451 01/03/16 0419 01/04/16 0434 01/05/16 0402 01/06/16 0520  LIPASE 138* 77* 87* 136* 211*    Recent Labs Lab 01/01/16 1925  AMMONIA 9   CBC:  Recent Labs Lab 12/30/15 1915 12/31/15 0328 01/02/16 0451 01/03/16 0419 01/06/16 0520  WBC 9.0 6.6 6.2 4.5 7.2  NEUTROABS 7.7*  --   --   --   --  HGB 13.0 11.6* 10.5* 9.1* 11.5*  HCT 38.2* 33.3* 30.0* 26.0* 33.7*  MCV 87.0 87.2 87.1 86.6 86.3  PLT 212 173 172 151 186     Scheduled Meds: . amLODipine  10 mg Oral Daily  . carvedilol  12.5 mg Oral BID WC  . enoxaparin (LOVENOX) injection  40 mg Subcutaneous QHS  . famotidine (PEPCID) IV  20 mg Intravenous Q12H  . furosemide  40 mg Oral Daily  . ipratropium-albuterol  3 mL Nebulization TID  . lipase/protease/amylase  72,000 Units Oral TID AC  . mometasone-formoterol  2 puff Inhalation BID  . pantoprazole  40 mg Oral QAC breakfast  . potassium chloride  40 mEq Oral Daily  . [START ON 01/07/2016] predniSONE  20 mg Oral Q  breakfast  . roflumilast  500 mcg Oral Daily  . sertraline  100 mg Oral BID  . tamsulosin  0.4 mg Oral Daily  . ziprasidone  60 mg Oral QHS    Assessment/Plan:  1. Acute on chronic pancreatitis and epigastric pain. Since lipase increased today and patient had worsening pain last night with nausea and vomiting, I will switch back to nothing by mouth. Recheck labs tomorrow. Start TPN. Restart IV fluids. I'm very concerned about the patient's slow progress. 2. Increased lethargy with long-acting pain medications. Discontinue MS Contin. Use only short acting medications.  3. COPD exacerbation.  Decrease prednisone down to 20 mg daily starting tomorrow. Continue taper. Hold off on any antibiotics with recent C. Difficile. 4. Hypokalemia. Replace daily. 5. Essential hypertension continue usual medications 6. Depression continue usual medications 7. Recent history of C. difficile colitis 8. Chronic respiratory failure on 3 L of oxygen 9. Weakness. Did well with physical therapy.  Code Status:     Code Status Orders        Start     Ordered   12/31/15 0039  Full code  Continuous     12/31/15 0039    Code Status History    Date Active Date Inactive Code Status Order ID Comments User Context   12/05/2015  7:29 PM 12/11/2015  8:11 PM Full Code [id number]  Gwendolyn FillBincy S Varughese, NP ED   04/09/2015 10:30 PM 04/11/2015  6:23 PM Full Code [id number]  Houston SirenVivek J Sainani, MD Inpatient   12/27/2014  3:17 PM 12/27/2014  7:47 PM Full Code [id number]  Marcina MillardAlexander Paraschos, MD Inpatient     Family Communication: Spoke with wife at the bedside Disposition Plan: Unclear at this time.   Consultants:  Gastroenterology  Time spent: 25 minutes  Alford HighlandWIETING, Paxton Binns  [employer]

## 2016-01-06 NOTE — Care Management Important Message (Signed)
Important Message  Patient Details  Name: Robert Ball MRN: [id number] Date of Birth: 06/30/1945   Medicare Important Message Given:  Yes    Gwenette GreetBrenda S Zea Kostka, RN 01/06/2016, 8:27 AM

## 2016-01-06 NOTE — Progress Notes (Signed)
Noted with purple line of PICC clotted, Dr. Hilton Sinclairweiting notified ordered TPA, attempted TPA declotting at 1443 per policy unable to declot line. Dr. Hilton SinclairWeiting notified at 1630 that line was unable to declot, MD ordered to change pepcid to po and use only the one line for TPN starting at 1800 per orders.

## 2016-01-07 LAB — BASIC METABOLIC PANEL
Anion gap: 8 (ref 5–15)
BUN: 20 mg/dL (ref 6–20)
CALCIUM: 8.6 mg/dL — AB (ref 8.9–10.3)
CO2: 25 mmol/L (ref 22–32)
CREATININE: 0.96 mg/dL (ref 0.61–1.24)
Chloride: 101 mmol/L (ref 101–111)
GFR calc Af Amer: >60 mL/min (ref >60)
Glucose, Bld: 230 mg/dL — ABNORMAL HIGH (ref 65–99)
Potassium: 3.8 mmol/L (ref 3.5–5.1)
SODIUM: 134 mmol/L — AB (ref 135–145)

## 2016-01-07 LAB — GLUCOSE, CAPILLARY
GLUCOSE-CAPILLARY: 223 mg/dL — AB (ref 65–99)
Glucose-Capillary: 226 mg/dL — ABNORMAL HIGH (ref 65–99)

## 2016-01-07 LAB — MAGNESIUM: MAGNESIUM: 1.9 mg/dL (ref 1.7–2.4)

## 2016-01-07 LAB — LIPASE, BLOOD: Lipase: 143 U/L — ABNORMAL HIGH (ref 11–51)

## 2016-01-07 LAB — PHOSPHORUS: Phosphorus: 2.7 mg/dL (ref 2.5–4.6)

## 2016-01-07 MED ORDER — BARIUM SULFATE 2.1 % PO SUSP
450.0000 mL | ORAL | Status: AC
  Administered 2016-01-07: 450 mL via ORAL

## 2016-01-07 MED ORDER — PREDNISONE 10 MG PO TABS
10.0000 mg | ORAL_TABLET | Freq: Every day | ORAL | Status: DC
  Administered 2016-01-08: 10 mg via ORAL
  Filled 2016-01-07: qty 1

## 2016-01-07 MED ORDER — INSULIN ASPART 100 UNIT/ML ~~LOC~~ SOLN
0.0000 [IU] | Freq: Four times a day (QID) | SUBCUTANEOUS | Status: DC
  Administered 2016-01-07 – 2016-01-08 (×4): 5 [IU] via SUBCUTANEOUS
  Filled 2016-01-07 (×3): qty 5

## 2016-01-07 MED ORDER — NYSTATIN 100000 UNIT/ML MT SUSP
5.0000 mL | Freq: Four times a day (QID) | OROMUCOSAL | Status: DC
  Administered 2016-01-07 – 2016-01-09 (×8): 500000 [IU] via ORAL
  Filled 2016-01-07 (×10): qty 5

## 2016-01-07 MED ORDER — INSULIN ASPART 100 UNIT/ML ~~LOC~~ SOLN
0.0000 [IU] | Freq: Three times a day (TID) | SUBCUTANEOUS | Status: DC
  Filled 2016-01-07: qty 3

## 2016-01-07 MED ORDER — INSULIN ASPART 100 UNIT/ML ~~LOC~~ SOLN: 0.0000 [IU] | Freq: Every day | SUBCUTANEOUS | Status: DC

## 2016-01-07 MED ORDER — M.V.I. ADULT IV INJ
INJECTION | INTRAVENOUS | Status: AC
  Administered 2016-01-07: 18:00:00 via INTRAVENOUS
  Filled 2016-01-07: qty 1200

## 2016-01-07 MED ORDER — INSULIN ASPART 100 UNIT/ML ~~LOC~~ SOLN: 0.0000 [IU] | Freq: Four times a day (QID) | SUBCUTANEOUS | Status: DC

## 2016-01-07 NOTE — Progress Notes (Signed)
Patient ID: Robert AgarBilly R Ball, male   DOB: January 28, 1946, 70 y.o.   MRN: [id number]  Case discussed with Dr. Marva PandaSkulskie gastroenterology. Repeat CT scan with oral contrast only. He agreed with TPN and nothing by mouth at this time. I left a message for wife on phone.  Dr. Alford Highlandichard Nethra Mehlberg

## 2016-01-07 NOTE — Consult Note (Signed)
Subjective: Patient seen for acute on chronic pancreatitis.  Difficult to assess due to poor patient responses to questions.  He answers at times and not others.  Passing flatus today, no emesis, continues with epigastric pain and nausea.    Objective: Vital signs in last 24 hours: Temp:  [97.6 F (36.4 C)-98.2 F (36.8 C)] 97.7 F (36.5 C) (09/05 1358) Pulse Rate:  [72-108] 108 (09/05 1358) Resp:  [17-20] 20 (09/05 1358) BP: (110-159)/(73-98) 159/98 (09/05 1358) SpO2:  [97 %-100 %] 100 % (09/05 1358) Blood pressure (!) 159/98, pulse (!) 108, temperature 97.7 F (36.5 C), temperature source Oral, resp. rate 20, height 5\' 5"  (1.651 m), weight 68.9 kg (152 lb), SpO2 100 %.   Intake/Output from previous day: 09/04 0701 - 09/05 0700 In: 627.6 [I.V.:527.6; IV Piggyback:100] Out: 975 [Urine:975]  Intake/Output this shift: Total I/O In: 250 [I.V.:250] Out: -    General appearance:  [id number] male no acute distress.  Resp:  cta Cardio:  rrr GI:  Protuberant, soft, non distended, mile epigastric discomfort to palpation, positive bowel sounds, no masses or rebound.  Extremities:  No cce   Lab Results: Results for orders placed or performed during the hospital encounter of 12/30/15 (from the past 24 hour(s))  Basic metabolic panel     Status: Abnormal   Collection Time: 01/07/16  8:30 AM  Result Value Ref Range   Sodium 134 (L) 135 - 145 mmol/L   Potassium 3.8 3.5 - 5.1 mmol/L   Chloride 101 101 - 111 mmol/L   CO2 25 22 - 32 mmol/L   Glucose, Bld 230 (H) 65 - 99 mg/dL   BUN 20 6 - 20 mg/dL   Creatinine, Ser 1.610.96 0.61 - 1.24 mg/dL   Calcium 8.6 (L) 8.9 - 10.3 mg/dL   GFR calc non Af Amer >60 >60 mL/min   GFR calc Af Amer >60 >60 mL/min   Anion gap 8 5 - 15  Magnesium     Status: None   Collection Time: 01/07/16  8:30 AM  Result Value Ref Range   Magnesium 1.9 1.7 - 2.4 mg/dL  Phosphorus     Status: None   Collection Time: 01/07/16  8:30 AM  Result Value Ref Range   Phosphorus  2.7 2.5 - 4.6 mg/dL  Lipase, blood     Status: Abnormal   Collection Time: 01/07/16  8:30 AM  Result Value Ref Range   Lipase 143 (H) 11 - 51 U/L  Glucose, capillary     Status: Abnormal   Collection Time: 01/07/16  9:41 AM  Result Value Ref Range   Glucose-Capillary 226 (H) 65 - 99 mg/dL  Glucose, capillary     Status: Abnormal   Collection Time: 01/07/16  4:55 PM  Result Value Ref Range   Glucose-Capillary 223 (H) 65 - 99 mg/dL      Recent Labs  09/60/4507/08/19 0520  WBC 7.2  HGB 11.5*  HCT 33.7*  PLT 186   BMET  Recent Labs  01/05/16 0402 01/06/16 0520 01/07/16 0830  NA 135 135 134*  K 3.6 3.4* 3.8  CL 103 102 101  CO2 28 25 25   GLUCOSE 133* 128* 230*  BUN 12 13 20   CREATININE 0.76 0.73 0.96  CALCIUM 8.4* 8.3* 8.6*   LFT  Recent Labs  01/05/16 0402  PROT 5.7*  ALBUMIN 2.9*  AST 18  ALT 8*  ALKPHOS 40  BILITOT 0.4   PT/INR No results for input(s): LABPROT, INR in the last 72  hours. Hepatitis Panel No results for input(s): HEPBSAG, HCVAB, HEPAIGM, HEPBIGM in the last 72 hours. C-Diff No results for input(s): CDIFFTOX in the last 72 hours. No results for input(s): CDIFFPCR in the last 72 hours.   Studies/Results: No results found.  Scheduled Inpatient Medications:   . amLODipine  10 mg Oral Daily  . carvedilol  12.5 mg Oral BID WC  . enoxaparin (LOVENOX) injection  40 mg Subcutaneous QHS  . famotidine  20 mg Oral BID  . furosemide  40 mg Oral Daily  . insulin aspart  0-15 Units Subcutaneous Q6H  . ipratropium-albuterol  3 mL Nebulization TID  . lipase/protease/amylase  72,000 Units Oral TID AC  . mometasone-formoterol  2 puff Inhalation BID  . nystatin  5 mL Oral QID  . pantoprazole  40 mg Oral QAC breakfast  . potassium chloride  40 mEq Oral Daily  . [START ON 01/08/2016] predniSONE  10 mg Oral Q breakfast  . roflumilast  500 mcg Oral Daily  . sertraline  100 mg Oral BID  . tamsulosin  0.4 mg Oral Daily  . ziprasidone  60 mg Oral QHS     Continuous Inpatient Infusions:   . Marland KitchenTPN (CLINIMIX-E) Adult      PRN Inpatient Medications:  acetaminophen **OR** acetaminophen, albuterol, heparin lock flush, HYDROcodone-acetaminophen, morphine injection, ondansetron **OR** ondansetron (ZOFRAN) IV, sodium chloride flush  Miscellaneous:   Assessment:  1) acute on chronic pancreatitis.  Exacerbation of sx with diet advancement over the weekend. Not able to image with MRI due to pacemaker, unable to repeat iv contrasted CT due to iv contrast reaction, did not tolerate po contrast earlier today.    Plan:  1) hopefully he will tolerate contrast po tomorrow. Discussed with Dr Hilton Sinclair, agree with NPO and TPN.  When liquids are reintroduced, would try to hold off until pain is minimal before restarting.  Very slow advancement.     I will not be available from Thursday until Monday.    Christena Deem MD 01/07/2016, 6:08 PM

## 2016-01-07 NOTE — Progress Notes (Signed)
Physical Therapy Treatment Patient Details Name: Robert Ball MRN: [id number] DOB: 05-28-1945 Today's Date: 01/07/2016    History of Present Illness Pt is a 70 y.o. male presenting to hospital with rectal bleeding and abdominal pain.  Pt admitted with acute on chronic pancreatitis with chronic CHF.  Of note pt with recent admission earlier this month with abdominal pain and pancreatitis.  PMH includes end stage COPD, htn, anxiety, PTSD, htn, and 3 L continuous O2 at home.    PT Comments    Pt agreeable to PT, as he wishes up in the chair. Reports abdominal pain 9/10 with emesis today while trying to drink contrast for upcoming CT scan. Pt requires Mod A for bed mobility and Min A x 2 for transfers/ambulation with increased cueing, encouragement and time required for all activity. Pt comfortable up in chair and wishes no further PT treatment at this time. Continue PT to progress strength and endurance to improve all functional mobility.   Follow Up Recommendations  Home health PT;Supervision for mobility/OOB;Other (comment) (versus SNF if ambulation not improved)     Equipment Recommendations  None recommended by PT    Recommendations for Other Services       Precautions / Restrictions Restrictions Weight Bearing Restrictions: No    Mobility  Bed Mobility Overal bed mobility: Needs Assistance Bed Mobility: Supine to Sit     Supine to sit: Mod assist     General bed mobility comments: Assist for LEs and trunk elevation. Mild SOB with rest seated at EOB before attempting transfer  Transfers Overall transfer level: Needs assistance Equipment used: 2 person hand held assist Transfers: Sit to/from Stand Sit to Stand: Min assist         General transfer comment: Slow to rise with 2 person assist and use of posterior legs against bed. No buckling or LOB. Does require static stand before ready to attempt steps  Ambulation/Gait Ambulation/Gait assistance: Min assist;+2  physical assistance;+2 safety/equipment Ambulation Distance (Feet): 3 Feet Assistive device: 2 person hand held assist Gait Pattern/deviations: Step-to pattern;Shuffle (side shuffle with very small steps) Gait velocity: decreased Gait velocity interpretation: <1.8 ft/sec, indicative of risk for recurrent falls General Gait Details: Requires increased verbal cues and encouragement to take small steps toward chair. shuffles feet versus clearing feet from floor   Stairs            Wheelchair Mobility    Modified Rankin (Stroke Patients Only)       Balance                                    Cognition Arousal/Alertness: Awake/alert Behavior During Therapy: Flat affect Overall Cognitive Status: Within Functional Limits for tasks assessed                      Exercises      General Comments        Pertinent Vitals/Pain Pain Assessment: 0-10 Pain Score: 9  Pain Location: Abdomen/stomach Pain Descriptors / Indicators: Constant;Sharp (nauseated/emesis) Pain Intervention(s): Limited activity within patient's tolerance    Home Living                      Prior Function            PT Goals (current goals can now be found in the care plan section) Progress towards PT goals: Progressing toward goals (slowly)  Frequency  Min 2X/week    PT Plan Current plan remains appropriate    Co-evaluation             End of Session Equipment Utilized During Treatment: Gait belt;Oxygen Activity Tolerance: Patient limited by fatigue;Patient limited by pain;Other (comment) (limited by emesis) Patient left: in chair;with call bell/phone within reach;with chair alarm set;with family/visitor present     Time: [id number] PT Time Calculation (min) (ACUTE ONLY): 16 min  Charges:  $Therapeutic Activity: 8-22 mins                    G CodesKristeen Miss:      Robert Ball, PTA 01/07/2016, 2:07 PM

## 2016-01-07 NOTE — Consult Note (Signed)
PARENTERAL NUTRITION CONSULT NOTE - INITIAL  Pharmacy Consult for electrolytes/glucose Indication: TPN  Allergies  Allergen Reactions  . Contrast Media [Iodinated Diagnostic Agents] Anaphylaxis    Angioedema  . Aspirin Other (See Comments) and Itching    Pt is unable to take this medication.    . Ciprofloxacin Hcl Other (See Comments)    Reaction:  Unknown   . Ibuprofen Other (See Comments) and Itching    Pt is unable to take this medication.    . Metronidazole Other (See Comments)    Reaction:  Unknown   . Naproxen Other (See Comments)    Pt is unable to take this medication.    . Oxycodone Itching  . Tetracyclines & Related Other (See Comments)    Reaction:  Unknown   . Ciprofloxacin Rash  . Nitrofurantoin Rash  . Sulfa Antibiotics Rash  . Tetracycline Rash    Patient Measurements: Height: 5\' 5"  (165.1 cm) Weight: 152 lb (68.9 kg) IBW/kg (Calculated) : 61.5 Adjusted Body Weight:  Usual Weight:   Vital Signs: Temp: 98.2 F (36.8 C) (09/05 0843) Temp Source: Oral (09/05 0843) BP: 153/73 (09/05 0843) Pulse Rate: 90 (09/05 0843) Intake/Output from previous day: 09/04 0701 - 09/05 0700 In: 627.6 [I.V.:527.6; IV Piggyback:100] Out: 975 [Urine:975] Intake/Output from this shift: No intake/output data recorded.  Labs:  Recent Labs  01/06/16 0520  WBC 7.2  HGB 11.5*  HCT 33.7*  PLT 186     Recent Labs  01/05/16 0402 01/06/16 0520 01/07/16 0830  NA 135 135 134*  K 3.6 3.4* 3.8  CL 103 102 101  CO2 28 25 25   GLUCOSE 133* 128* 230*  BUN 12 13 20   CREATININE 0.76 0.73 0.96  CALCIUM 8.4* 8.3* 8.6*  MG  --   --  1.9  PHOS  --   --  2.7  PROT 5.7*  --   --   ALBUMIN 2.9*  --   --   AST 18  --   --   ALT 8*  --   --   ALKPHOS 40  --   --   BILITOT 0.4  --   --    Estimated Creatinine Clearance: 60.5 mL/min (by C-G formula based on SCr of 0.96 mg/dL).    Recent Labs  01/07/16 0941  GLUCAP 226*    Medical History: Past Medical History:   Diagnosis Date  . Anxiety   . Cardiac defibrillator in place   . COPD (chronic obstructive pulmonary disease) (HCC)    On 2liter oxygen  . Depression   . Elevated lipids   . GERD (gastroesophageal reflux disease)   . Headache   . Hypertension   . Prostate enlargement   . PTSD (post-traumatic stress disorder)   . Shakes     Medications:  Scheduled:  . amLODipine  10 mg Oral Daily  . carvedilol  12.5 mg Oral BID WC  . enoxaparin (LOVENOX) injection  40 mg Subcutaneous QHS  . famotidine  20 mg Oral BID  . furosemide  40 mg Oral Daily  . insulin aspart  0-15 Units Subcutaneous Q6H  . ipratropium-albuterol  3 mL Nebulization TID  . lipase/protease/amylase  72,000 Units Oral TID AC  . mometasone-formoterol  2 puff Inhalation BID  . nystatin  5 mL Oral QID  . pantoprazole  40 mg Oral QAC breakfast  . potassium chloride  40 mEq Oral Daily  . [START ON 01/08/2016] predniSONE  10 mg Oral Q breakfast  . roflumilast  500 mcg Oral Daily  . sertraline  100 mg Oral BID  . tamsulosin  0.4 mg Oral Daily  . ziprasidone  60 mg Oral QHS    Insulin Requirements in the past 24 hours:  0 (none had been ordered)  Current Nutrition:  Clinimix E 5/20 @ 65ml/hr  Assessment: Electrolytes are WNL. BG elevated. CBG ordered q 6 hours plus mod SSI q 6 hours   Plan:  Increase rate to 47ml/hr. Continue to monitor BG and electrolytes  Melissa D Maccia 01/07/2016,11:55 AM

## 2016-01-07 NOTE — Progress Notes (Signed)
Patient ID: Robert Ball, male   DOB: 03-Nov-1945, 70 y.o.   MRN: [id number]   [facility] PROGRESS NOTE  Robert AgarBilly R Ball WUJ:[id number] DOB: 03-Nov-1945 DOA: 12/30/2015 PCP: Mickey FarberHIES, DAVID, MD  HPI/Subjective: Patient did not sleep very well last night. Lethargic. Slow with his answers. States he has abdominal pain but cannot tell me how bad.  Objective: Vitals:   01/07/16 0435 01/07/16 0843  BP: 110/76 (!) 153/73  Pulse: 89 90  Resp: 18 18  Temp: 97.6 F (36.4 C) 98.2 F (36.8 C)    Filed Weights   12/30/15 1830  Weight: 68.9 kg (152 lb)    ROS: Review of Systems  Unable to perform ROS: Acuity of condition  Gastrointestinal: Positive for abdominal pain.   Exam: Physical Exam  Constitutional: He appears lethargic.  HENT:  Nose: No mucosal edema.  Mouth/Throat: No oropharyngeal exudate or posterior oropharyngeal edema.  Thrush seen in the mouth.  Eyes: Conjunctivae and lids are normal. Pupils are equal, round, and reactive to light.  Neck: No JVD present. Carotid bruit is not present. No edema present. No thyroid mass and no thyromegaly present.  Cardiovascular: Regular rhythm, S1 normal and S2 normal.  Exam reveals no gallop.   No murmur heard. Pulses:      Dorsalis pedis pulses are 2+ on the right side, and 2+ on the left side.  Respiratory: No respiratory distress. He has decreased breath sounds in the right lower field and the left lower field. He has no wheezes. He has no rhonchi. He has no rales.  GI: Soft. Bowel sounds are normal. There is tenderness in the right upper quadrant and epigastric area.  Musculoskeletal:       Right ankle: He exhibits swelling.       Left ankle: He exhibits swelling.  Lymphadenopathy:    He has no cervical adenopathy.  Neurological: He appears lethargic.  Skin: Skin is warm. No rash noted. Nails show no clubbing.  Psychiatric: His speech is delayed.      Data Reviewed: Basic Metabolic Panel:  Recent Labs Lab  01/03/16 0419 01/04/16 0434 01/05/16 0402 01/06/16 0520 01/07/16 0830  NA 135 138 135 135 134*  K 3.1* 3.1* 3.6 3.4* 3.8  CL 101 103 103 102 101  CO2 18* 26 28 25 25   GLUCOSE 178* 142* 133* 128* 230*  BUN 10 11 12 13 20   CREATININE 0.90 0.70 0.76 0.73 0.96  CALCIUM 8.1* 8.5* 8.4* 8.3* 8.6*  MG  --   --   --   --  1.9  PHOS  --   --   --   --  2.7   Liver Function Tests:  Recent Labs Lab 01/01/16 0530 01/04/16 0434 01/05/16 0402  AST 10* 11* 18  ALT 5* 6* 8*  ALKPHOS 33* 33* 40  BILITOT 0.6 0.6 0.4  PROT 5.7* 5.7* 5.7*  ALBUMIN 2.8* 2.8* 2.9*    Recent Labs Lab 01/03/16 0419 01/04/16 0434 01/05/16 0402 01/06/16 0520 01/07/16 0830  LIPASE 77* 87* 136* 211* 143*    Recent Labs Lab 01/01/16 1925  AMMONIA 9   CBC:  Recent Labs Lab 01/02/16 0451 01/03/16 0419 01/06/16 0520  WBC 6.2 4.5 7.2  HGB 10.5* 9.1* 11.5*  HCT 30.0* 26.0* 33.7*  MCV 87.1 86.6 86.3  PLT 172 151 186     Scheduled Meds: . amLODipine  10 mg Oral Daily  . carvedilol  12.5 mg Oral BID WC  . enoxaparin (LOVENOX) injection  40  mg Subcutaneous QHS  . famotidine  20 mg Oral BID  . furosemide  40 mg Oral Daily  . insulin aspart  0-9 Units Subcutaneous Q6H  . ipratropium-albuterol  3 mL Nebulization TID  . lipase/protease/amylase  72,000 Units Oral TID AC  . mometasone-formoterol  2 puff Inhalation BID  . nystatin  5 mL Oral QID  . pantoprazole  40 mg Oral QAC breakfast  . potassium chloride  40 mEq Oral Daily  . predniSONE  20 mg Oral Q breakfast  . roflumilast  500 mcg Oral Daily  . sertraline  100 mg Oral BID  . tamsulosin  0.4 mg Oral Daily  . ziprasidone  60 mg Oral QHS    Assessment/Plan:  1. Acute on chronic pancreatitis and epigastric pain. I made the patient nothing by mouth yesterday with increasing lipase and pain. Today's lipase is down from yesterday at 143 but I will continue to keep nothing by mouth.  Started TPN yesterday.  I'm very concerned about the patient's  slow progress. Tried to page Dr. Marva Panda gastroenterology about reconsult. 2. Increased lethargy with long-acting pain medications and patient did not sleep last night.. Use only short acting pain medications. Geodon at night. 3. COPD exacerbation.  Decrease prednisone down to 10 mg daily starting tomorrow. Continue taper. Hold off on any antibiotics with recent C. Difficile. 4. Hypokalemia. Replace daily. 5. Hypomagnesemia replace IV 6. Essential hypertension continue usual medications 7. Depression continue usual medications 8. Recent history of C. difficile colitis 9. Chronic respiratory failure on 3 L of oxygen 10. Weakness. Continue physical therapy evaluation 11. Thrush. Nystatin swish and swallow 12. Since her sugar up with chemistry this morning I'll put on sliding scale insulin.  Code Status:     Code Status Orders        Start     Ordered   12/31/15 0039  Full code  Continuous     12/31/15 0039    Code Status History    Date Active Date Inactive Code Status Order ID Comments User Context   12/05/2015  7:29 PM 12/11/2015  8:11 PM Full Code [id number]  Gwendolyn Fill, NP ED   04/09/2015 10:30 PM 04/11/2015  6:23 PM Full Code [id number]  Houston Siren, MD Inpatient   12/27/2014  3:17 PM 12/27/2014  7:47 PM Full Code [id number]  Marcina Millard, MD Inpatient     Family Communication: Spoke with wife at the bedside Disposition Plan: Unclear at this time.   Consultants:  Gastroenterology  Time spent: 26 minutes  Alford Highland  [employer]

## 2016-01-07 NOTE — Progress Notes (Signed)
Nutrition Follow-up  DOCUMENTATION CODES:   Severe malnutrition in context of acute illness/injury  INTERVENTION:  -Will ask nursing to obtain accurate wt.  Last wt taken on 8/28. -Recommend increasing TPN of 5%AA/20% dextrose with electrolytes, MVI, trace elements to 2350ml/hr today (goal rate 5475ml/hr).  Recommend continuing to check BMP, Mag and Phos at this time as pt at risk of refeeding syndrome with limited nutrition > 7 days and meeting criteria for severe malnutrition.  Pharmacy following as well.    NUTRITION DIAGNOSIS:   Malnutrition related to acute illness as evidenced by percent weight loss, energy intake < or equal to 50% for > or equal to 5 days.    GOAL:   Patient will meet greater than or equal to 90% of their needs    MONITOR:   Diet advancement, Weight trends, Labs  REASON FOR ASSESSMENT:   Consult New TPN/TNA  ASSESSMENT:     TPN of 5% AA/20% dextrose at 3825ml/hr via PICC line  Medications reviewed: lasix, aspart, creon, nystatin Labs reviewed: Na 134, glucose 230, FSBS 226, Mg, K, and Phos WDL Lipase 143  UOP: 975ml last 24 hr  Noted mild pitting edema in Right and Left upper extremity  Diet Order:  Diet NPO time specified Except for: Sips with Meds .TPN (CLINIMIX-E) Adult  Skin:  Reviewed, no issues  Last BM:  9/2  Height:   Ht Readings from Last 1 Encounters:  12/30/15 5\' 5"  (1.651 m)    Weight:   Wt Readings from Last 1 Encounters:  12/30/15 152 lb (68.9 kg)    Ideal Body Weight:     BMI:  Body mass index is 25.29 kg/m.  Estimated Nutritional Needs:   Kcal:  2040-2380 kcals/d  Protein:  82-102 g/d  Fluid:  >/=2 L  EDUCATION NEEDS:   No education needs identified at this time  Deona Novitski B. [name], RD, LDN [phone] (pager) Weekend/On-Call pager [phone])

## 2016-01-08 ENCOUNTER — Inpatient Hospital Stay: Payer: Medicare PPO

## 2016-01-08 LAB — MAGNESIUM: MAGNESIUM: 2.1 mg/dL (ref 1.7–2.4)

## 2016-01-08 LAB — COMPREHENSIVE METABOLIC PANEL
ALT: 13 U/L — AB (ref 17–63)
AST: 37 U/L (ref 15–41)
Albumin: 2.9 g/dL — ABNORMAL LOW (ref 3.5–5.0)
Alkaline Phosphatase: 77 U/L (ref 38–126)
Anion gap: 9 (ref 5–15)
BUN: 29 mg/dL — ABNORMAL HIGH (ref 6–20)
CALCIUM: 8.8 mg/dL — AB (ref 8.9–10.3)
CHLORIDE: 103 mmol/L (ref 101–111)
CO2: 26 mmol/L (ref 22–32)
CREATININE: 1.1 mg/dL (ref 0.61–1.24)
Glucose, Bld: 183 mg/dL — ABNORMAL HIGH (ref 65–99)
Potassium: 3.6 mmol/L (ref 3.5–5.1)
Sodium: 138 mmol/L (ref 135–145)
Total Bilirubin: 1.6 mg/dL — ABNORMAL HIGH (ref 0.3–1.2)
Total Protein: 6.1 g/dL — ABNORMAL LOW (ref 6.5–8.1)

## 2016-01-08 LAB — GLUCOSE, CAPILLARY
GLUCOSE-CAPILLARY: 182 mg/dL — AB (ref 65–99)
GLUCOSE-CAPILLARY: 199 mg/dL — AB (ref 65–99)
GLUCOSE-CAPILLARY: 220 mg/dL — AB (ref 65–99)
Glucose-Capillary: 238 mg/dL — ABNORMAL HIGH (ref 65–99)
Glucose-Capillary: 279 mg/dL — ABNORMAL HIGH (ref 65–99)
Glucose-Capillary: 191 mg/dL — ABNORMAL HIGH (ref 65–99)
Glucose-Capillary: 208 mg/dL — ABNORMAL HIGH (ref 65–99)
Glucose-Capillary: 194 mg/dL — ABNORMAL HIGH (ref 65–99)

## 2016-01-08 LAB — PHOSPHORUS: PHOSPHORUS: 3.1 mg/dL (ref 2.5–4.6)

## 2016-01-08 MED ORDER — INSULIN ASPART 100 UNIT/ML ~~LOC~~ SOLN
0.0000 [IU] | SUBCUTANEOUS | Status: DC
  Administered 2016-01-08: 4 [IU] via SUBCUTANEOUS
  Administered 2016-01-08: 21:00:00 7 [IU] via SUBCUTANEOUS
  Administered 2016-01-08: 4 [IU] via SUBCUTANEOUS
  Administered 2016-01-08: 09:00:00 11 [IU] via SUBCUTANEOUS
  Administered 2016-01-09 – 2016-01-10 (×9): 4 [IU] via SUBCUTANEOUS
  Filled 2016-01-08 (×4): qty 4
  Filled 2016-01-08: qty 11
  Filled 2016-01-08 (×3): qty 4
  Filled 2016-01-08: qty 7
  Filled 2016-01-08 (×4): qty 4

## 2016-01-08 MED ORDER — LORAZEPAM 2 MG/ML IJ SOLN
0.5000 mg | INTRAMUSCULAR | Status: DC | PRN
  Administered 2016-01-08 (×2): 0.5 mg via INTRAVENOUS
  Filled 2016-01-08 (×2): qty 1

## 2016-01-08 MED ORDER — BARIUM SULFATE 2.1 % PO SUSP
450.0000 mL | ORAL | Status: AC
  Administered 2016-01-08 (×2): 450 mL via ORAL

## 2016-01-08 MED ORDER — TRACE MINERALS CR-CU-MN-SE-ZN 10-1000-500-60 MCG/ML IV SOLN
INTRAVENOUS | Status: AC
  Administered 2016-01-08: 18:00:00 via INTRAVENOUS
  Filled 2016-01-08: qty 1800

## 2016-01-08 MED ORDER — PREDNISONE 10 MG PO TABS
5.0000 mg | ORAL_TABLET | Freq: Every day | ORAL | Status: DC
  Administered 2016-01-10: 5 mg via ORAL
  Filled 2016-01-08 (×2): qty 1

## 2016-01-08 MED ORDER — QUETIAPINE FUMARATE 25 MG PO TABS
50.0000 mg | ORAL_TABLET | Freq: Every day | ORAL | Status: DC
  Administered 2016-01-08 – 2016-01-09 (×2): 50 mg via ORAL
  Filled 2016-01-08 (×2): qty 2

## 2016-01-08 MED ORDER — TRAZODONE HCL 100 MG PO TABS
100.0000 mg | ORAL_TABLET | Freq: Every evening | ORAL | Status: DC | PRN
Start: 2016-01-08 — End: 2016-01-10

## 2016-01-08 NOTE — Plan of Care (Signed)
Problem: Education: Goal: Knowledge of [facility] Education information/materials will improve Outcome: Not Progressing Pt alert to self only. Lethargic. Spouse at the bedside, very supportive and involved in the plan of care and pt's care.  Problem: Physical Regulation: Goal: Ability to maintain clinical measurements within normal limits will improve Outcome: Not Progressing VSS. Lethargic. Ativan given once for agitation with good effect. 2xloose stools during the shift. Pt incontinent for urine and stool.   Problem: Nutrition: Goal: Adequate nutrition will be maintained Outcome: Not Progressing  Pt vomittedx3 throughout the shift. Zofran givenx2 with improvement. NPO except sips with meds. Continues TPN.

## 2016-01-08 NOTE — Consult Note (Signed)
PARENTERAL NUTRITION CONSULT NOTE - INITIAL  Pharmacy Consult for electrolytes/glucose Indication: TPN  Allergies  Allergen Reactions  . Contrast Media [Iodinated Diagnostic Agents] Anaphylaxis    Angioedema  . Aspirin Other (See Comments) and Itching    Pt is unable to take this medication.    . Ciprofloxacin Hcl Other (See Comments)    Reaction:  Unknown   . Ibuprofen Other (See Comments) and Itching    Pt is unable to take this medication.    . Metronidazole Other (See Comments)    Reaction:  Unknown   . Naproxen Other (See Comments)    Pt is unable to take this medication.    . Oxycodone Itching  . Tetracyclines & Related Other (See Comments)    Reaction:  Unknown   . Ciprofloxacin Rash  . Nitrofurantoin Rash  . Sulfa Antibiotics Rash  . Tetracycline Rash    Patient Measurements: Height: 5\' 5"  (165.1 cm) Weight: 165 lb 1.6 oz (74.9 kg) IBW/kg (Calculated) : 61.5 Adjusted Body Weight:  Usual Weight:   Vital Signs: Temp: 98.9 F (37.2 C) (09/06 0750) Temp Source: Oral (09/06 0750) BP: 164/82 (09/06 0820) Pulse Rate: 111 (09/06 0750) Intake/Output from previous day: 09/05 0701 - 09/06 0700 In: 1387.5 [P.O.:50; I.V.:1337.5] Out: 250 [Urine:250] Intake/Output from this shift: Total I/O In: -  Out: 450 [Urine:300; Emesis/NG output:150]  Labs:  Recent Labs  01/06/16 0520  WBC 7.2  HGB 11.5*  HCT 33.7*  PLT 186     Recent Labs  01/06/16 0520 01/07/16 0830 01/08/16 0441  NA 135 134* 138  K 3.4* 3.8 3.6  CL 102 101 103  CO2 25 25 26   GLUCOSE 128* 230* 183*  BUN 13 20 29*  CREATININE 0.73 0.96 1.10  CALCIUM 8.3* 8.6* 8.8*  MG  --  1.9 2.1  PHOS  --  2.7 3.1  PROT  --   --  6.1*  ALBUMIN  --   --  2.9*  AST  --   --  37  ALT  --   --  13*  ALKPHOS  --   --  77  BILITOT  --   --  1.6*   Estimated Creatinine Clearance: 57.4 mL/min (by C-G formula based on SCr of 1.1 mg/dL).    Recent Labs  01/08/16 0609 01/08/16 0836 01/08/16 1132   GLUCAP 238* 279* 199*    Medical History: Past Medical History:  Diagnosis Date  . Anxiety   . Cardiac defibrillator in place   . COPD (chronic obstructive pulmonary disease) (HCC)    On 2liter oxygen  . Depression   . Elevated lipids   . GERD (gastroesophageal reflux disease)   . Headache   . Hypertension   . Prostate enlargement   . PTSD (post-traumatic stress disorder)   . Shakes     Medications:  Scheduled:  . amLODipine  10 mg Oral Daily  . carvedilol  12.5 mg Oral BID WC  . enoxaparin (LOVENOX) injection  40 mg Subcutaneous QHS  . famotidine  20 mg Oral BID  . furosemide  40 mg Oral Daily  . insulin aspart  0-20 Units Subcutaneous Q4H  . ipratropium-albuterol  3 mL Nebulization TID  . lipase/protease/amylase  72,000 Units Oral TID AC  . mometasone-formoterol  2 puff Inhalation BID  . nystatin  5 mL Oral QID  . pantoprazole  40 mg Oral QAC breakfast  . potassium chloride  40 mEq Oral Daily  . [START ON 01/09/2016] predniSONE  5 mg Oral Q breakfast  . QUEtiapine  50 mg Oral QHS  . sertraline  100 mg Oral BID  . tamsulosin  0.4 mg Oral Daily    Insulin Requirements in the past 24 hours:  0 (none had been ordered)  Current Nutrition:  Clinimix E 5/20 @ 5150ml/hr  Assessment: Electrolytes are WNL. BG elevated.  Plan:  Increase rate to 575ml/hr. Continue to monitor BG and electrolytes Increase CBG to q 4 hours, change SSI to resistant scale. Also will add 19 units on regular insulin to TPN bag. (2/3 of the last 24hr of SSI).   Olene FlossMelissa D Chemika Nightengale, Pharm.D Clinical Pharmacist  01/08/2016,1:18 PM

## 2016-01-08 NOTE — Progress Notes (Signed)
Nutrition Follow-up  DOCUMENTATION CODES:   Severe malnutrition in context of acute illness/injury  INTERVENTION:  -Spoke with Dr Renae GlossWieting and agreeable to increasing TPN of 5%AA/20% dextrose to goal rate of 275ml/hr starting at 1800 today to better meet nutritional needs. Will hold off on starting lipids until tolerating TPN at goal rate. Recommend to continue to check BMP, Mg and Phosphorus.   NUTRITION DIAGNOSIS:   Malnutrition related to acute illness as evidenced by percent weight loss, energy intake < or equal to 50% for > or equal to 5 days.    GOAL:   Patient will meet greater than or equal to 90% of their needs    MONITOR:   Diet advancement, Weight trends, Labs  REASON FOR ASSESSMENT:   Consult New TPN/TNA  ASSESSMENT:      Planning repeat CT scan today with contrast.    Medications and labs reviewed:  Diet Order:  Diet NPO time specified Except for: Sips with Meds .TPN (CLINIMIX-E) Adult  Skin:  Reviewed, no issues  Last BM:  9/4  Height:   Ht Readings from Last 1 Encounters:  12/30/15 5\' 5"  (1.651 m)    Weight:   Noted admission wt of 152 pounds, current wt of 165 pounds. Will continue to follow wt trends.  Wt Readings from Last 1 Encounters:  01/08/16 165 lb 1.6 oz (74.9 kg)    Ideal Body Weight:     BMI:  Body mass index is 27.47 kg/m.  Estimated Nutritional Needs:   Kcal:  2040-2380 kcals/d  Protein:  82-102 g/d  Fluid:  >/=2 L  EDUCATION NEEDS:   No education needs identified at this time  Hiran Leard B. [name], RD, LDN [phone] (pager) Weekend/On-Call pager [phone])

## 2016-01-08 NOTE — Progress Notes (Signed)
Inpatient Diabetes Program Recommendations  AACE/ADA: New Consensus Statement on Inpatient Glycemic Control (2015)  Target Ranges:  Prepandial:   less than 140 mg/dL      Peak postprandial:   less than 180 mg/dL (1-2 hours)      Critically ill patients:  140 - 180 mg/dL   Results for Robert Ball, Robert Ball (MRN [id number]) as of 01/08/2016 09:43  Ref. Range 01/07/2016 09:41 01/07/2016 16:55 01/08/2016 00:02 01/08/2016 06:09 01/08/2016 08:36  Glucose-Capillary Latest Ref Range: 65 - 99 mg/dL 409226 (H) 811223 (H) 914220 (H) 238 (H) 279 (H)   Review of Glycemic Control  Diabetes history: No DM Outpatient Diabetes medications: NA Current orders for Inpatient glycemic control: Novolog 0-20 units Q4H  Inpatient Diabetes Program Recommendations: Insulin -TPN Coverage: Note glucose is more elevated since starting TPN. Please consider ordering Novolog 3 units Q4H for TPN coverage. If TPN is stopped or held then Novolog TPN coverage would also be stopped or held.  Thanks, Orlando PennerMarie Emmarie Sannes, RN, MSN, CDE Diabetes Coordinator Inpatient Diabetes Program [phone] (Team Pager from 8am to 5pm) [phone] ([facility] office) [phone] [facility] office) [phone] [facility] office)

## 2016-01-08 NOTE — Consult Note (Addendum)
Subjective: Patient seen for acute on chronic pancreatitis. Patient continues to complain of upper abdominal pain. He is passing flatus. There's been no nausea or emesis today. He is tolerating the contrast better today. He is not yet finish it. He has minimal responding to questioning. He appears mildly uncomfortable.  Objective: Vital signs in last 24 hours: Temp:  [97.4 F (36.3 C)-98.9 F (37.2 C)] 98.9 F (37.2 C) (09/06 0750) Pulse Rate:  [96-111] 111 (09/06 0750) Resp:  [7-22] 22 (09/06 0750) BP: (139-164)/(79-98) 164/82 (09/06 0820) SpO2:  [97 %-100 %] 97 % (09/06 0750) Weight:  [74.9 kg (165 lb 1.6 oz)] 74.9 kg (165 lb 1.6 oz) (09/06 0413) Blood pressure (!) 164/82, pulse (!) 111, temperature 98.9 F (37.2 C), temperature source Oral, resp. rate (!) 22, height 5\' 5"  (1.651 m), weight 74.9 kg (165 lb 1.6 oz), SpO2 97 %.   Intake/Output from previous day: 09/05 0701 - 09/06 0700 In: 1387.5 [P.O.:50; I.V.:1337.5] Out: 250 [Urine:250]  Intake/Output this shift: Total I/O In: -  Out: 450 [Urine:300; Emesis/NG output:150]   General appearance:  A 70 year old male no acute distress Resp:  Bilaterally clear to auscultation Cardio:  Regular rate and rhythm without rub or gallop GI:  Protuberant soft bowel sounds are positive mild tenderness to palpation across the epigastrium. Extremities:  No clubbing cyanosis or edema   Lab Results: Results for orders placed or performed during the hospital encounter of 12/30/15 (from the past 24 hour(s))  Glucose, capillary     Status: Abnormal   Collection Time: 01/07/16  4:55 PM  Result Value Ref Range   Glucose-Capillary 223 (H) 65 - 99 mg/dL  Glucose, capillary     Status: Abnormal   Collection Time: 01/08/16 12:02 AM  Result Value Ref Range   Glucose-Capillary 220 (H) 65 - 99 mg/dL  Comprehensive metabolic panel     Status: Abnormal   Collection Time: 01/08/16  4:41 AM  Result Value Ref Range   Sodium 138 135 - 145 mmol/L    Potassium 3.6 3.5 - 5.1 mmol/L   Chloride 103 101 - 111 mmol/L   CO2 26 22 - 32 mmol/L   Glucose, Bld 183 (H) 65 - 99 mg/dL   BUN 29 (H) 6 - 20 mg/dL   Creatinine, Ser 4.09 0.61 - 1.24 mg/dL   Calcium 8.8 (L) 8.9 - 10.3 mg/dL   Total Protein 6.1 (L) 6.5 - 8.1 g/dL   Albumin 2.9 (L) 3.5 - 5.0 g/dL   AST 37 15 - 41 U/L   ALT 13 (L) 17 - 63 U/L   Alkaline Phosphatase 77 38 - 126 U/L   Total Bilirubin 1.6 (H) 0.3 - 1.2 mg/dL   GFR calc non Af Amer >60 >60 mL/min   GFR calc Af Amer >60 >60 mL/min   Anion gap 9 5 - 15  Phosphorus     Status: None   Collection Time: 01/08/16  4:41 AM  Result Value Ref Range   Phosphorus 3.1 2.5 - 4.6 mg/dL  Magnesium     Status: None   Collection Time: 01/08/16  4:41 AM  Result Value Ref Range   Magnesium 2.1 1.7 - 2.4 mg/dL  Glucose, capillary     Status: Abnormal   Collection Time: 01/08/16  6:09 AM  Result Value Ref Range   Glucose-Capillary 238 (H) 65 - 99 mg/dL  Glucose, capillary     Status: Abnormal   Collection Time: 01/08/16  8:36 AM  Result Value Ref Range  Glucose-Capillary 279 (H) 65 - 99 mg/dL  Glucose, capillary     Status: Abnormal   Collection Time: 01/08/16 11:32 AM  Result Value Ref Range   Glucose-Capillary 199 (H) 65 - 99 mg/dL      Recent Labs  16/02/9608/04/17 0520  WBC 7.2  HGB 11.5*  HCT 33.7*  PLT 186   BMET  Recent Labs  01/06/16 0520 01/07/16 0830 01/08/16 0441  NA 135 134* 138  K 3.4* 3.8 3.6  CL 102 101 103  CO2 25 25 26   GLUCOSE 128* 230* 183*  BUN 13 20 29*  CREATININE 0.73 0.96 1.10  CALCIUM 8.3* 8.6* 8.8*   LFT  Recent Labs  01/08/16 0441  PROT 6.1*  ALBUMIN 2.9*  AST 37  ALT 13*  ALKPHOS 77  BILITOT 1.6*   PT/INR No results for input(s): LABPROT, INR in the last 72 hours. Hepatitis Panel No results for input(s): HEPBSAG, HCVAB, HEPAIGM, HEPBIGM in the last 72 hours. C-Diff No results for input(s): CDIFFTOX in the last 72 hours. No results for input(s): CDIFFPCR in the last 72  hours.   Studies/Results: No results found.  Scheduled Inpatient Medications:   . amLODipine  10 mg Oral Daily  . carvedilol  12.5 mg Oral BID WC  . enoxaparin (LOVENOX) injection  40 mg Subcutaneous QHS  . famotidine  20 mg Oral BID  . furosemide  40 mg Oral Daily  . insulin aspart  0-20 Units Subcutaneous Q4H  . ipratropium-albuterol  3 mL Nebulization TID  . lipase/protease/amylase  72,000 Units Oral TID AC  . mometasone-formoterol  2 puff Inhalation BID  . nystatin  5 mL Oral QID  . pantoprazole  40 mg Oral QAC breakfast  . potassium chloride  40 mEq Oral Daily  . predniSONE  10 mg Oral Q breakfast  . QUEtiapine  50 mg Oral QHS  . roflumilast  500 mcg Oral Daily  . sertraline  100 mg Oral BID  . tamsulosin  0.4 mg Oral Daily    Continuous Inpatient Infusions:   . Marland Kitchen.TPN (CLINIMIX-E) Adult 50 mL/hr at 01/07/16 1800    PRN Inpatient Medications:  acetaminophen **OR** acetaminophen, albuterol, heparin lock flush, HYDROcodone-acetaminophen, LORazepam, morphine injection, ondansetron **OR** ondansetron (ZOFRAN) IV, sodium chloride flush, traZODone  Miscellaneous:   Assessment:  1. Acute on chronic pancreatitis. He used to be more comfortable today but continues with epigastric pain. He is currently tolerating his by mouth contrast for repeat CT scan today.  Plan:  1. Awaiting CT as noted. Case discussed with Dr. Fonnie BirkenheadWhiting. Would continue current management. The CT is uninformative would continue on same course as current although it may take some time for improvement. I will not be available until Monday.   Christena DeemMartin U Skulskie MD 01/08/2016, 11:45 AM

## 2016-01-08 NOTE — Progress Notes (Signed)
Patient ID: Robert Ball, male   DOB: 04-15-1946, 70 y.o.   MRN: [id number]   [facility] PROGRESS NOTE  Robert Ball [id number] DOB: 04-15-1946 DOA: 12/30/2015 PCP: Mickey FarberHIES, DAVID, MD  HPI/Subjective: Patient again did not sleep last night. Answering questions but better today than yesterday. Still having abdominal pain. Yesterday vomited up the contrast with CT and we will try again today.  Objective: Vitals:   01/08/16 0750 01/08/16 0820  BP: (!) 164/82 (!) 164/82  Pulse: (!) 111   Resp: (!) 22   Temp: 98.9 F (37.2 C)     Filed Weights   12/30/15 1830 01/08/16 0348 01/08/16 0413  Weight: 68.9 kg (152 lb) 74.9 kg (165 lb 1.6 oz) 74.9 kg (165 lb 1.6 oz)    ROS: Review of Systems  Unable to perform ROS: Acuity of condition  Respiratory: Positive for shortness of breath.   Gastrointestinal: Positive for abdominal pain, nausea and vomiting.  Neurological: Positive for weakness.   Exam: Physical Exam  Constitutional: He appears lethargic.  HENT:  Nose: No mucosal edema.  Mouth/Throat: No oropharyngeal exudate or posterior oropharyngeal edema.  Thrush seen in the mouth.  Eyes: Conjunctivae and lids are normal. Pupils are equal, round, and reactive to light.  Neck: No JVD present. Carotid bruit is not present. No edema present. No thyroid mass and no thyromegaly present.  Cardiovascular: Regular rhythm, S1 normal and S2 normal.  Exam reveals no gallop.   No murmur heard. Pulses:      Dorsalis pedis pulses are 2+ on the right side, and 2+ on the left side.  Respiratory: No respiratory distress. He has decreased breath sounds in the right lower field and the left lower field. He has no wheezes. He has no rhonchi. He has no rales.  GI: Soft. Bowel sounds are normal. There is tenderness in the right upper quadrant and epigastric area.  Musculoskeletal:       Right ankle: He exhibits swelling.       Left ankle: He exhibits swelling.  Lymphadenopathy:    He has no  cervical adenopathy.  Neurological: He appears lethargic.  Skin: Skin is warm. No rash noted. Nails show no clubbing.  Psychiatric: His speech is delayed.      Data Reviewed: Basic Metabolic Panel:  Recent Labs Lab 01/04/16 0434 01/05/16 0402 01/06/16 0520 01/07/16 0830 01/08/16 0441  NA 138 135 135 134* 138  K 3.1* 3.6 3.4* 3.8 3.6  CL 103 103 102 101 103  CO2 26 28 25 25 26   GLUCOSE 142* 133* 128* 230* 183*  BUN 11 12 13 20  29*  CREATININE 0.70 0.76 0.73 0.96 1.10  CALCIUM 8.5* 8.4* 8.3* 8.6* 8.8*  MG  --   --   --  1.9 2.1  PHOS  --   --   --  2.7 3.1   Liver Function Tests:  Recent Labs Lab 01/04/16 0434 01/05/16 0402 01/08/16 0441  AST 11* 18 37  ALT 6* 8* 13*  ALKPHOS 33* 40 77  BILITOT 0.6 0.4 1.6*  PROT 5.7* 5.7* 6.1*  ALBUMIN 2.8* 2.9* 2.9*    Recent Labs Lab 01/03/16 0419 01/04/16 0434 01/05/16 0402 01/06/16 0520 01/07/16 0830  LIPASE 77* 87* 136* 211* 143*    Recent Labs Lab 01/01/16 1925  AMMONIA 9   CBC:  Recent Labs Lab 01/02/16 0451 01/03/16 0419 01/06/16 0520  WBC 6.2 4.5 7.2  HGB 10.5* 9.1* 11.5*  HCT 30.0* 26.0* 33.7*  MCV 87.1 86.6  86.3  PLT 172 151 186     Scheduled Meds: . amLODipine  10 mg Oral Daily  . carvedilol  12.5 mg Oral BID WC  . enoxaparin (LOVENOX) injection  40 mg Subcutaneous QHS  . famotidine  20 mg Oral BID  . furosemide  40 mg Oral Daily  . insulin aspart  0-20 Units Subcutaneous Q4H  . ipratropium-albuterol  3 mL Nebulization TID  . lipase/protease/amylase  72,000 Units Oral TID AC  . mometasone-formoterol  2 puff Inhalation BID  . nystatin  5 mL Oral QID  . pantoprazole  40 mg Oral QAC breakfast  . potassium chloride  40 mEq Oral Daily  . [START ON 01/09/2016] predniSONE  5 mg Oral Q breakfast  . QUEtiapine  50 mg Oral QHS  . sertraline  100 mg Oral BID  . tamsulosin  0.4 mg Oral Daily    Assessment/Plan:  1. Acute on chronic pancreatitis and epigastric pain. Continue nothing by mouth  and TPN. Try to get a CT scan of the abdomen with oral contrast today. Case discussed with Dr. Marva Panda gastroenterology. Stopped Daliresp. 2. Increased lethargy and insomnia. Stop the Geodon at night and switched to Seroquel. Also give when necessary trazodone. Wife states that he did well with Ativan and I gave some Ativan and he seemed to calm down a little bit as per the nursing staff. 3. COPD exacerbation.  Decrease prednisone down to 5 mg daily starting tomorrow. Continue taper to off. Hold off on any antibiotics with recent C. Difficile. 4. Hypokalemia. Replace daily. 5. Hypomagnesemia replaced 6. Essential hypertension continue usual medications 7. Depression continue usual medications 8. Recent history of C. difficile colitis 9. Chronic respiratory failure on 3 L of oxygen 10. Weakness. Continue physical therapy evaluation 11. Thrush. Nystatin swish and swallow 12. Sliding scale insulin with high sugars with steroids and TPN  Code Status:     Code Status Orders        Start     Ordered   12/31/15 0039  Full code  Continuous     12/31/15 0039    Code Status History    Date Active Date Inactive Code Status Order ID Comments User Context   12/05/2015  7:29 PM 12/11/2015  8:11 PM Full Code [id number]  Gwendolyn Fill, NP ED   04/09/2015 10:30 PM 04/11/2015  6:23 PM Full Code [id number]  Houston Siren, MD Inpatient   12/27/2014  3:17 PM 12/27/2014  7:47 PM Full Code [id number]  Marcina Millard, MD Inpatient     Family Communication: Spoke with wife at the bedside. Disposition Plan: Unclear at this time.   Consultants:  Gastroenterology  Time spent: 25 minutes  Alford Highland  [employer]

## 2016-01-08 NOTE — Progress Notes (Signed)
PT Cancellation Note  Patient Details Name: Robert Ball MRN: [id number] DOB: 02/10/1946   Cancelled Treatment:    Reason Eval/Treat Not Completed: Patient declined, no reason specified. Treatment attempted; pt/spouse refused at this time. Pt feeling ill with emesis this morn according to spouse. Pt currently in bed with cold cloth on his head attempting to slowly drink contrast for repeat CT scan. Re attempt treatment at a later time/date as the schedule and pt symptoms allow.    Kristeen MissHeidi Elizabeth Bishop, PTA 01/08/2016, 10:34 AM

## 2016-01-09 DIAGNOSIS — R404 Transient alteration of awareness: Secondary | ICD-10-CM

## 2016-01-09 DIAGNOSIS — Z7189 Other specified counseling: Secondary | ICD-10-CM

## 2016-01-09 DIAGNOSIS — Z789 Other specified health status: Secondary | ICD-10-CM

## 2016-01-09 DIAGNOSIS — K859 Acute pancreatitis without necrosis or infection, unspecified: Principal | ICD-10-CM

## 2016-01-09 DIAGNOSIS — K861 Other chronic pancreatitis: Secondary | ICD-10-CM

## 2016-01-09 DIAGNOSIS — Z515 Encounter for palliative care: Secondary | ICD-10-CM

## 2016-01-09 LAB — LIPASE, BLOOD: Lipase: 54 U/L — ABNORMAL HIGH (ref 11–51)

## 2016-01-09 LAB — GLUCOSE, CAPILLARY
GLUCOSE-CAPILLARY: 164 mg/dL — AB (ref 65–99)
GLUCOSE-CAPILLARY: 166 mg/dL — AB (ref 65–99)
GLUCOSE-CAPILLARY: 175 mg/dL — AB (ref 65–99)
GLUCOSE-CAPILLARY: 196 mg/dL — AB (ref 65–99)
Glucose-Capillary: 160 mg/dL — ABNORMAL HIGH (ref 65–99)
Glucose-Capillary: 165 mg/dL — ABNORMAL HIGH (ref 65–99)

## 2016-01-09 LAB — BASIC METABOLIC PANEL
Anion gap: 8 (ref 5–15)
BUN: 38 mg/dL — AB (ref 6–20)
CALCIUM: 8.5 mg/dL — AB (ref 8.9–10.3)
CO2: 24 mmol/L (ref 22–32)
CREATININE: 1.18 mg/dL (ref 0.61–1.24)
Chloride: 106 mmol/L (ref 101–111)
GFR, EST NON AFRICAN AMERICAN: 60 mL/min — AB (ref >60)
Glucose, Bld: 168 mg/dL — ABNORMAL HIGH (ref 65–99)
Potassium: 3.1 mmol/L — ABNORMAL LOW (ref 3.5–5.1)
SODIUM: 138 mmol/L (ref 135–145)

## 2016-01-09 LAB — PHOSPHORUS: PHOSPHORUS: 3.6 mg/dL (ref 2.5–4.6)

## 2016-01-09 LAB — MAGNESIUM: Magnesium: 2.1 mg/dL (ref 1.7–2.4)

## 2016-01-09 MED ORDER — TRACE MINERALS CR-CU-MN-SE-ZN 10-1000-500-60 MCG/ML IV SOLN
INTRAVENOUS | Status: AC
  Administered 2016-01-09: 18:00:00 via INTRAVENOUS
  Filled 2016-01-09: qty 1800

## 2016-01-09 MED ORDER — POTASSIUM CHLORIDE 10 MEQ/100ML IV SOLN
10.0000 meq | INTRAVENOUS | Status: DC
  Filled 2016-01-09 (×4): qty 100

## 2016-01-09 MED ORDER — POTASSIUM CHLORIDE 10 MEQ/100ML IV SOLN
10.0000 meq | INTRAVENOUS | Status: AC
  Administered 2016-01-09 (×4): 10 meq via INTRAVENOUS

## 2016-01-09 MED ORDER — FUROSEMIDE 10 MG/ML IJ SOLN
20.0000 mg | Freq: Every day | INTRAMUSCULAR | Status: DC
  Administered 2016-01-09 – 2016-01-10 (×2): 20 mg via INTRAVENOUS
  Filled 2016-01-09 (×2): qty 2

## 2016-01-09 NOTE — Progress Notes (Signed)
Nutrition Follow-up  DOCUMENTATION CODES:   Severe malnutrition in context of acute illness/injury  INTERVENTION:  -Continue TPN of 5%AA/20% dextrose at goal rate of 1375ml/hr at this time. Pharmacy following as well.  Palliative care consult noted.    NUTRITION DIAGNOSIS:   Malnutrition related to acute illness as evidenced by percent weight loss, energy intake < or equal to 50% for > or equal to 5 days.    GOAL:   Patient will meet greater than or equal to 90% of their needs    MONITOR:   Diet advancement, Weight trends, Labs  REASON FOR ASSESSMENT:   Consult New TPN/TNA  ASSESSMENT:      Pt receiving TPN via PICC line  Medications and labs reviewed:  UOP: 300ml noted. Spoke with RN, [name] and pt is incontinent wearing diaper today, at times is able to use urinal.    Diet Order:  Diet NPO time specified Except for: Sips with Meds .TPN (CLINIMIX-E) Adult  Skin:  Reviewed, no issues  Last BM:  9/4  Height:   Ht Readings from Last 1 Encounters:  12/30/15 5\' 5"  (1.651 m)    Weight:   Wt Readings from Last 1 Encounters:  01/09/16 165 lb (74.8 kg)    Ideal Body Weight:     BMI:  Body mass index is 27.46 kg/m.  Estimated Nutritional Needs:   Kcal:  2040-2380 kcals/d  Protein:  82-102 g/d  Fluid:  >/=2 L  EDUCATION NEEDS:   No education needs identified at this time  Robert Ball B. Robert BusmanAllen, RD, LDN [phone] (pager) Weekend/On-Call pager [phone])

## 2016-01-09 NOTE — Care Management Important Message (Signed)
Important Message  Patient Details  Name: Reece AgarBilly R Brinker MRN: [id number] Date of Birth: 1945/09/16   Medicare Important Message Given:  Yes    Gwenette GreetBrenda S Birdie Beveridge, RN 01/09/2016, 8:11 AM

## 2016-01-09 NOTE — Progress Notes (Signed)
PT Cancellation Note  Patient Details Name: Robert Ball MRN: [id number] DOB: 1946-03-04   Cancelled Treatment:    Reason Eval/Treat Not Completed: Medical issues which prohibited therapy;Other (comment). Treatment attempted; pt not responding to questioning and spouse notes pt is too weak to participate. Spoke with nursing who states pt is not doing well and currently has a palliative consult. Nursing states to hold pt at this time. Re attempt at a later time/date depending on pt's medical status.    Kristeen MissHeidi Elizabeth Bishop, PTA 01/09/2016, 10:50 AM

## 2016-01-09 NOTE — Care Management Important Message (Signed)
Important Message  Patient Details  Name: Robert Ball MRN: [id number] Date of Birth: 09-12-1945   Medicare Important Message Given:  Yes    Gwenette GreetBrenda S Creta Dorame, RN 01/09/2016, 8:12 AM

## 2016-01-09 NOTE — Progress Notes (Signed)
BP 148/113, per wife, pt uncomfortable from multiple BM's.  NA gave bed bath, applied barrier cream.  Per Dr. Sheryle Hailiamond, will recheck BP ~30 min.

## 2016-01-09 NOTE — Consult Note (Signed)
PARENTERAL NUTRITION CONSULT NOTE - INITIAL  Pharmacy Consult for electrolytes/glucose Indication: TPN  Allergies  Allergen Reactions  . Contrast Media [Iodinated Diagnostic Agents] Anaphylaxis    Angioedema  . Aspirin Other (See Comments) and Itching    Pt is unable to take this medication.    . Ciprofloxacin Hcl Other (See Comments)    Reaction:  Unknown   . Ibuprofen Other (See Comments) and Itching    Pt is unable to take this medication.    . Metronidazole Other (See Comments)    Reaction:  Unknown   . Naproxen Other (See Comments)    Pt is unable to take this medication.    . Oxycodone Itching  . Tetracyclines & Related Other (See Comments)    Reaction:  Unknown   . Ciprofloxacin Rash  . Nitrofurantoin Rash  . Sulfa Antibiotics Rash  . Tetracycline Rash    Patient Measurements: Height: 5\' 5"  (165.1 cm) Weight: 165 lb (74.8 kg) IBW/kg (Calculated) : 61.5 Adjusted Body Weight:  Usual Weight:   Vital Signs: Temp: 98.3 F (36.8 C) (09/07 0441) Temp Source: Oral (09/07 0441) BP: 97/81 (09/07 0612) Pulse Rate: 51 (09/07 0612) Intake/Output from previous day: 09/06 0701 - 09/07 0700 In: 735 [I.V.:735] Out: 450 [Urine:300; Emesis/NG output:150] Intake/Output from this shift: No intake/output data recorded.  Labs: No results for input(s): WBC, HGB, HCT, PLT, APTT, INR in the last 72 hours.   Recent Labs  01/07/16 0830 01/08/16 0441 01/09/16 0419  NA 134* 138 138  K 3.8 3.6 3.1*  CL 101 103 106  CO2 25 26 24   GLUCOSE 230* 183* 168*  BUN 20 29* 38*  CREATININE 0.96 1.10 1.18  CALCIUM 8.6* 8.8* 8.5*  MG 1.9 2.1 2.1  PHOS 2.7 3.1 3.6  PROT  --  6.1*  --   ALBUMIN  --  2.9*  --   AST  --  37  --   ALT  --  13*  --   ALKPHOS  --  77  --   BILITOT  --  1.6*  --    Estimated Creatinine Clearance: 53.5 mL/min (by C-G formula based on SCr of 1.18 mg/dL).    Recent Labs  01/08/16 2336 01/09/16 0505 01/09/16 0736  GLUCAP 182* 160* 165*    Medical  History: Past Medical History:  Diagnosis Date  . Anxiety   . Cardiac defibrillator in place   . COPD (chronic obstructive pulmonary disease) (HCC)    On 2liter oxygen  . Depression   . Elevated lipids   . GERD (gastroesophageal reflux disease)   . Headache   . Hypertension   . Prostate enlargement   . PTSD (post-traumatic stress disorder)   . Shakes     Medications:  Scheduled:  . amLODipine  10 mg Oral Daily  . carvedilol  12.5 mg Oral BID WC  . enoxaparin (LOVENOX) injection  40 mg Subcutaneous QHS  . famotidine  20 mg Oral BID  . furosemide  40 mg Oral Daily  . insulin aspart  0-20 Units Subcutaneous Q4H  . ipratropium-albuterol  3 mL Nebulization TID  . lipase/protease/amylase  72,000 Units Oral TID AC  . mometasone-formoterol  2 puff Inhalation BID  . nystatin  5 mL Oral QID  . pantoprazole  40 mg Oral QAC breakfast  . potassium chloride  10 mEq Intravenous Q1 Hr x 4  . potassium chloride  40 mEq Oral Daily  . predniSONE  5 mg Oral Q breakfast  .  QUEtiapine  50 mg Oral QHS  . sertraline  100 mg Oral BID  . tamsulosin  0.4 mg Oral Daily    Insulin Requirements in the past 24 hours:  9/6 0700-9/7 0659 about 43 units (34 units in SSI and about 9 units from TPN bag)  Current Nutrition:  Clinimix E 5/20 @ 4175ml/hr  Assessment: K=3.1, all other electrolytes are WNL. Pt did not get his scheduled 40 MEQ po K yesterday, per MAR pt cannot tolerate. Pt still on lasix 40mg  daily. BG are improving, currently in the 160's.  Plan:  Continue rate at 4575ml/hr. Continue to monitor BG and electrolytes Will give KCL 10 MEQ IV x 4.  Increase insulin in TPN to 28 units. Continue SSI q 4hr with CBG checks q 4hr for now. Check electrolytes in the AM  Kimaria Struthers D Marah Park, Pharm.D Clinical Pharmacist  01/09/2016,7:53 AM

## 2016-01-09 NOTE — Progress Notes (Addendum)
[facility] - [place] at [facility]   PATIENT NAME: Robert ChandlerBilly Ball    MR#:  [id number]  DATE OF BIRTH:  [birth year]  SUBJECTIVE:  CHIEF COMPLAINT:   Chief Complaint  Patient presents with  . Rectal Bleeding   - Wife at bedside today. Patient is arousable and answering simple questions. Currently comfortable and denies any pain. -CT of the abdomen still showing severe pancreatitis.  REVIEW OF SYSTEMS:  Review of Systems  Unable to perform ROS: Mental status change    DRUG ALLERGIES:   Allergies  Allergen Reactions  . Contrast Media [Iodinated Diagnostic Agents] Anaphylaxis    Angioedema  . Aspirin Other (See Comments) and Itching    Pt is unable to take this medication.    . Ciprofloxacin Hcl Other (See Comments)    Reaction:  Unknown   . Ibuprofen Other (See Comments) and Itching    Pt is unable to take this medication.    . Metronidazole Other (See Comments)    Reaction:  Unknown   . Naproxen Other (See Comments)    Pt is unable to take this medication.    . Oxycodone Itching  . Tetracyclines & Related Other (See Comments)    Reaction:  Unknown   . Ciprofloxacin Rash  . Nitrofurantoin Rash  . Sulfa Antibiotics Rash  . Tetracycline Rash    VITALS:  Blood pressure (!) 115/49, pulse 83, temperature 99 F (37.2 C), temperature source Oral, resp. rate 18, height 5\' 5"  (1.651 m), weight 74.8 kg (165 lb), SpO2 100 %.  PHYSICAL EXAMINATION:  Physical Exam  GENERAL:  70 y.o.-year-old patient lying in the bed with no acute distress. Critically ill appearing. EYES: Pupils equal, round, reactive to light and accommodation. No scleral icterus. Extraocular muscles intact.  HEENT: Head atraumatic, normocephalic. Oropharynx and nasopharynx clear. Very dry mucous membranes. Thrush in the mouth noted. NECK:  Supple, no jugular venous distention. No thyroid enlargement, no tenderness.  LUNGS: Normal breath sounds bilaterally, no wheezing, rales,rhonchi or  crepitation. No use of accessory muscles of respiration. Decreased bibasilar breath sounds.  CARDIOVASCULAR: S1, S2 normal. No murmurs, rubs, or gallops.  ABDOMEN: Soft, discomfort on palpation in the epigastric, right upper quadrant regions, no guarding or rigidity noted., nondistended. hypoactive Bowel sounds present. No organomegaly or mass.  EXTREMITIES: No  cyanosis, or clubbing. 1+ ankle edema noted NEUROLOGIC: Lethargic and arousable. Unable to do a complete neuro exam. Moving around in bed. Global weakness.  PSYCHIATRIC: The patient is lethargic, but nodding yes or no to simple questions.  SKIN: No obvious rash, lesion, or ulcer.    LABORATORY PANEL:   CBC  Recent Labs Lab 01/06/16 0520  WBC 7.2  HGB 11.5*  HCT 33.7*  PLT 186   ------------------------------------------------------------------------------------------------------------------  Chemistries   Recent Labs Lab 01/08/16 0441 01/09/16 0419  NA 138 138  K 3.6 3.1*  CL 103 106  CO2 26 24  GLUCOSE 183* 168*  BUN 29* 38*  CREATININE 1.10 1.18  CALCIUM 8.8* 8.5*  MG 2.1 2.1  AST 37  --   ALT 13*  --   ALKPHOS 77  --   BILITOT 1.6*  --    ------------------------------------------------------------------------------------------------------------------  Cardiac Enzymes No results for input(s): TROPONINI in the last 168 hours. ------------------------------------------------------------------------------------------------------------------  RADIOLOGY:  Ct Abdomen Pelvis Wo Contrast  Result Date: 01/08/2016 CLINICAL DATA:  70 year old male with abdominal pain. Chronic pancreatitis and epigastric pain. Nausea and vomiting. EXAM: CT ABDOMEN AND PELVIS WITHOUT CONTRAST TECHNIQUE: Multidetector CT  imaging of the abdomen and pelvis was performed following the standard protocol without IV contrast. COMPARISON:  CT the abdomen and pelvis 12/05/2015. FINDINGS: Lower chest: Patchy multifocal airspace disease in the  lower lobes of the lungs bilaterally (right greater than left), and in the lateral segment of the right middle lobe, concerning for sequela of recent aspiration. Pacemaker leads in the right atrium, right ventricular apex and overlying the lateral wall the left ventricle via the coronary sinus and coronary veins. 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 Gallbladder is nearly decompressed, but is surrounded by inflammatory changes. No definite calcified gallstones are noted. Pancreas: There is diffuse heterogeneity throughout the pancreas, which is surrounded by extensive inflammatory changes, compatible with an acute pancreatitis. Several areas of higher attenuation are noted within the pancreatic parenchyma, which could represent areas of the intrapancreatic hemorrhage, the largest of which is in the mid body measuring up to 4 cm in diameter. Some coarse calcifications are also noted in the head of the pancreas, similar to the prior study, related to prior episodes of pancreatitis. 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 Spleen: Spleen is enlarged measuring 15 x 7.3 x 17.7 cm (estimated splenic volume of 969 mL). Adrenals/Urinary Tract: Calcifications associated with the renal hila bilaterally appear to be vascular. Unenhanced appearance of the kidneys are otherwise unremarkable. No hydroureteronephrosis. Bilateral adrenal glands are normal in appearance. Unenhanced appearance of the urinary bladder is normal. Stomach/Bowel: Unenhanced appearance  of the stomach is normal. No pathologic dilatation of small bowel or colon. Normal appendix. Vascular/Lymphatic: Aortic atherosclerosis, without definite aneurysm in the abdominal or pelvic vasculature on today's noncontrast CT examination. No lymphadenopathy noted in the abdomen or pelvis. Reproductive: Prostate gland seminal vesicles are unremarkable in appearance. Other: Small volume of ascites. Diffuse inflammatory changes throughout the retroperitoneum related to underlying pancreatitis. No pneumoperitoneum. Musculoskeletal: There are no aggressive appearing lytic or blastic lesions noted in the visualized portions of the skeleton. IMPRESSION: 1. Findings are compatible with severe acute pancreatitis. Given the presence of areas of high attenuation in the pancreatic parenchyma, some degree of intrapancreatic hemorrhage is suspected, particularly in the head and body of the pancreas. 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 2. Splenomegaly. 3. Aortic atherosclerosis. 4. Additional incidental findings, as above. Electronically Signed   By: Trudie Reed M.D.   On: 01/08/2016 14:43   Ct Head Wo Contrast  Result Date: 01/08/2016 CLINICAL DATA:  Altered mental status.  Weakness. EXAM: CT HEAD WITHOUT CONTRAST TECHNIQUE: Contiguous axial images were obtained from the base of the skull through the vertex without intravenous contrast. COMPARISON:  07/11/2015 FINDINGS: Brain: Mild generalized atrophy. No evidence of acute infarction. Mild changes of chronic small-vessel disease of the hemispheric white matter. No large vessel territory infarction. No mass lesion, hemorrhage, hydrocephalus or extra-axial collection. No pituitary mass. Vascular: There is atherosclerotic calcification of the major vessels at the base of the brain. Skull: Normal Sinuses/Orbits: Clear/normal Other: None significant IMPRESSION: Atrophy and mild chronic small-vessel  ischemic change of the white matter. Atherosclerotic calcification of the major vessels at the base of the brain. No  acute finding. Electronically Signed   By: Paulina Fusi M.D.   On: 01/08/2016 14:24    EKG:   Orders placed or performed during the hospital encounter of 06/27/15  . EKG 12-Lead  . EKG 12-Lead  . ED EKG  . ED EKG    ASSESSMENT AND PLAN:   70 year old male with past medical history significant for COPD on home oxygen, CAD, cardiac defibrillator in place, hyperlipidemia presents to the hospital secondary to worsening abdominal pain and nausea.  #1 Acute on chronic pancreatitis- un resolving, worseing - repeat CT abd with severe acute pancreatitis, patient requiring pain meds, on TPN now - GI consult appreciated -appears ill- discussed with wife and consulted palliative care  #2 Metabolic encephalopathy- ct head without acute findings, chronic changes noted. - mostly from pain meds, geodon changed to seroquel at bedtime  #3 COPD- on low dose prednisone maintenance. Has end stage COPD per wife and was on hospice secondary to that in the past - now 3L o2 - cont inh and monitor  #4 Hypokalemia- replace, on TPN  #5 Hypertension-on Norvasc, Coreg and Lasix  #6 DVT prophylaxis-on Lovenox   Overall poor prognosis. Palliative care consulted.    All the records are reviewed and case discussed with Care Management/Social Workerr. Management plans discussed with the patient, family and they are in agreement.  CODE STATUS: Full code  TOTAL TIME TAKING CARE OF THIS PATIENT: 37 minutes.   POSSIBLE D/C IN ? DAYS, DEPENDING ON CLINICAL CONDITION.   Enid Baas M.D on 01/09/2016 at 11:36 AM  Between 7am to 6pm - Pager - [phone]  After 6pm go to [email or web address] - password [email or web address]  [facility]  Office  [phone]  CC: Primary care physician; Mickey Farber, MD

## 2016-01-09 NOTE — Consult Note (Signed)
Consultation Note Date: 01/09/2016   Patient Name: Robert Ball  DOB: [birth year]  MRN: [id number]  Age / Sex: 70 y.o., male  PCP: Mickey Farber, MD Referring Physician: Enid Baas, MD  Reason for Consultation: Establishing goals of care and Psychosocial/spiritual support  HPI/Patient Profile: 70 y.o. male   admitted on 12/30/2015 admitted with acute right upper quadrant and epigastric pain with nausea and vomiting.  PMH significant for PTSD, COPD, chronic pain, pancreatitis  Patient has a history of pancreatitis in the past, and on evaluation here in the ED was found to have an elevated lipase.   IMPRESSION: 1. Findings are compatible with severe acute pancreatitis. Given the presence of areas of high attenuation in the pancreatic parenchyma, some degree of intrapancreatic hemorrhage is suspected, particularly in the head and body of the pancreas. 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 2. Splenomegaly. 3. Aortic atherosclerosis.  Continued physical, functional and cognitive decline within the context of treatment, currently on TPN   Clinical Assessment and Goals of Care:  This NP Lorinda Creed reviewed medical records, received report from team, assessed the patient and then meet at the patient's bedside along with his wife  to discuss diagnosis, prognosis, GOC, EOL wishes disposition and options.  Patient is unable to participate in conversation 2/2 to altered mental status.   A detailed discussion was had today regarding advanced directives.  Concepts specific to code status, artifical feeding and hydration, continued IV antibiotics and rehospitalization was had.  The difference between a aggressive medical intervention path  and a palliative comfort care path for this patient at this time was had.  Values and goals of care  important to patient and family were attempted to be elicited.   Concept of Hospice and Palliative Care were discussed.  Wife has been a long Engineer, maintenance for Hospice.  Natural trajectory and expectations at EOL were discussed.  Questions and concerns addressed.   Family encouraged to call with questions or concerns.  PMT will continue to support holistically.  Patient's son is coming in from out of town tomorrow and wife/Linda Mizrahi wants to include him in any decision to de-escalate care and shift to comfort.   SUMMARY OF RECOMMENDATIONS    Code Status/Advance Care Planning:  Full code- encouraged family to DNR status knowing poor outcomes in similar  patients.  Clarify if patient has ICD and recommend deactivation.    Palliative Prophylaxis:   Aspiration, Delirium Protocol, Frequent Pain Assessment and Oral Care   Psycho-social/Spiritual:    Additional Recommendations: Education on Hospice  Prognosis:   < 4 weeks, dependant on desire for life prolonging measures   Discharge Planning: To Be Determined      Primary Diagnoses: Present on Admission: . Chronic systolic heart failure (HCC) . Benign essential HTN . GERD (gastroesophageal reflux disease) . Acute on chronic pancreatitis (HCC) . COPD (chronic obstructive pulmonary disease) (HCC)   I have reviewed the medical record, interviewed the patient and  family, and examined the patient. The following aspects are pertinent.  Past Medical History:  Diagnosis Date  . Anxiety   . Cardiac defibrillator in place   . COPD (chronic obstructive pulmonary disease) (HCC)    On 2liter oxygen  . Depression   . Elevated lipids   . GERD (gastroesophageal reflux disease)   . Headache   . Hypertension   . Prostate enlargement   . PTSD (post-traumatic stress disorder)   . Shakes    Social History   Social History  . Marital status: Married    Spouse name: N/A  . Number of children: N/A  . Years of education: N/A     Social History Main Topics  . Smoking status: Former Smoker    Packs/day: 5.00    Years: 40.00    Types: Cigarettes    Quit date: 09/24/2007  . Smokeless tobacco: Never Used  . Alcohol use 0.0 oz/week     Comment: daily, beer and hard liquor  . Drug use: No  . Sexual activity: No   Other Topics Concern  . None   Social History Narrative  . None   Family History  Problem Relation Age of Onset  . Heart disease Mother   . Heart disease Father   . Lymphoma Father    Scheduled Meds: . amLODipine  10 mg Oral Daily  . carvedilol  12.5 mg Oral BID WC  . enoxaparin (LOVENOX) injection  40 mg Subcutaneous QHS  . famotidine  20 mg Oral BID  . furosemide  20 mg Intravenous Daily  . insulin aspart  0-20 Units Subcutaneous Q4H  . ipratropium-albuterol  3 mL Nebulization TID  . lipase/protease/amylase  72,000 Units Oral TID AC  . mometasone-formoterol  2 puff Inhalation BID  . nystatin  5 mL Oral QID  . pantoprazole  40 mg Oral QAC breakfast  . potassium chloride  10 mEq Intravenous Q1 Hr x 4  . potassium chloride  40 mEq Oral Daily  . predniSONE  5 mg Oral Q breakfast  . QUEtiapine  50 mg Oral QHS  . sertraline  100 mg Oral BID  . tamsulosin  0.4 mg Oral Daily   Continuous Infusions: . Marland KitchenTPN (CLINIMIX-E) Adult 75 mL/hr at 01/08/16 1812   PRN Meds:.acetaminophen **OR** acetaminophen, albuterol, heparin lock flush, HYDROcodone-acetaminophen, LORazepam, morphine injection, ondansetron **OR** ondansetron (ZOFRAN) IV, sodium chloride flush, traZODone Medications Prior to Admission:  Prior to Admission medications   Medication Sig Start Date End Date Taking? Authorizing Provider  albuterol (PROVENTIL HFA;VENTOLIN HFA) 108 (90 BASE) MCG/ACT inhaler Inhale 2 puffs into the lungs 2 (two) times daily.    Yes Historical Provider, MD  amLODipine (NORVASC) 10 MG tablet Take 10 mg by mouth daily.   Yes Historical Provider, MD  benztropine (COGENTIN) 0.5 MG tablet Take 0.5 mg by mouth daily.    Yes Historical Provider, MD  budesonide-formoterol (SYMBICORT) 160-4.5 MCG/ACT inhaler Inhale 2 puffs into the lungs 2 (two) times daily.    Yes Historical Provider, MD  carvedilol (COREG) 12.5 MG tablet Take 12.5 mg by mouth 2 (two) times daily.    Yes Historical Provider, MD  fenofibrate 160 MG tablet Take 160 mg by mouth daily.   Yes Historical Provider, MD  nystatin (MYCOSTATIN) 100000 UNIT/ML suspension Take 5 mLs by mouth 4 (four) times daily. Swish and swallow   Yes Historical Provider, MD  predniSONE (DELTASONE) 10 MG tablet Take 10 mg by mouth daily. 09/20/15  Yes Historical Provider, MD  prochlorperazine (  COMPAZINE) 5 MG tablet Take 5 mg by mouth 3 (three) times daily as needed for nausea or vomiting.    Yes Historical Provider, MD  RABEprazole (ACIPHEX) 20 MG tablet Take 20 mg by mouth daily.   Yes Historical Provider, MD  roflumilast (DALIRESP) 500 MCG TABS tablet Take 500 mcg by mouth daily.   Yes Historical Provider, MD  sertraline (ZOLOFT) 100 MG tablet Take 100 mg by mouth 2 (two) times daily. Reported on 07/17/2015   Yes Historical Provider, MD  tamsulosin (FLOMAX) 0.4 MG CAPS capsule Take 1 capsule (0.4 mg total) by mouth daily. Patient taking differently: Take 0.4 mg by mouth 2 (two) times daily.  07/09/15  Yes Crist Fat, MD  tiotropium (SPIRIVA) 18 MCG inhalation capsule Place 18 mcg into inhaler and inhale daily.   Yes Historical Provider, MD  traMADol (ULTRAM) 50 MG tablet Take 1 tablet (50 mg total) by mouth every 6 (six) hours as needed. 07/17/15  Yes Yevette Edwards, MD  ziprasidone (GEODON) 60 MG capsule Take 60 mg by mouth at bedtime.   Yes Historical Provider, MD  acetaminophen (TYLENOL) 500 MG tablet Take 500 mg by mouth every 6 (six) hours as needed for mild pain or headache.    Historical Provider, MD  albuterol (PROVENTIL) (2.5 MG/3ML) 0.083% nebulizer solution Take 2.5 mg by nebulization every 6 (six) hours as needed for wheezing or shortness of breath.     Historical Provider, MD   Allergies  Allergen Reactions  . Contrast Media [Iodinated Diagnostic Agents] Anaphylaxis    Angioedema  . Aspirin Other (See Comments) and Itching    Pt is unable to take this medication.    . Ciprofloxacin Hcl Other (See Comments)    Reaction:  Unknown   . Ibuprofen Other (See Comments) and Itching    Pt is unable to take this medication.    . Metronidazole Other (See Comments)    Reaction:  Unknown   . Naproxen Other (See Comments)    Pt is unable to take this medication.    . Oxycodone Itching  . Tetracyclines & Related Other (See Comments)    Reaction:  Unknown   . Ciprofloxacin Rash  . Nitrofurantoin Rash  . Sulfa Antibiotics Rash  . Tetracycline Rash   Review of Systems  Unable to perform ROS: Acuity of condition    Physical Exam  Constitutional: He appears lethargic. Nasal cannula in place.  Cardiovascular: Normal rate, regular rhythm and normal heart sounds.   Pulmonary/Chest: He has decreased breath sounds in the right lower field and the left lower field.  Neurological: He appears lethargic.  Skin: Skin is warm and dry.    Vital Signs: BP (!) 115/49   Pulse 83   Temp 99 F (37.2 C) (Oral)   Resp 18   Ht 5\' 5"  (1.651 m)   Wt 74.8 kg (165 lb)   SpO2 100%   BMI 27.46 kg/m  Pain Assessment: No/denies pain POSS *See Group Information*: 1-Acceptable,Awake and alert Pain Score: 9    SpO2: SpO2: 100 % O2 Device:SpO2: 100 % O2 Flow Rate: .O2 Flow Rate (L/min): 3 L/min  IO: Intake/output summary:  Intake/Output Summary (Last 24 hours) at 01/09/16 1138 Last data filed at 01/09/16 0400  Gross per 24 hour  Intake              735 ml  Output                0 ml  Net  735 ml    LBM: Last BM Date: 01/08/16 Baseline Weight: Weight: 68.9 kg (152 lb) Most recent weight: Weight: 74.8 kg (165 lb)      Palliative Assessment/Data: 30% at best   Discussed with Dr Nemiah CommanderKalisetti  Time In: 1145 Time Out: 1300 Time Total: 75  min Greater than 50%  of this time was spent counseling and coordinating care related to the above assessment and plan.  Signed by: Lorinda CreedLARACH, Keilyn Nadal, NP   Please contact Palliative Medicine Team phone at [phone] for questions and concerns.  For individual provider: See Loretha StaplerAmion

## 2016-01-10 LAB — CBC
HEMATOCRIT: 29.7 % — AB (ref 40.0–52.0)
HEMOGLOBIN: 9.9 g/dL — AB (ref 13.0–18.0)
MCH: 28.9 pg (ref 26.0–34.0)
MCHC: 33.3 g/dL (ref 32.0–36.0)
MCV: 86.9 fL (ref 80.0–100.0)
Platelets: 161 10*3/uL (ref 150–440)
RBC: 3.42 MIL/uL — AB (ref 4.40–5.90)
RDW: 15.1 % — ABNORMAL HIGH (ref 11.5–14.5)
WBC: 8.3 10*3/uL (ref 3.8–10.6)

## 2016-01-10 LAB — PHOSPHORUS: PHOSPHORUS: 3.7 mg/dL (ref 2.5–4.6)

## 2016-01-10 LAB — BASIC METABOLIC PANEL
Anion gap: 8 (ref 5–15)
BUN: 34 mg/dL — AB (ref 6–20)
CO2: 22 mmol/L (ref 22–32)
Calcium: 8.4 mg/dL — ABNORMAL LOW (ref 8.9–10.3)
Chloride: 107 mmol/L (ref 101–111)
Creatinine, Ser: 0.9 mg/dL (ref 0.61–1.24)
GLUCOSE: 155 mg/dL — AB (ref 65–99)
Potassium: 3.3 mmol/L — ABNORMAL LOW (ref 3.5–5.1)
Sodium: 137 mmol/L (ref 135–145)

## 2016-01-10 LAB — MAGNESIUM: Magnesium: 2 mg/dL (ref 1.7–2.4)

## 2016-01-10 LAB — GLUCOSE, CAPILLARY
GLUCOSE-CAPILLARY: 169 mg/dL — AB (ref 65–99)
GLUCOSE-CAPILLARY: 152 mg/dL — AB (ref 65–99)

## 2016-01-10 MED ORDER — QUETIAPINE FUMARATE 50 MG PO TABS: 50.0000 mg | ORAL_TABLET | Freq: Every day | ORAL | 2 refills | Status: AC

## 2016-01-10 MED ORDER — LORAZEPAM 1 MG PO TABS
1.0000 mg | ORAL_TABLET | ORAL | 0 refills | Status: AC | PRN
Start: 2016-01-10 — End: ?

## 2016-01-10 MED ORDER — POTASSIUM CHLORIDE 10 MEQ/100ML IV SOLN
10.0000 meq | INTRAVENOUS | Status: AC
  Filled 2016-01-10 (×4): qty 100

## 2016-01-10 MED ORDER — MORPHINE SULFATE (CONCENTRATE) 20 MG/ML PO SOLN: 10.0000 mg | ORAL | 0 refills | Status: AC | PRN

## 2016-01-10 NOTE — Plan of Care (Signed)
MD making rounds. Received order to discharge home with Hospice. Plan for discharge once defibrillator is deactivated. Prescriptions given to wife. Prescription E-Scribed to pharmacy. Discharge paperwork provided, explained to wife, signed and witnessed. No unanswered questions. EMS paged for transport. Spoke with EMS dispatcher twice regarding ETA, updated wife on ETA, updated Hospice representative on ETA. EMS on site for transport. Transport packet given to EMS transporter. Belongings sent with wife.

## 2016-01-10 NOTE — Consult Note (Signed)
PARENTERAL NUTRITION CONSULT NOTE - INITIAL  Pharmacy Consult for electrolytes/glucose Indication: TPN  Allergies  Allergen Reactions  . Contrast Media [Iodinated Diagnostic Agents] Anaphylaxis    Angioedema  . Aspirin Other (See Comments) and Itching    Pt is unable to take this medication.    . Ciprofloxacin Hcl Other (See Comments)    Reaction:  Unknown   . Ibuprofen Other (See Comments) and Itching    Pt is unable to take this medication.    . Metronidazole Other (See Comments)    Reaction:  Unknown   . Naproxen Other (See Comments)    Pt is unable to take this medication.    . Oxycodone Itching  . Tetracyclines & Related Other (See Comments)    Reaction:  Unknown   . Ciprofloxacin Rash  . Nitrofurantoin Rash  . Sulfa Antibiotics Rash  . Tetracycline Rash    Patient Measurements: Height: 5\' 5"  (165.1 cm) Weight: 167 lb (75.8 kg) IBW/kg (Calculated) : 61.5 Adjusted Body Weight:  Usual Weight:   Vital Signs: Temp: 98 F (36.7 C) (09/08 0850) Temp Source: Oral (09/08 0850) BP: 123/51 (09/08 0850) Pulse Rate: 86 (09/08 0850) Intake/Output from previous day: 09/07 0701 - 09/08 0700 In: 870 [I.V.:870] Out: 850 [Urine:850] Intake/Output from this shift: No intake/output data recorded.  Labs:  Recent Labs  01/10/16 0558  WBC 8.3  HGB 9.9*  HCT 29.7*  PLT 161     Recent Labs  01/08/16 0441 01/09/16 0419 01/10/16 0558  NA 138 138 137  K 3.6 3.1* 3.3*  CL 103 106 107  CO2 26 24 22   GLUCOSE 183* 168* 155*  BUN 29* 38* 34*  CREATININE 1.10 1.18 0.90  CALCIUM 8.8* 8.5* 8.4*  MG 2.1 2.1 2.0  PHOS 3.1 3.6 3.7  PROT 6.1*  --   --   ALBUMIN 2.9*  --   --   AST 37  --   --   ALT 13*  --   --   ALKPHOS 77  --   --   BILITOT 1.6*  --   --    Estimated Creatinine Clearance: 70.5 mL/min (by C-G formula based on SCr of 0.9 mg/dL).    Recent Labs  01/09/16 2331 01/10/16 0346 01/10/16 0643  GLUCAP 164* 169* 152*    Medical History: Past Medical  History:  Diagnosis Date  . Anxiety   . Cardiac defibrillator in place   . COPD (chronic obstructive pulmonary disease) (HCC)    On 2liter oxygen  . Depression   . Elevated lipids   . GERD (gastroesophageal reflux disease)   . Headache   . Hypertension   . Prostate enlargement   . PTSD (post-traumatic stress disorder)   . Shakes     Medications:  Scheduled:  . amLODipine  10 mg Oral Daily  . carvedilol  12.5 mg Oral BID WC  . enoxaparin (LOVENOX) injection  40 mg Subcutaneous QHS  . famotidine  20 mg Oral BID  . furosemide  20 mg Intravenous Daily  . insulin aspart  0-20 Units Subcutaneous Q4H  . ipratropium-albuterol  3 mL Nebulization TID  . lipase/protease/amylase  72,000 Units Oral TID AC  . mometasone-formoterol  2 puff Inhalation BID  . nystatin  5 mL Oral QID  . pantoprazole  40 mg Oral QAC breakfast  . predniSONE  5 mg Oral Q breakfast  . QUEtiapine  50 mg Oral QHS  . sertraline  100 mg Oral BID  . tamsulosin  0.4 mg Oral Daily    Insulin Requirements in the past 24 hours:  9/6 0700-9/7 0659 about 33 units (24 units in SSI and about 9 units from TPN bag)  Current Nutrition:  Clinimix E 5/20 @ 58ml/hr  Assessment: K=3.3, all other electrolytes are WNL. Not able to tolerate oral KCl  Plan:  Patient do discharge to hospice, will not continue TPN. No additional follow up needed.  Garlon Hatchet, PharmD Clinical Pharmacist  01/10/2016,2:57 PM

## 2016-01-10 NOTE — Discharge Summary (Signed)
[facility]   PATIENT NAME: Robert Ball    MR#:  [id number]  DATE OF BIRTH:  11-30-1945  DATE OF ADMISSION:  12/30/2015   ADMITTING PHYSICIAN: Oralia Manis, MD  DATE OF DISCHARGE: 01/10/2016  PRIMARY CARE PHYSICIAN: Mickey Farber, MD   ADMISSION DIAGNOSIS:   Hypokalemia [E87.6] Acute pancreatitis, unspecified pancreatitis type [K85.9] Abdominal pain, unspecified abdominal location [R10.9]  DISCHARGE DIAGNOSIS:   Principal Problem:   Acute on chronic pancreatitis (HCC) Active Problems:   Chronic systolic heart failure (HCC)   COPD (chronic obstructive pulmonary disease) (HCC)   Benign essential HTN   GERD (gastroesophageal reflux disease)   Protein-calorie malnutrition, severe   DNR (do not resuscitate) discussion   Palliative care by specialist   Transient alteration of awareness   SECONDARY DIAGNOSIS:   Past Medical History:  Diagnosis Date  . Anxiety   . Cardiac defibrillator in place   . COPD (chronic obstructive pulmonary disease) (HCC)    On 2liter oxygen  . Depression   . Elevated lipids   . GERD (gastroesophageal reflux disease)   . Headache   . Hypertension   . Prostate enlargement   . PTSD (post-traumatic stress disorder)   . Shakes     HOSPITAL COURSE:   70 year old male with past medical history significant for COPD on home oxygen, CAD, cardiac defibrillator, hyperlipidemia presented to the hospital secondary to acute on chronic pancreatitis  #1 acute on chronic pancreatitis-unresolving and severe. -Significant clinical and functional decline in the last 10 days since admission. Repeat CT of the abdomen still showing severe acute pancreatitis. -Patient remains on TPN. Due to progressive decline, family requested for hospice. -Palliative care consult appreciated. Discussed with patient's wife and also son today and had a family meeting. Everybody in agreement with stopping TPN and discharged home with  hospice services. -Continue pain medications. If patient is alert enough and requests food, he can have food for pleasure.  #2 metabolic encephalopathy-CT of the head without any acute findings. Started on Seroquel at bedtime. Worsening mental status.  #3 COPD continue 3 L home oxygen. Has endstage COPD.  #4 cardiomyopathy status post defibrillator-discussed with cardiology, defibrillator will be deactivated prior to discharge.  #5 hypertension-all medications are being stopped at the time of discharge.  Patient will be discharged home with hospice services Poor prognosis  DISCHARGE CONDITIONS:   Critical CONSULTS OBTAINED:   Treatment Team:  Christena Deem, MD  DRUG ALLERGIES:   Allergies  Allergen Reactions  . Contrast Media [Iodinated Diagnostic Agents] Anaphylaxis    Angioedema  . Aspirin Other (See Comments) and Itching    Pt is unable to take this medication.    . Ciprofloxacin Hcl Other (See Comments)    Reaction:  Unknown   . Ibuprofen Other (See Comments) and Itching    Pt is unable to take this medication.    . Metronidazole Other (See Comments)    Reaction:  Unknown   . Naproxen Other (See Comments)    Pt is unable to take this medication.    . Oxycodone Itching  . Tetracyclines & Related Other (See Comments)    Reaction:  Unknown   . Ciprofloxacin Rash  . Nitrofurantoin Rash  . Sulfa Antibiotics Rash  . Tetracycline Rash   DISCHARGE MEDICATIONS:     Medication List    STOP taking these medications   amLODipine 10 MG tablet Commonly known as:  NORVASC   benztropine 0.5 MG tablet Commonly known  as:  COGENTIN   carvedilol 12.5 MG tablet Commonly known as:  COREG   fenofibrate 160 MG tablet   nystatin 100000 UNIT/ML suspension Commonly known as:  MYCOSTATIN   predniSONE 10 MG tablet Commonly known as:  DELTASONE   prochlorperazine 5 MG tablet Commonly known as:  COMPAZINE   roflumilast 500 MCG Tabs tablet Commonly known as:   DALIRESP   SYMBICORT 160-4.5 MCG/ACT inhaler Generic drug:  budesonide-formoterol   tamsulosin 0.4 MG Caps capsule Commonly known as:  FLOMAX   tiotropium 18 MCG inhalation capsule Commonly known as:  SPIRIVA   traMADol 50 MG tablet Commonly known as:  ULTRAM   ziprasidone 60 MG capsule Commonly known as:  GEODON     TAKE these medications   acetaminophen 500 MG tablet Commonly known as:  TYLENOL Take 500 mg by mouth every 6 (six) hours as needed for mild pain or headache.   albuterol (2.5 MG/3ML) 0.083% nebulizer solution Commonly known as:  PROVENTIL Take 2.5 mg by nebulization every 6 (six) hours as needed for wheezing or shortness of breath. What changed:  Another medication with the same name was removed. Continue taking this medication, and follow the directions you see here.   LORazepam 1 MG tablet Commonly known as:  ATIVAN Take 1 tablet (1 mg total) by mouth every 4 (four) hours as needed for anxiety.   morphine 20 MG/ML concentrated solution Commonly known as:  ROXANOL Take 0.5 mLs (10 mg total) by mouth every 2 (two) hours as needed for moderate pain, severe pain or shortness of breath.   QUEtiapine 50 MG tablet Commonly known as:  SEROQUEL Take 1 tablet (50 mg total) by mouth at bedtime.   RABEprazole 20 MG tablet Commonly known as:  ACIPHEX Take 20 mg by mouth daily.   sertraline 100 MG tablet Commonly known as:  ZOLOFT Take 100 mg by mouth 2 (two) times daily. Reported on 07/17/2015        DISCHARGE INSTRUCTIONS:   Will be discharged home with hospice services  DIET:   As tolerated  ACTIVITY:   Bedrest  OXYGEN:   Home Oxygen: Yes.    Oxygen Delivery: 3 liters/min via Patient connected to nasal cannula oxygen  DISCHARGE LOCATION:   home with hospice services  If you experience worsening of your admission symptoms, develop shortness of breath, life threatening emergency, suicidal or homicidal thoughts you must seek medical attention  immediately by calling 911 or calling your MD immediately  if symptoms less severe.  You Must read complete instructions/literature along with all the possible adverse reactions/side effects for all the Medicines you take and that have been prescribed to you. Take any new Medicines after you have completely understood and accpet all the possible adverse reactions/side effects.   Please note  You were cared for by a hospitalist during your hospital stay. If you have any questions about your discharge medications or the care you received while you were in the hospital after you are discharged, you can call the unit and asked to speak with the hospitalist on call if the hospitalist that took care of you is not available. Once you are discharged, your primary care physician will handle any further medical issues. Please note that NO REFILLS for any discharge medications will be authorized once you are discharged, as it is imperative that you return to your primary care physician (or establish a relationship with a primary care physician if you do not have one) for your aftercare needs  so that they can reassess your need for medications and monitor your lab values.    On the day of Discharge:  VITAL SIGNS:   Blood pressure (!) 123/51, pulse 86, temperature 98 F (36.7 C), temperature source Oral, resp. rate 20, height 5\' 5"  (1.651 m), weight 75.8 kg (167 lb), SpO2 100 %.  PHYSICAL EXAMINATION:    GENERAL:  70 y.o.-year-old patient lying in the bed with no acute distress. Critically ill appearing. EYES: Pupils equal, round, reactive to light and accommodation. No scleral icterus. Extraocular muscles intact.  HEENT: Head atraumatic, normocephalic. Oropharynx and nasopharynx clear. Very dry mucous membranes. Thrush in the mouth noted. NECK:  Supple, no jugular venous distention. No thyroid enlargement, no tenderness.  LUNGS: Normal breath sounds bilaterally, no wheezing, rales,rhonchi or crepitation.  No use of accessory muscles of respiration. Decreased bibasilar breath sounds.  CARDIOVASCULAR: S1, S2 normal. No murmurs, rubs, or gallops.  ABDOMEN: Soft, discomfort on palpation in the epigastric, right upper quadrant regions, no guarding or rigidity noted., nondistended. hypoactive Bowel sounds present. No organomegaly or mass.  EXTREMITIES: No  cyanosis, or clubbing. 1+ ankle edema noted NEUROLOGIC: Lethargic and arousable. Unable to do a complete neuro exam. Moving around in bed. Global weakness.  PSYCHIATRIC: The patient is lethargic, but nodding yes or no to simple questions.  SKIN: No obvious rash, lesion, or ulcer.     DATA REVIEW:   CBC  Recent Labs Lab 01/10/16 0558  WBC 8.3  HGB 9.9*  HCT 29.7*  PLT 161    Chemistries   Recent Labs Lab 01/08/16 0441  01/10/16 0558  NA 138  < > 137  K 3.6  < > 3.3*  CL 103  < > 107  CO2 26  < > 22  GLUCOSE 183*  < > 155*  BUN 29*  < > 34*  CREATININE 1.10  < > 0.90  CALCIUM 8.8*  < > 8.4*  MG 2.1  < > 2.0  AST 37  --   --   ALT 13*  --   --   ALKPHOS 77  --   --   BILITOT 1.6*  --   --   < > = values in this interval not displayed.   Microbiology Results  Results for orders placed or performed during the hospital encounter of 12/22/15  Urine culture     Status: Abnormal   Collection Time: 12/22/15 11:30 AM  Result Value Ref Range Status   Specimen Description URINE, CLEAN CATCH  Final   Special Requests NONE  Final   Culture (A)  Final    <10,000 COLONIES/mL INSIGNIFICANT GROWTH Performed at [facility]    Report Status 12/23/2015 FINAL  Final    RADIOLOGY:  No results found.   Management plans discussed with the patient, family and they are in agreement.  CODE STATUS:     Code Status Orders        Start     Ordered   01/10/16 0944  Do not attempt resuscitation (DNR)  Continuous    Question Answer Comment  In the event of cardiac or respiratory ARREST Do not call a "code blue"   In the  event of cardiac or respiratory ARREST Do not perform Intubation, CPR, defibrillation or ACLS   In the event of cardiac or respiratory ARREST Use medication by any route, position, wound care, and other measures to relive pain and suffering. May use oxygen, suction and manual treatment of airway obstruction as  needed for comfort.      01/10/16 [id number]    Code Status History    Date Active Date Inactive Code Status Order ID Comments User Context   12/31/2015 12:39 AM 01/10/2016  9:43 AM Full Code [id number]  Oralia Manisavid Willis, MD Inpatient   12/05/2015  7:29 PM 12/11/2015  8:11 PM Full Code [id number]  Gwendolyn FillBincy S Varughese, NP ED   04/09/2015 10:30 PM 04/11/2015  6:23 PM Full Code [id number]  Houston SirenVivek J Sainani, MD Inpatient   12/27/2014  3:17 PM 12/27/2014  7:47 PM Full Code [id number]  Marcina MillardAlexander Paraschos, MD Inpatient      TOTAL TIME TAKING CARE OF THIS PATIENT: 38 minutes.    Enid BaasKALISETTI,Lindzey Zent M.D on 01/10/2016 at 2:48 PM  Between 7am to 6pm - Pager - [phone]  After 6pm go to [email or web address] - Social research officer, governmentpassword [email or web address]  [facility]  Office  [phone]  CC: Primary care physician; Mickey FarberHIES, DAVID, MD   Note: This dictation was prepared with Dragon dictation along with smaller phrase technology. Any transcriptional errors that result from this process are unintentional.

## 2016-01-10 NOTE — Progress Notes (Signed)
Nutrition Follow-up  DOCUMENTATION CODES:   Severe malnutrition in context of acute illness/injury  INTERVENTION:  -Recommend to continue TPN of 5%AA/20% dextrose with electrolytes at 1175ml/hr at this time as pt continues to be NPO.   If aggressive care is continued will need 20% lipids starting Monday (9/11),  Wednesday, Friday at 6120ml/hr for 24 hr to better meet nutritional needs.     NUTRITION DIAGNOSIS:   Malnutrition related to acute illness as evidenced by percent weight loss, energy intake < or equal to 50% for > or equal to 5 days.    GOAL:   Patient will meet greater than or equal to 90% of their needs    MONITOR:   Diet advancement, Weight trends, Labs  REASON FOR ASSESSMENT:   Consult New TPN/TNA  ASSESSMENT:     TPN of 5%AA/20% dextrose infusing via PICC line at 7475ml/hr  Medications and labs reviewed  No edema charted in lower extremities  Diet Order:  Diet NPO time specified Except for: Sips with Meds .TPN (CLINIMIX-E) Adult  Skin:  Reviewed, no issues  Last BM:  9/4  Height:   Ht Readings from Last 1 Encounters:  12/30/15 5\' 5"  (1.651 m)    Weight:  Remaining stable  Wt Readings from Last 1 Encounters:  01/10/16 167 lb (75.8 kg)    Ideal Body Weight:     BMI:  Body mass index is 27.79 kg/m.  Estimated Nutritional Needs:   Kcal:  2040-2380 kcals/d  Protein:  82-102 g/d  Fluid:  >/=2 L  EDUCATION NEEDS:   No education needs identified at this time  Lanah Steines B. [name], RD, LDN [phone] (pager) Weekend/On-Call pager [phone])

## 2016-01-10 NOTE — Care Management (Signed)
Medical necessity completed 

## 2016-01-10 NOTE — Care Management (Signed)
Offered a choice of Hospice agencies and wife chooses [facility] [address]. Clydie BraunKaren with hospice notified.

## 2016-01-10 NOTE — Progress Notes (Signed)
New referral for [facility] services at home following discharge received from [facility]. Mr. Robert Ball is a 70 year old man with a known history of pancreatitis admitted to [facility] on 8/28 for evaluation of acute right upper quadrant pain. PMH includes COPD (oxygen dependent), PTSD, aortic stenosis, Cardiac defibrillator , HTN, anxiety and BPH. Mr. Robert Ball has had a continued decline in  Functional and cognitive decline despite medical interventions. He and his family met yesterday 9/7 with Palliative NP Wadie Lessen and again this morning with attending physician Dr. Tressia Miners they have chosen to stop any further treatments including his TPN and focus on his comfort with hospice services at home. Writer met in the room with Mrs. Schulenburg, patient was in and out of sleep through out the visit. Education was initiated regarding hospice services, philosophy and team approach to care with good understanding voiced. Mrs. Quesinberry is familiar with [facility] [place] services as her sister received services this year. Patient has no current DME needs at this time, he has both oxygen and a hospital bed in place at home. Plan is for discharge home today once his defibrillator is deactivated. Hospital care team aware. Signed portable DNR in place in discharge packet. Patient information faxed to hospice referral. Hpspice information and contact numbers left with Mrs. Chrissie Noa. Patient will require EMS transport at discharge. Thank you. Flo Shanks RN, BSN, [place] [facility] [place], [facility] [phone] c

## 2016-02-02 DEATH — deceased

## 2016-04-21 ENCOUNTER — Ambulatory Visit: Payer: Medicare PPO

## 2017-02-23 IMAGING — DX DG ABD PORTABLE 1V
2 series · 2 of 2 positions shown · non-contrast
Comparison: CT 12/05/2015

CLINICAL DATA: Encounter for nasogastric tube placement.

EXAM:
PORTABLE ABDOMEN - 1 VIEW

[abdomen kub (1 of 2)]
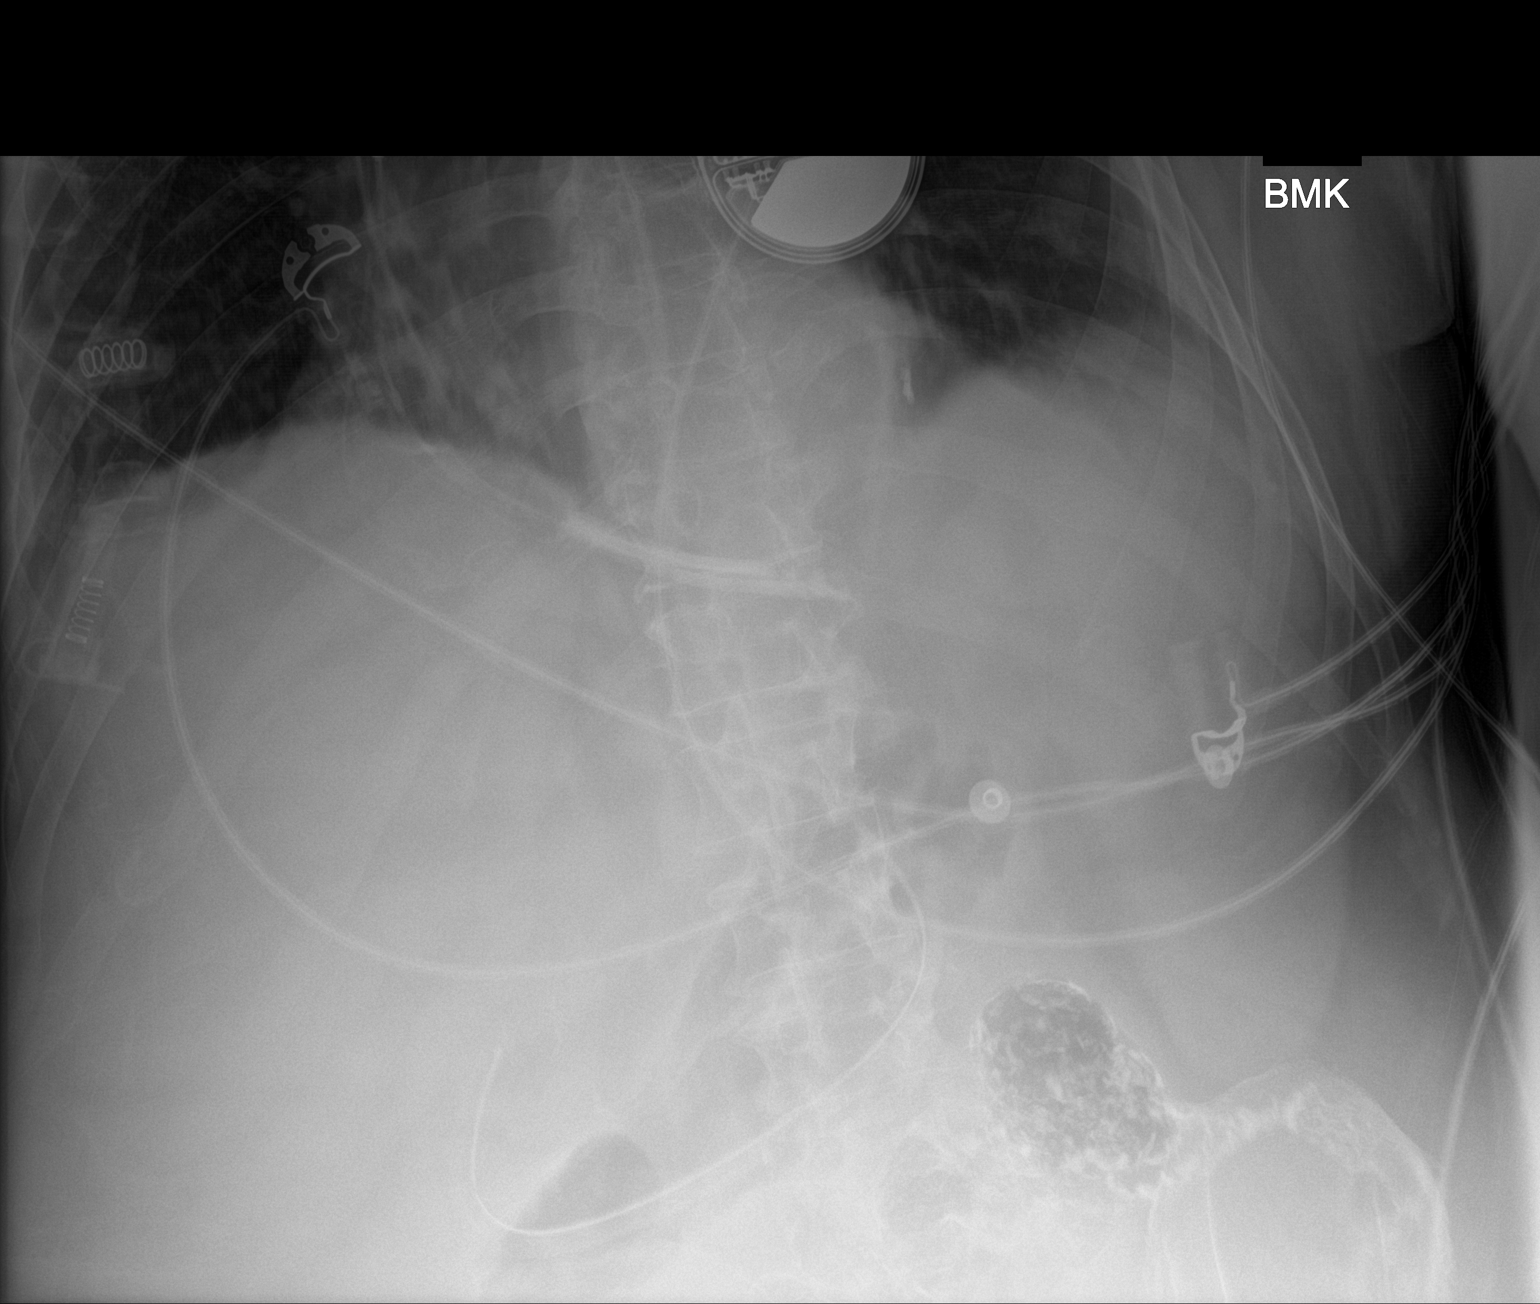

[abdomen kub (2 of 2)]
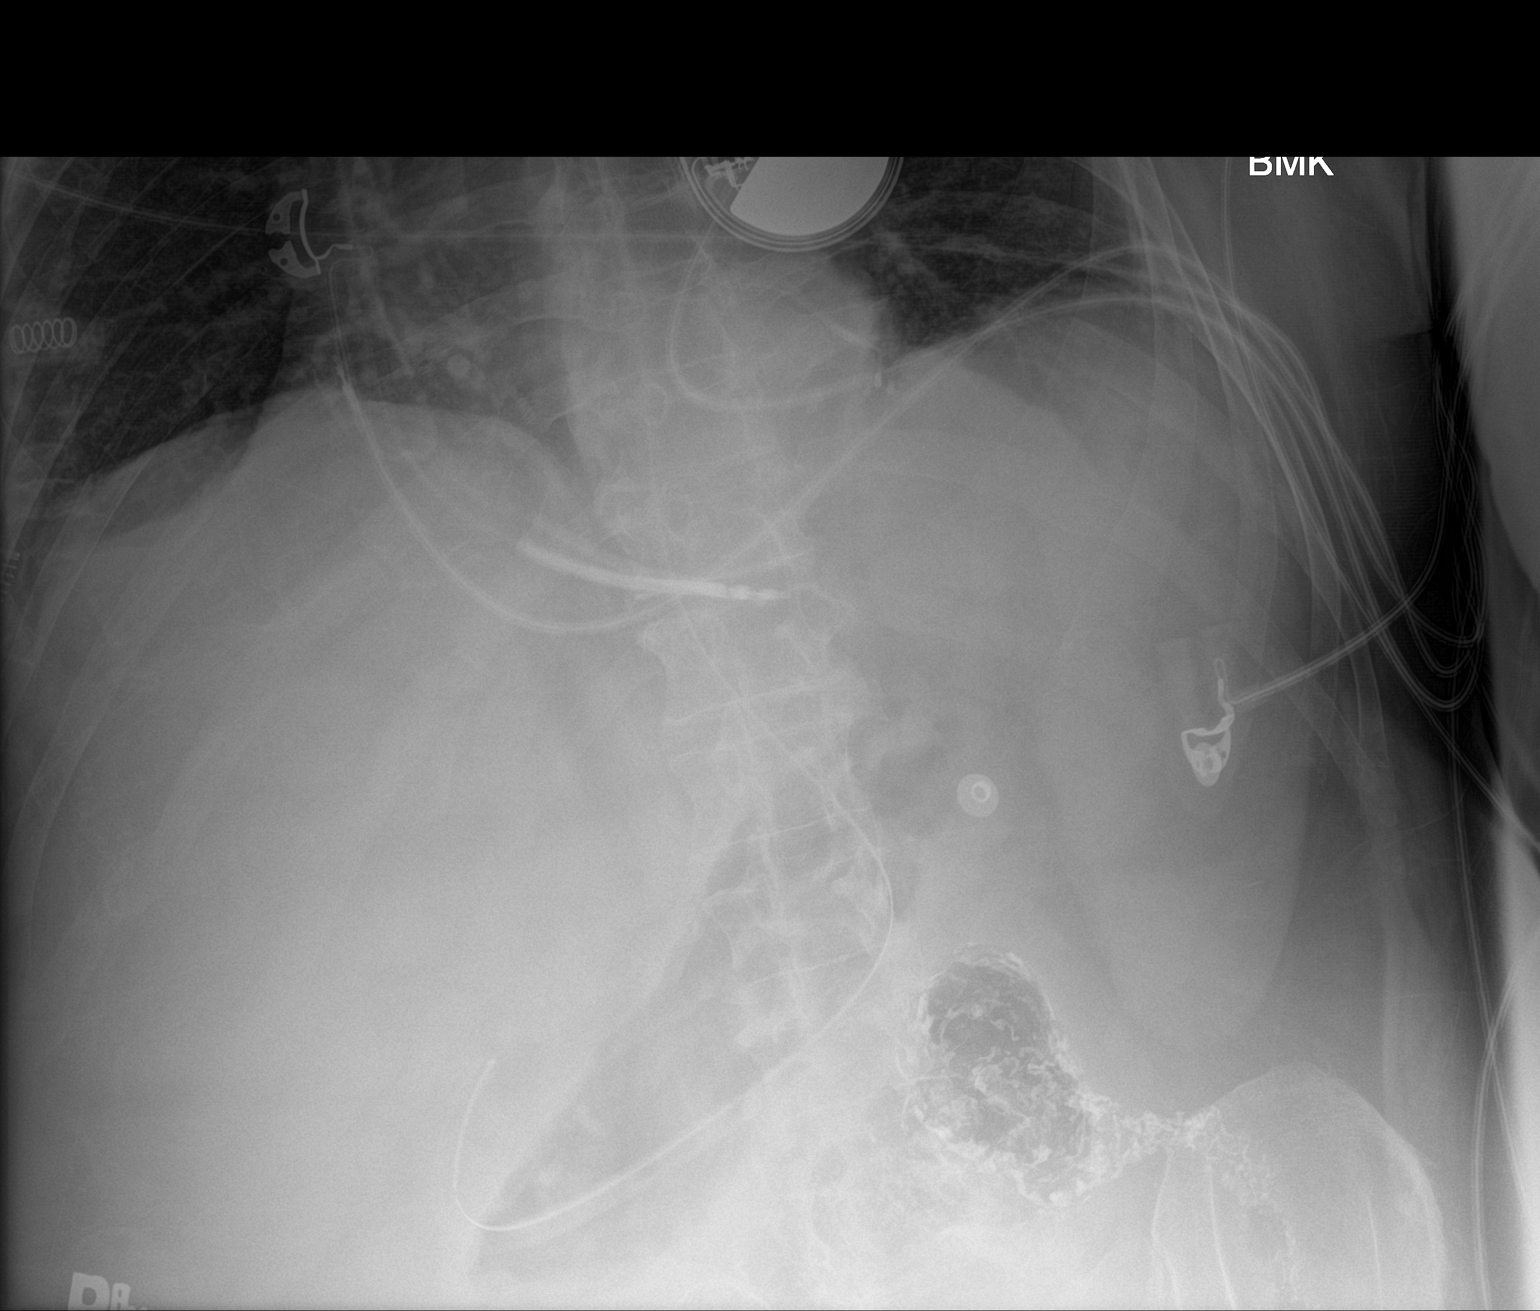

[2 of 2 positions shown; findings below may reference images not displayed]

FINDINGS: Tip and side port of the enteric tube below the diaphragm in the
stomach. No dilated bowel loops in the upper abdomen. There is
enteric contrast within colon in the left abdomen from prior CT.
IMPRESSION: Tip and side port of the enteric tube below the diaphragm in the
stomach.

## 2017-02-23 IMAGING — CT CT ABD-PELV W/ CM
2 of 5 series · 15 of 46 positions shown, 17 images · IV contrast (iopamidol)
Comparison: CT the abdomen and pelvis 04/09/2015.

CLINICAL DATA: 70-year-old male with history of chronic diarrhea
for the past 6 months. Generalized abdominal pain.

EXAM:
CT ABDOMEN AND PELVIS WITH CONTRAST
TECHNIQUE: Multidetector CT imaging of the abdomen and pelvis was performed
using the standard protocol following bolus administration of
intravenous contrast.
CONTRAST:  100mL [id number] IOPAMIDOL ([id number]) INJECTION 61%

[Series 2: axial soft tissue · axial · 0.86mm/px · z∈[-678,-293]mm · 12 of 87 slices shown, 14 images]
[im 5/87  soft-tissue]
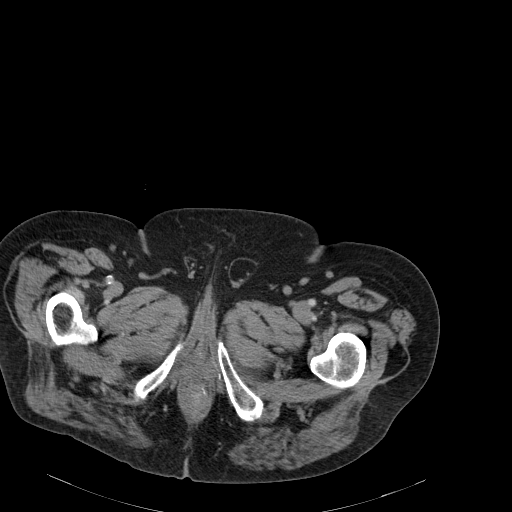
[im 5/87  bone]
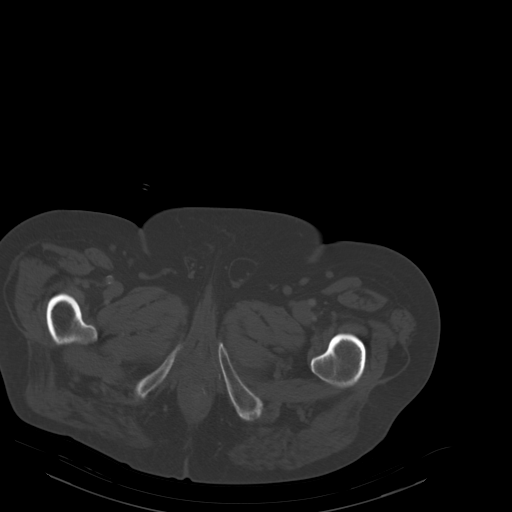
[im 15/87  soft-tissue]
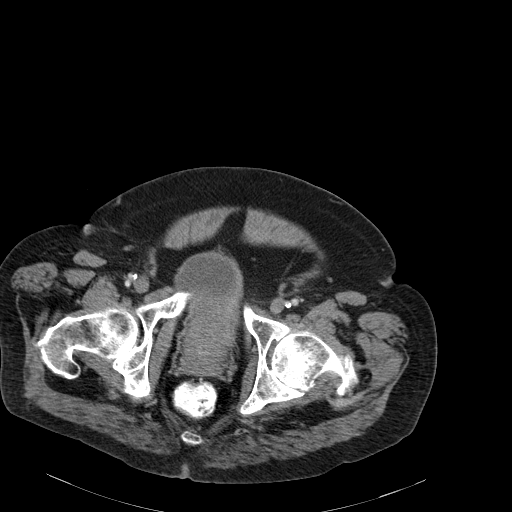
[im 20/87  soft-tissue]
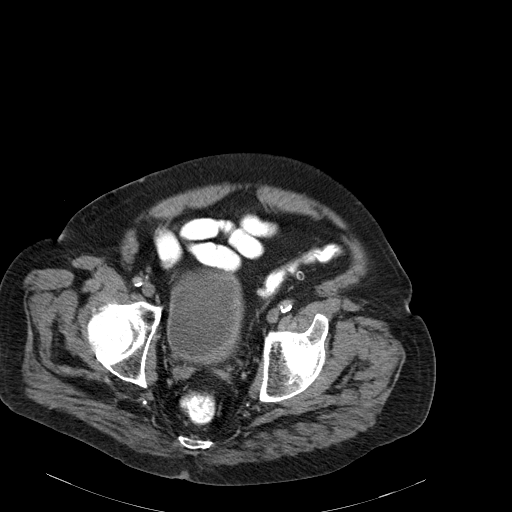
[im 24/87  soft-tissue]
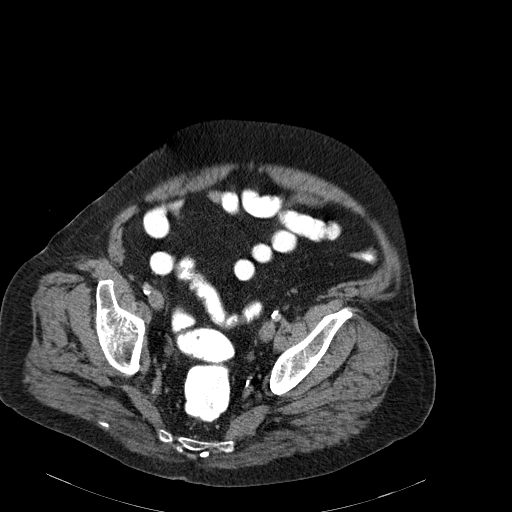
[im 34/87  soft-tissue]
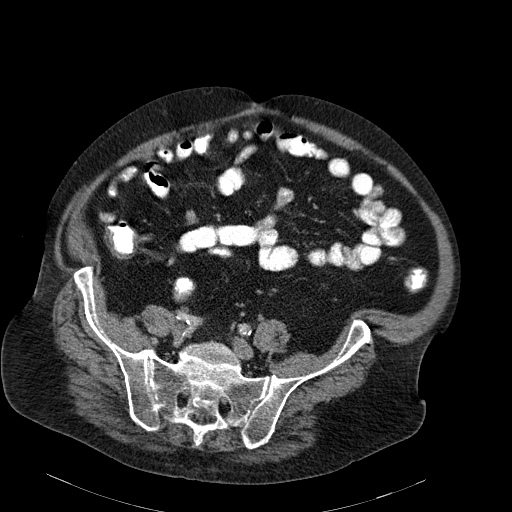
[im 39/87  soft-tissue]
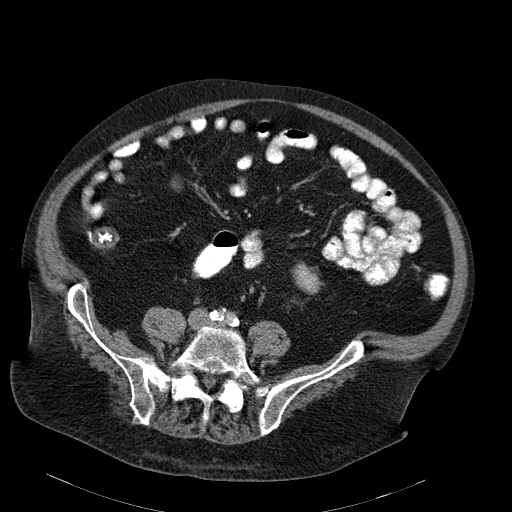
[im 48/87  soft-tissue]
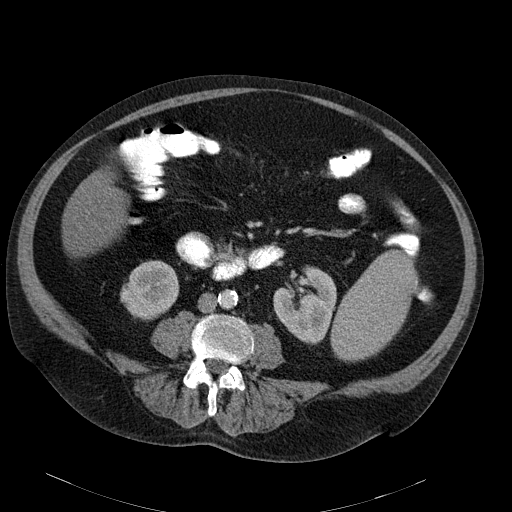
[im 53/87  soft-tissue]
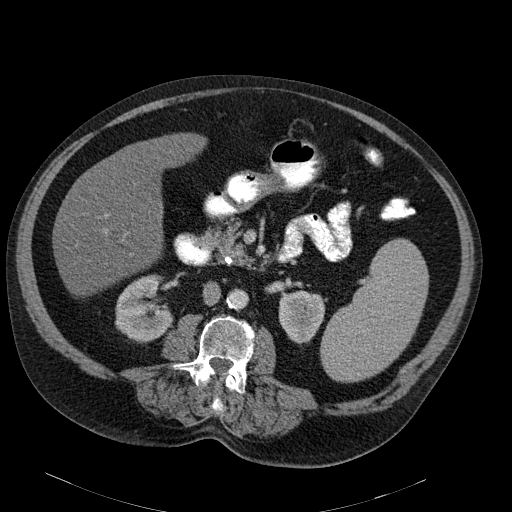
[im 63/87  soft-tissue]
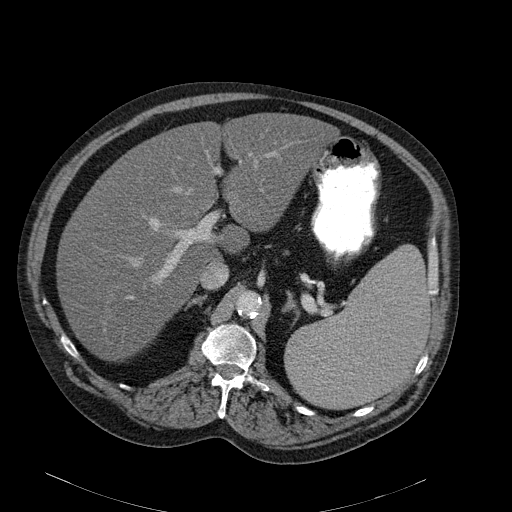
[im 63/87  bone]
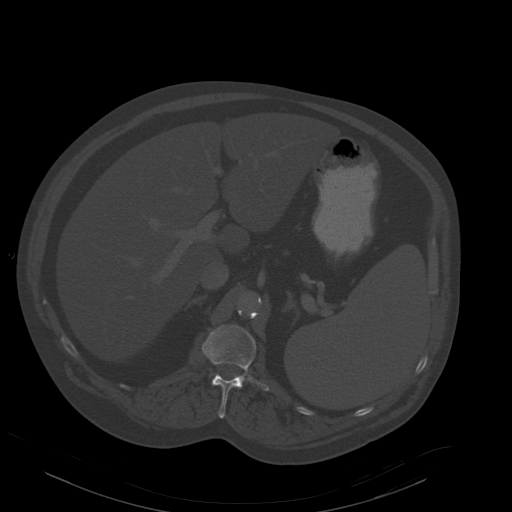
[im 67/87  soft-tissue]
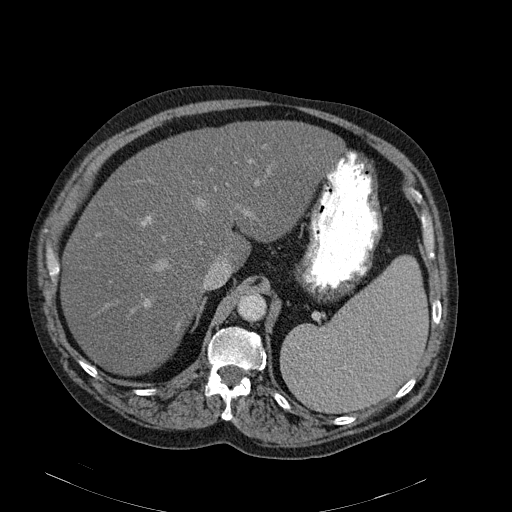
[im 72/87  soft-tissue]
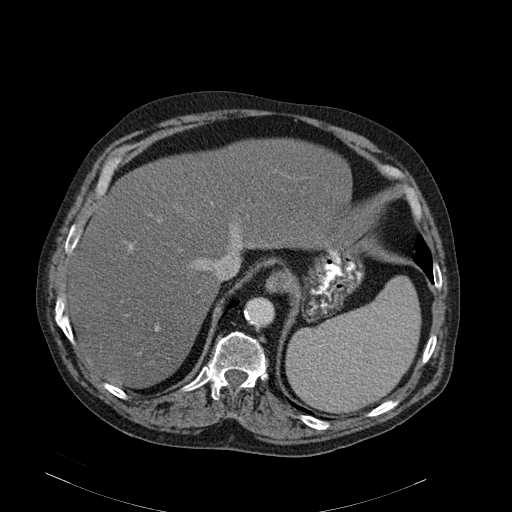
[im 82/87  soft-tissue]
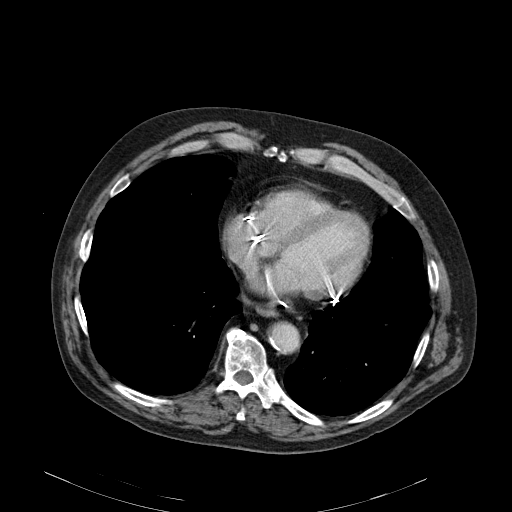

[Series 602: coronal · coronal · 0.86mm/px · 3 of 150 slices shown]
[im 50/150  soft-tissue]
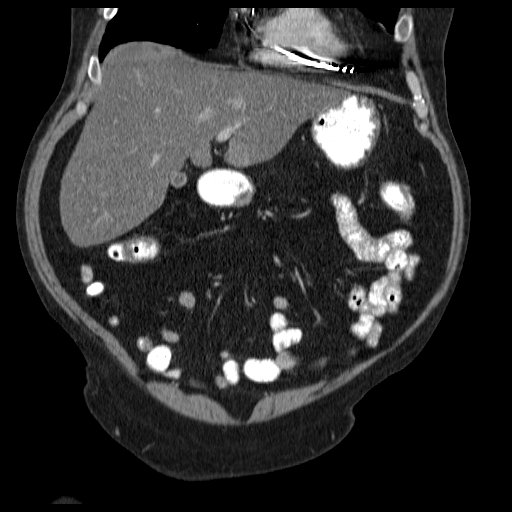
[im 67/150  soft-tissue]
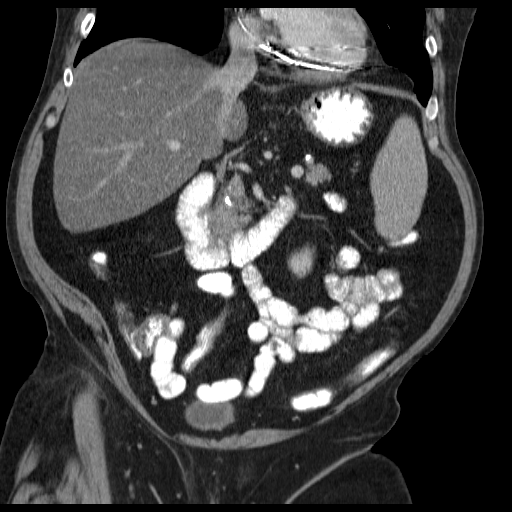
[im 83/150  soft-tissue]
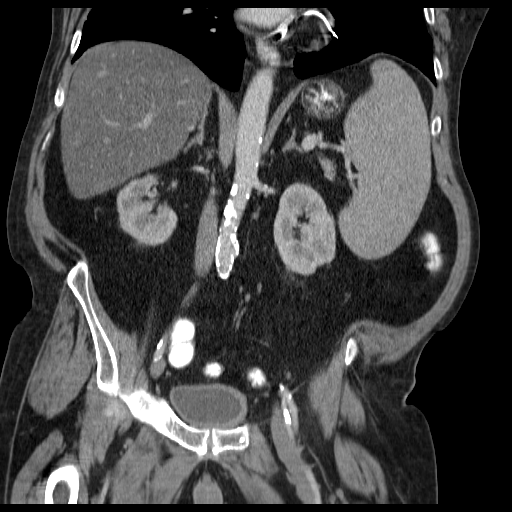

[15 of 46 positions shown; findings below may reference images not displayed]

FINDINGS: Lower chest: Pacemaker leads terminating in the right ventricular
apex, right atrial appendage, and overlying the lateral wall of the
left ventricle via the coronary sinus and coronary veins.

Hepatobiliary: Diffuse low attenuation throughout the hepatic
parenchyma, compatible with severe hepatic steatosis. No definite
cystic or solid hepatic lesions. No intra or extrahepatic biliary
ductal dilatation. Gallbladder is nearly decompressed, but otherwise
unremarkable in appearance.

Pancreas: Several coarse calcifications are noted in the head of the
pancreas, likely sequela of prior episodes of pancreatitis. No
discrete pancreatic mass. No pancreatic ductal dilatation. No
pancreatic or peripancreatic fluid or inflammatory changes.

Spleen: Spleen is enlarged measuring 16.1 x 7.7 x 17.8 cm (estimated
splenic volume of 1,103 mL).

Adrenals/Urinary Tract: Bilateral adrenal glands and bilateral
kidneys are normal in appearance. No hydroureteronephrosis or
perinephric stranding to indicate urinary tract obstruction at this
time. Urinary bladder wall is mildly thickened, somewhat
asymmetrically so involving predominantly the left side and
posterior aspect of the urinary bladder wall, best appreciated on
image 69 of series 2. No discrete bladder wall mass is confidently
identified.

Stomach/Bowel: Normal appearance of the stomach. No pathologic
dilatation of small bowel or colon. A few scattered colonic
diverticulae are noted, without surrounding inflammatory changes to
suggest an acute diverticulitis at this time. No regional areas of
mural thickening or surrounding inflammatory changes are seen in
associations with either the small bowel or colon. The appendix is
not confidently identified and may be surgically absent. Regardless,
there are no inflammatory changes noted adjacent to the cecum to
suggest the presence of an acute appendicitis at this time.

Vascular/Lymphatic: Aortic atherosclerosis, without evidence of
aneurysm or dissection in the abdominal or pelvic vasculature. No
lymphadenopathy noted in the abdomen or pelvis.

Reproductive: Prostate gland and seminal vesicles are unremarkable
in appearance.

Other: No significant volume of ascites.  No pneumoperitoneum.

Musculoskeletal: There are no aggressive appearing lytic or blastic
lesions noted in the visualized portions of the skeleton.
IMPRESSION: 1. No acute findings in the abdomen or pelvis to account for the
patient's symptoms.
2. Asymmetric thickening of the urinary bladder wall. This is
nonspecific, but somewhat unusual in appearance. While there is no
discrete urothelial lesion identified, nonemergent Urologic
consultation is suggested to exclude the possibility of infiltrative
bladder wall mass.
3. Mild colonic diverticulosis without evidence to suggest an acute
diverticulitis at this time.
4. Severe hepatic steatosis.
5. Aortic atherosclerosis.
6. Splenomegaly.
7. Additional incidental findings, as above.

## 2017-02-24 IMAGING — DX DG CHEST 1V PORT
1 series · 2 of 2 positions shown · non-contrast
Comparison: Yesterday

CLINICAL DATA: Acute respiratory failure

EXAM:
PORTABLE CHEST 1 VIEW

[Series 1: chest ap · 0.14mm/px · 2 of 2 slices shown]
[im 1/2]
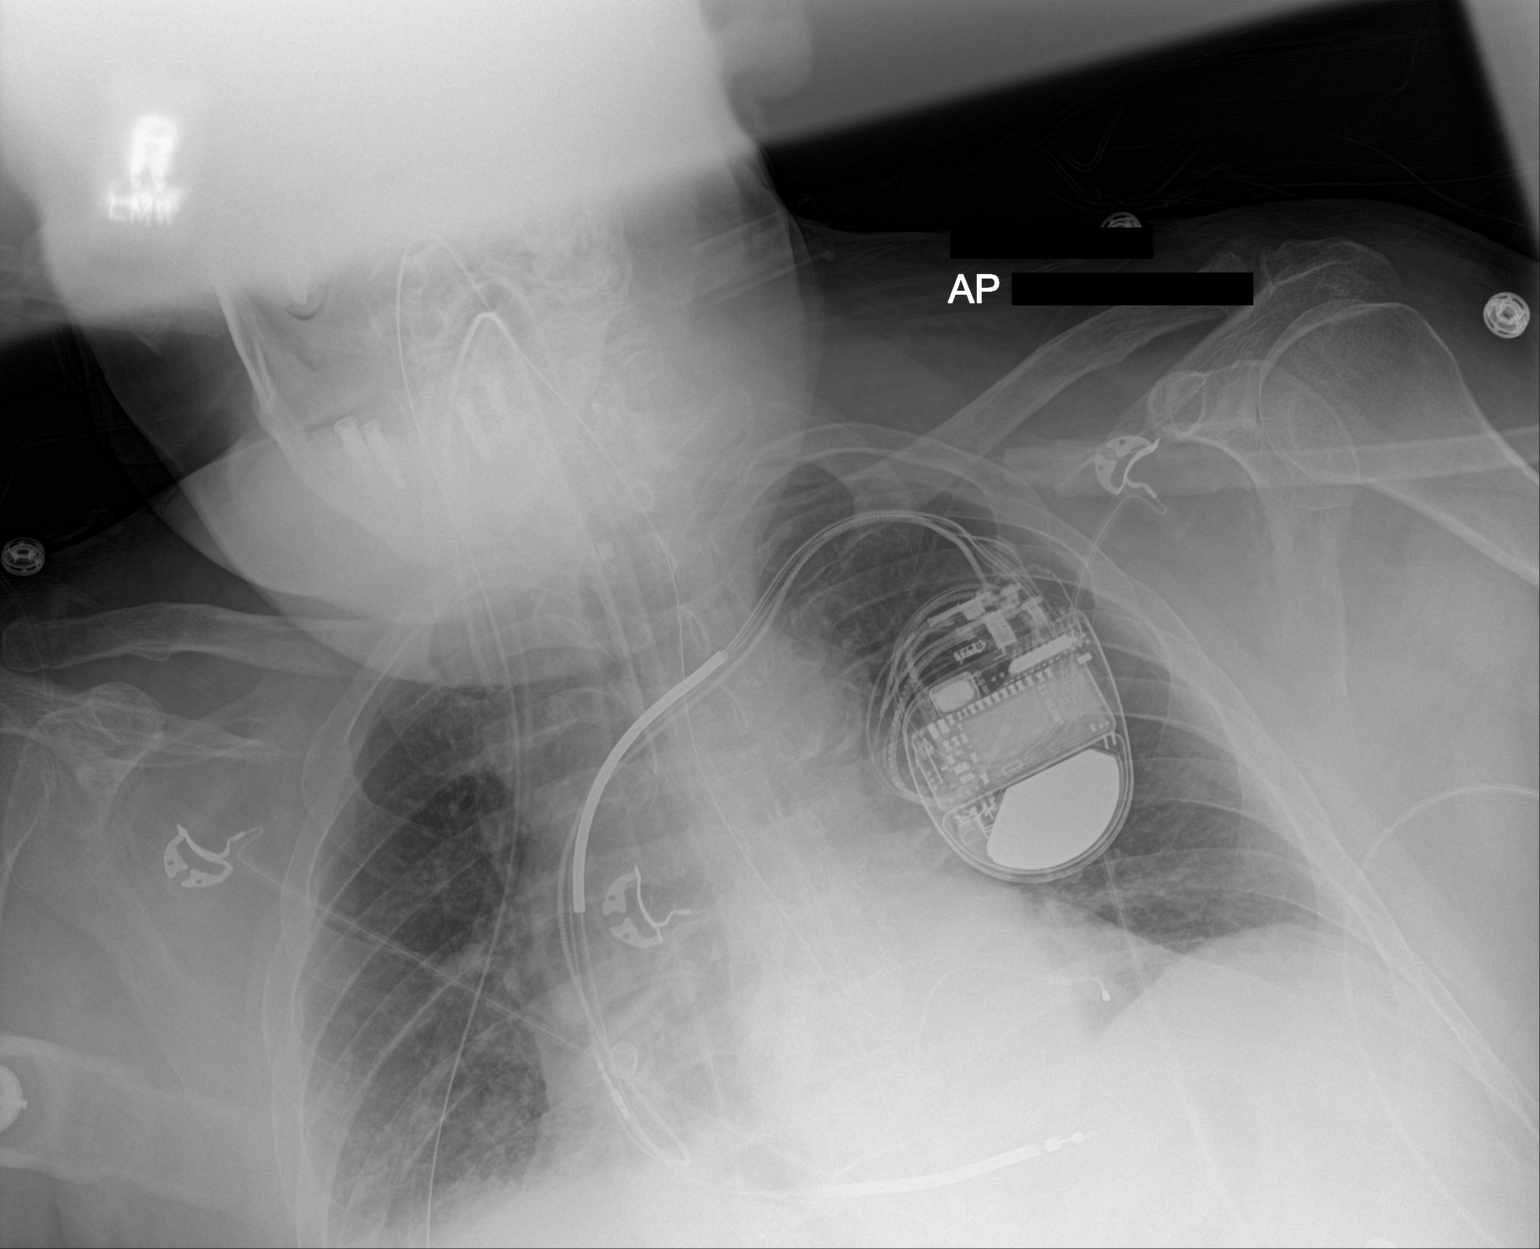
[im 2/2]
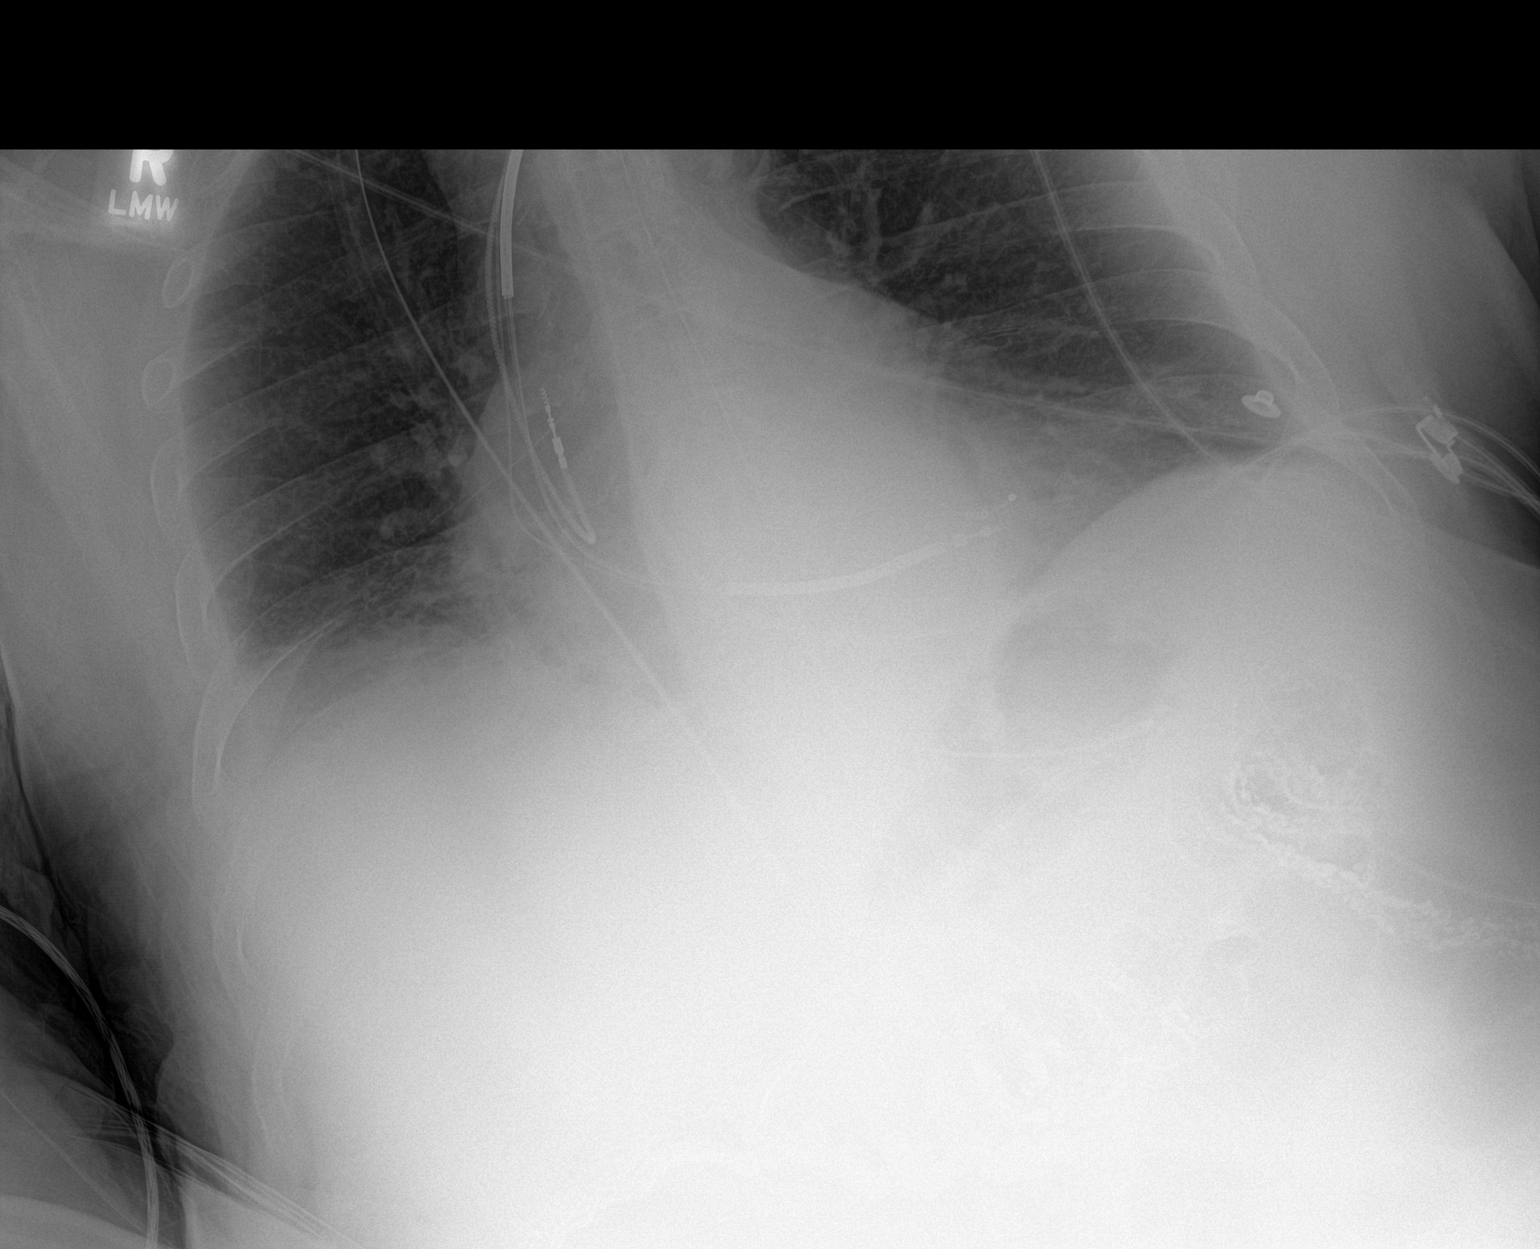

[2 of 2 positions shown; findings below may reference images not displayed]

FINDINGS: Endotracheal tube tip terminates between the clavicular heads and
carina. An nasogastric tube tip is at the distal stomach.
Biventricular ICD/pacer leads from the left, stable.

Low volumes with basilar atelectasis. No edema, effusion, or
pneumothorax. Stable cardiomegaly.
IMPRESSION: 1. Stable positioning of endotracheal and nasogastric tubes.
2. Low volumes with bibasilar atelectasis.

## 2018-04-18 IMAGING — US US ABDOMEN COMPLETE
1 series · 14 of 25 positions shown · non-contrast
Comparison: CT 04/09/2015.

CLINICAL DATA: Nausea.  Right upper quadrant tenderness.

EXAM:
ABDOMEN ULTRASOUND COMPLETE

[Series 1: us abdomen complete · 0.25mm/px · 14 of 100 slices shown]
[im 1/100]
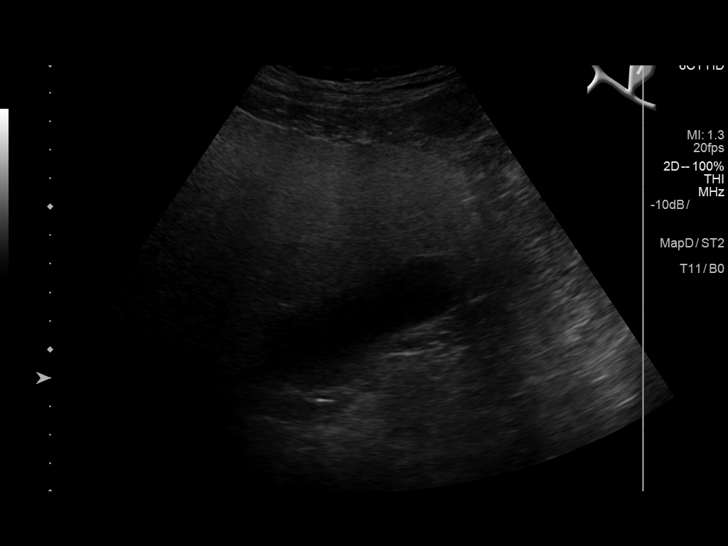
[im 9/100]
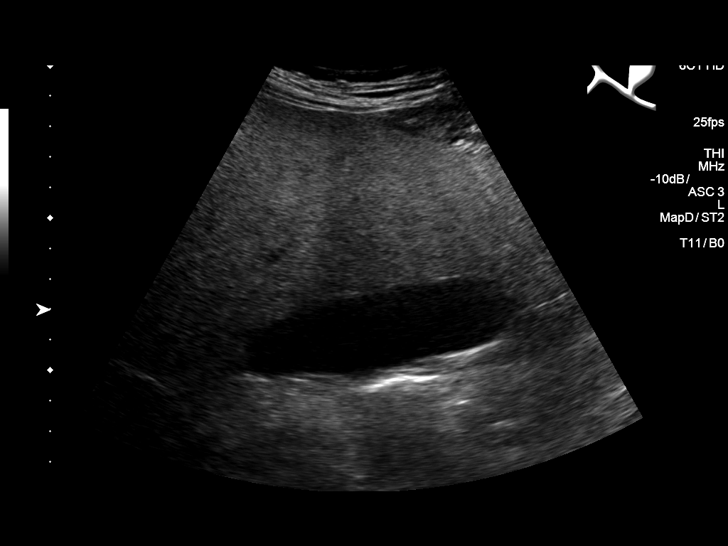
[im 17/100]
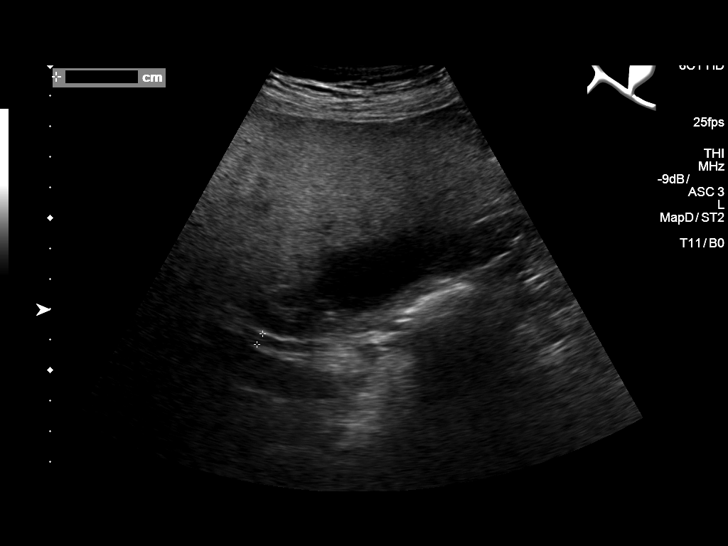
[im 25/100]
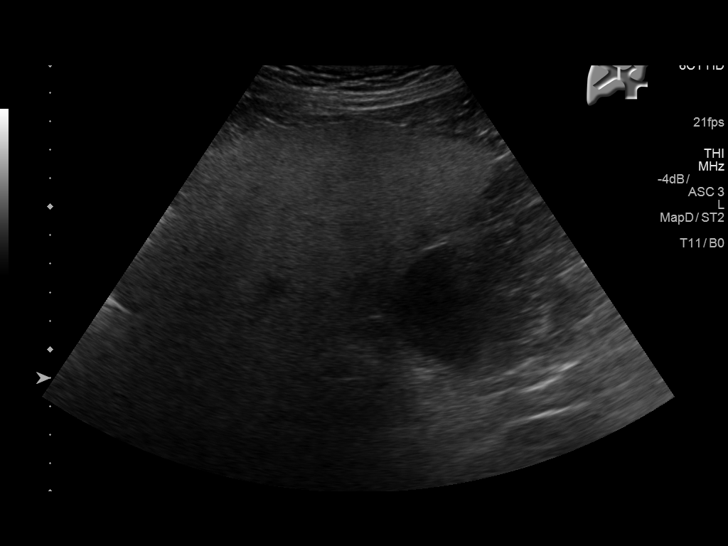
[im 34/100]
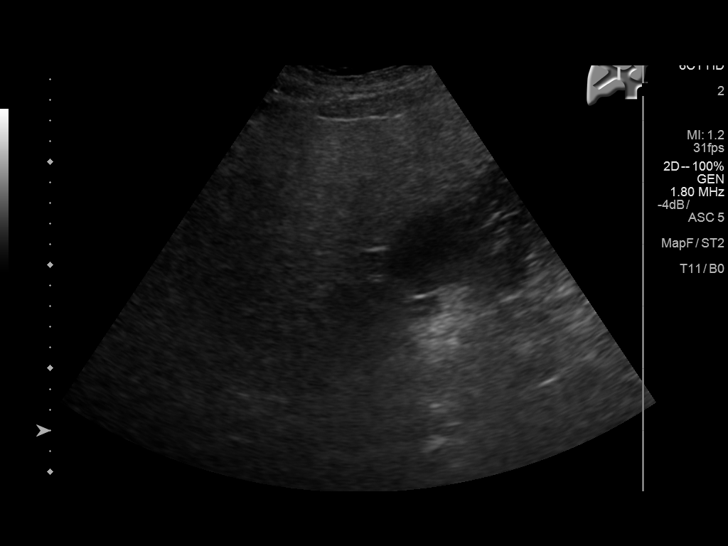
[im 38/100]
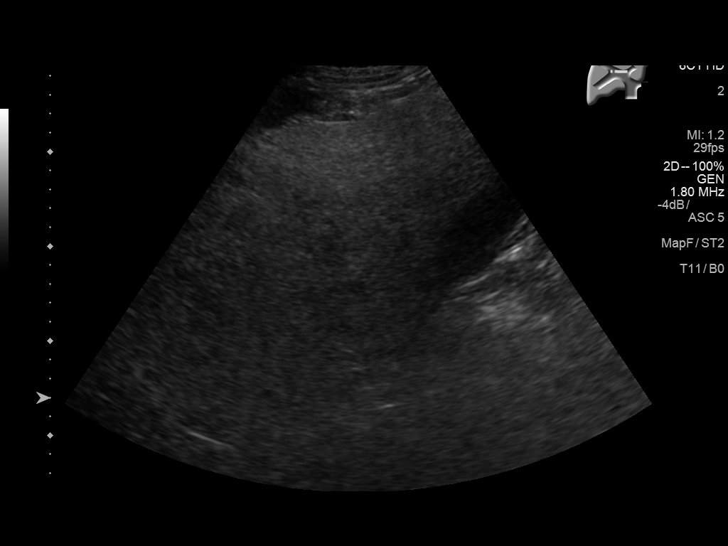
[im 46/100]
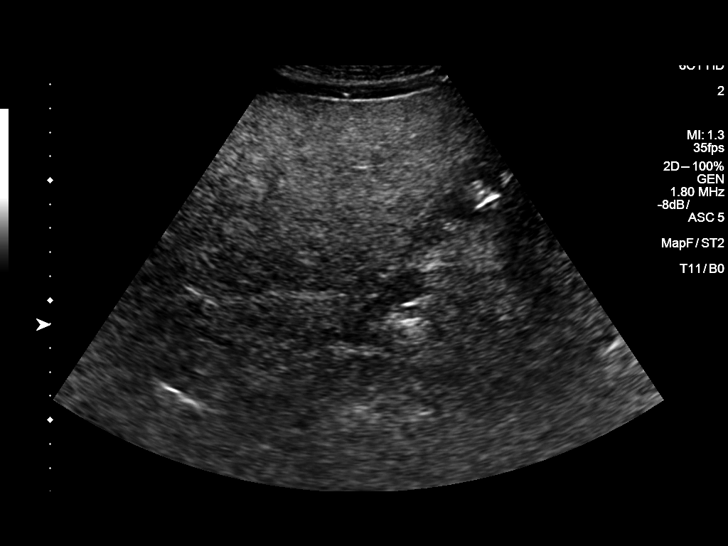
[im 54/100]
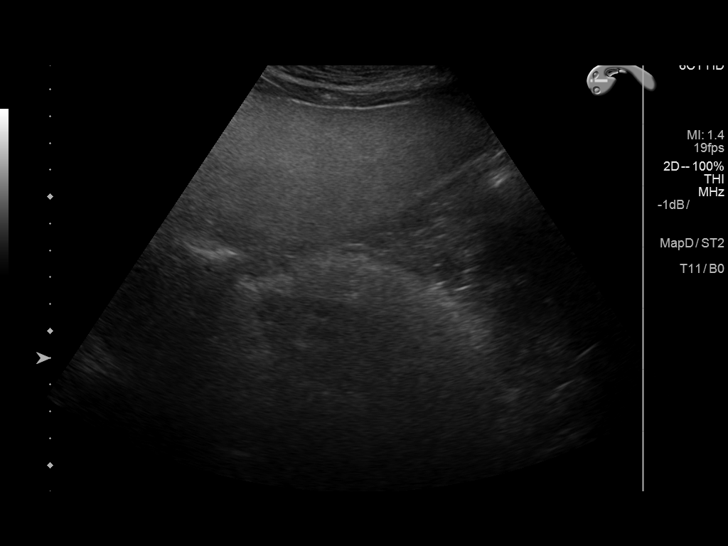
[im 62/100]
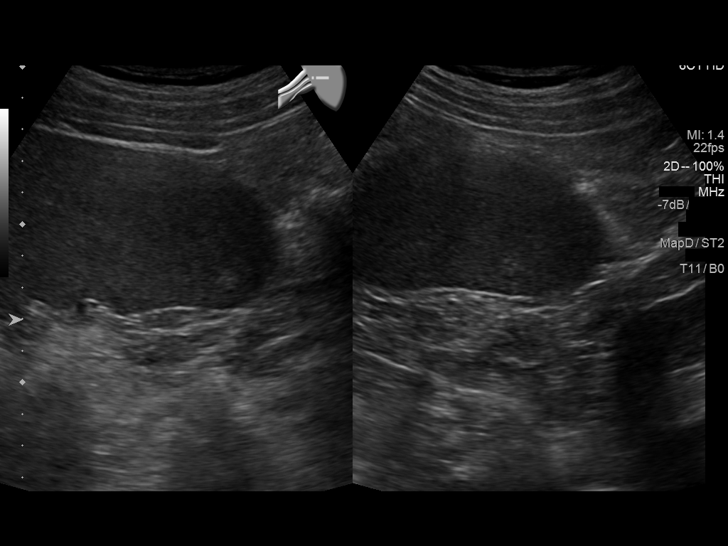
[im 67/100]
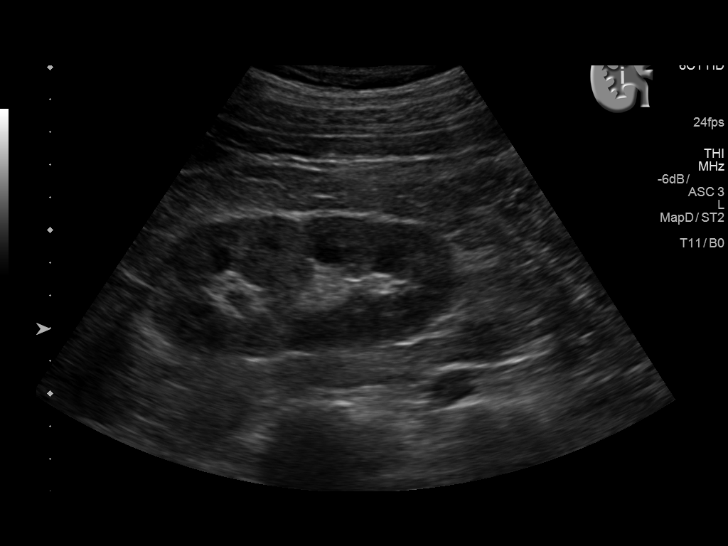
[im 75/100]
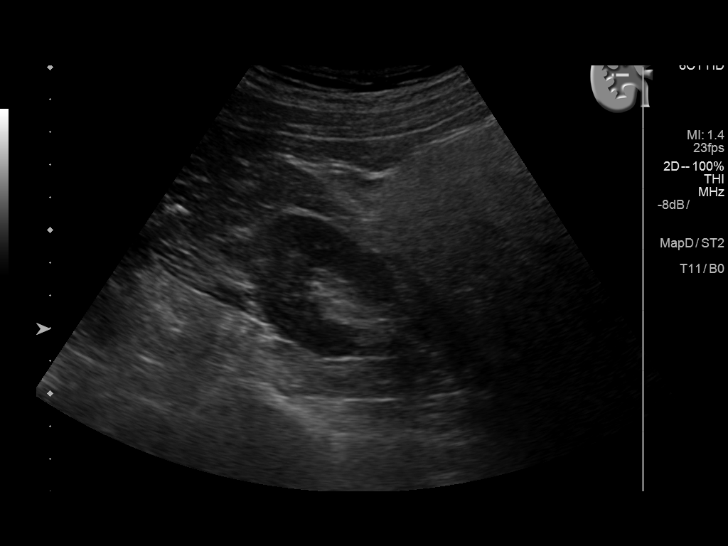
[im 83/100]
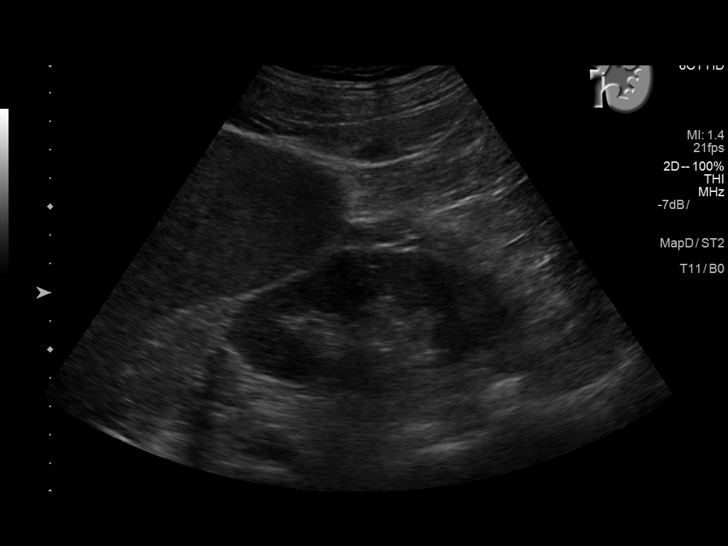
[im 91/100]
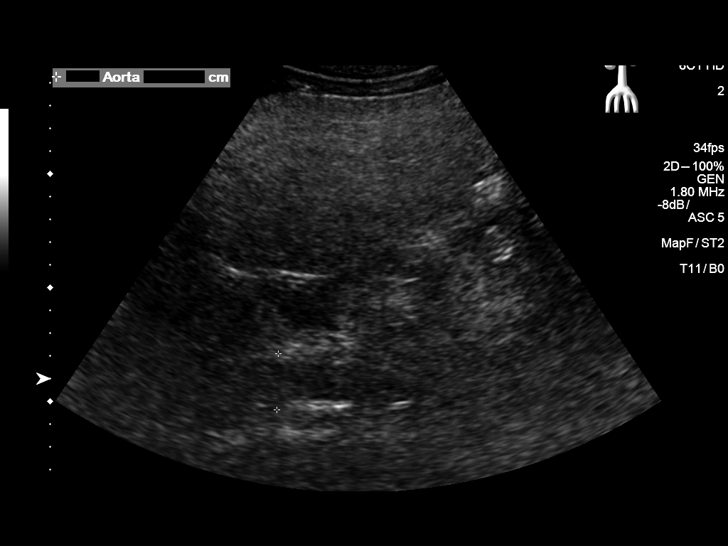
[im 100/100]
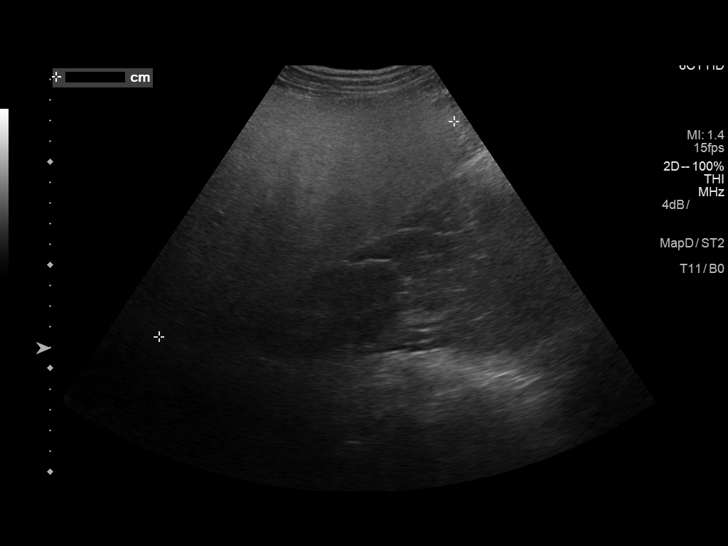

[14 of 25 positions shown; findings below may reference images not displayed]

FINDINGS: Gallbladder: No gallstones or wall thickening visualized. No
sonographic Murphy sign noted by sonographer.

Common bile duct: Diameter: 4.0 mm

Liver: There is echodense consistent fatty infiltration and/or
hepatocellular disease. Slightly nodular contour noted suggesting
cirrhosis. No focal hepatic abnormality identified.

IVC: No abnormality visualized.

Pancreas: Visualized portion unremarkable.

Spleen: Spleen measures 17.3 cm with a volume of 1,351 cc. Tiny
hyperechoic 8 mm focus is noted most likely a benign vascular lesion
.

Right Kidney: Length: 9.2 cm. Echogenicity within normal limits. No
mass or hydronephrosis visualized.

Left Kidney: Length: 11.0 cm. Echogenicity within normal limits. No
mass or hydronephrosis visualized.

Abdominal aorta: No aneurysm visualized.

Other findings: None.
IMPRESSION: 1. Liver is echodense consistent with fatty infiltration and/or
hepatocellular disease. Slightly notched contour is noted suggest
the possibility of cirrhosis .

2.  Splenomegaly.
